# Patient Record
Sex: Female | Born: 1935 | Race: White | Hispanic: No | Marital: Married | Smoking: Former smoker
Health system: Southern US, Community
[De-identification: ages and names within clinical notes are randomized; demographics above are authoritative.]

## PROBLEM LIST (undated history)

## (undated) DIAGNOSIS — I499 Cardiac arrhythmia, unspecified: Secondary | ICD-10-CM

## (undated) DIAGNOSIS — M4800 Spinal stenosis, site unspecified: Secondary | ICD-10-CM

## (undated) DIAGNOSIS — T8859XA Other complications of anesthesia, initial encounter: Secondary | ICD-10-CM

## (undated) DIAGNOSIS — I10 Essential (primary) hypertension: Secondary | ICD-10-CM

## (undated) DIAGNOSIS — T4145XA Adverse effect of unspecified anesthetic, initial encounter: Secondary | ICD-10-CM

## (undated) DIAGNOSIS — R51 Headache: Secondary | ICD-10-CM

## (undated) DIAGNOSIS — Z950 Presence of cardiac pacemaker: Secondary | ICD-10-CM

## (undated) DIAGNOSIS — M1990 Unspecified osteoarthritis, unspecified site: Secondary | ICD-10-CM

## (undated) DIAGNOSIS — C801 Malignant (primary) neoplasm, unspecified: Secondary | ICD-10-CM

## (undated) DIAGNOSIS — R001 Bradycardia, unspecified: Secondary | ICD-10-CM

## (undated) HISTORY — DX: Spinal stenosis, site unspecified: M48.00

## (undated) HISTORY — PX: ABDOMINAL HYSTERECTOMY: SHX81

## (undated) HISTORY — PX: TONSILLECTOMY: SUR1361

## (undated) HISTORY — PX: BACK SURGERY: SHX140

## (undated) HISTORY — PX: HEMORRHOID SURGERY: SHX153

## (undated) HISTORY — PX: CATARACT EXTRACTION: SUR2

## (undated) HISTORY — PX: FOOT SURGERY: SHX648

## (undated) HISTORY — PX: INSERT / REPLACE / REMOVE PACEMAKER: SUR710

## (undated) HISTORY — PX: TUBAL LIGATION: SHX77

---

## 1998-06-09 ENCOUNTER — Other Ambulatory Visit: Admission: RE | Admit: 1998-06-09 | Discharge: 1998-06-09 | Payer: Self-pay | Admitting: Podiatry

## 1999-08-05 ENCOUNTER — Other Ambulatory Visit: Admission: RE | Admit: 1999-08-05 | Discharge: 1999-08-05 | Payer: Self-pay | Admitting: *Deleted

## 1999-11-19 ENCOUNTER — Encounter: Payer: Self-pay | Admitting: Obstetrics and Gynecology

## 1999-11-19 ENCOUNTER — Encounter: Admission: RE | Admit: 1999-11-19 | Discharge: 1999-11-19 | Payer: Self-pay | Admitting: Obstetrics and Gynecology

## 2000-06-14 ENCOUNTER — Encounter: Payer: Self-pay | Admitting: Internal Medicine

## 2000-11-01 ENCOUNTER — Other Ambulatory Visit: Admission: RE | Admit: 2000-11-01 | Discharge: 2000-11-01 | Payer: Self-pay | Admitting: Obstetrics and Gynecology

## 2000-11-22 ENCOUNTER — Encounter: Admission: RE | Admit: 2000-11-22 | Discharge: 2000-11-22 | Payer: Self-pay | Admitting: Obstetrics and Gynecology

## 2000-11-22 ENCOUNTER — Encounter: Payer: Self-pay | Admitting: Obstetrics and Gynecology

## 2001-11-05 ENCOUNTER — Other Ambulatory Visit: Admission: RE | Admit: 2001-11-05 | Discharge: 2001-11-05 | Payer: Self-pay | Admitting: Obstetrics and Gynecology

## 2001-12-04 ENCOUNTER — Encounter: Payer: Self-pay | Admitting: Obstetrics and Gynecology

## 2001-12-04 ENCOUNTER — Encounter: Admission: RE | Admit: 2001-12-04 | Discharge: 2001-12-04 | Payer: Self-pay | Admitting: Obstetrics and Gynecology

## 2002-11-08 ENCOUNTER — Other Ambulatory Visit: Admission: RE | Admit: 2002-11-08 | Discharge: 2002-11-08 | Payer: Self-pay | Admitting: Obstetrics and Gynecology

## 2002-12-11 ENCOUNTER — Encounter: Payer: Self-pay | Admitting: Obstetrics and Gynecology

## 2002-12-11 ENCOUNTER — Encounter: Admission: RE | Admit: 2002-12-11 | Discharge: 2002-12-11 | Payer: Self-pay | Admitting: Obstetrics and Gynecology

## 2003-07-29 ENCOUNTER — Encounter: Payer: Self-pay | Admitting: Internal Medicine

## 2003-11-11 ENCOUNTER — Other Ambulatory Visit: Admission: RE | Admit: 2003-11-11 | Discharge: 2003-11-11 | Payer: Self-pay | Admitting: Obstetrics and Gynecology

## 2003-12-26 ENCOUNTER — Encounter: Admission: RE | Admit: 2003-12-26 | Discharge: 2003-12-26 | Payer: Self-pay | Admitting: Obstetrics and Gynecology

## 2004-07-15 ENCOUNTER — Ambulatory Visit: Payer: Self-pay | Admitting: Internal Medicine

## 2004-09-08 ENCOUNTER — Ambulatory Visit: Payer: Self-pay | Admitting: Internal Medicine

## 2004-12-28 ENCOUNTER — Encounter: Admission: RE | Admit: 2004-12-28 | Discharge: 2004-12-28 | Payer: Self-pay | Admitting: Internal Medicine

## 2005-01-06 ENCOUNTER — Ambulatory Visit: Payer: Self-pay | Admitting: Internal Medicine

## 2005-06-09 ENCOUNTER — Ambulatory Visit: Payer: Self-pay | Admitting: Internal Medicine

## 2005-09-12 ENCOUNTER — Ambulatory Visit: Payer: Self-pay | Admitting: Internal Medicine

## 2005-09-16 ENCOUNTER — Ambulatory Visit: Payer: Self-pay

## 2005-09-16 ENCOUNTER — Encounter: Payer: Self-pay | Admitting: Cardiology

## 2005-10-04 ENCOUNTER — Encounter: Admission: RE | Admit: 2005-10-04 | Discharge: 2005-10-04 | Payer: Self-pay | Admitting: Internal Medicine

## 2005-11-16 ENCOUNTER — Other Ambulatory Visit: Admission: RE | Admit: 2005-11-16 | Discharge: 2005-11-16 | Payer: Self-pay | Admitting: Obstetrics & Gynecology

## 2005-11-30 ENCOUNTER — Ambulatory Visit: Payer: Self-pay | Admitting: Internal Medicine

## 2005-12-05 ENCOUNTER — Ambulatory Visit: Payer: Self-pay | Admitting: Gastroenterology

## 2005-12-15 ENCOUNTER — Encounter: Admission: RE | Admit: 2005-12-15 | Discharge: 2005-12-15 | Payer: Self-pay | Admitting: Internal Medicine

## 2006-01-03 ENCOUNTER — Encounter: Admission: RE | Admit: 2006-01-03 | Discharge: 2006-01-03 | Payer: Self-pay | Admitting: Obstetrics & Gynecology

## 2006-08-29 HISTORY — PX: POLYPECTOMY: SHX149

## 2006-09-29 ENCOUNTER — Ambulatory Visit: Payer: Self-pay | Admitting: Internal Medicine

## 2006-09-29 LAB — CONVERTED CEMR LAB
ALT: 12 units/L (ref 0–40)
AST: 22 units/L (ref 0–37)
Albumin: 3.9 g/dL (ref 3.5–5.2)
Alkaline Phosphatase: 51 units/L (ref 39–117)
BUN: 12 mg/dL (ref 6–23)
Basophils Absolute: 0.1 10*3/uL (ref 0.0–0.1)
Basophils Relative: 0.8 % (ref 0.0–1.0)
Bilirubin, Direct: 0.1 mg/dL (ref 0.0–0.3)
CO2: 31 meq/L (ref 19–32)
Calcium: 9.5 mg/dL (ref 8.4–10.5)
Chloride: 104 meq/L (ref 96–112)
Cholesterol: 228 mg/dL (ref 0–200)
Creatinine, Ser: 0.7 mg/dL (ref 0.4–1.2)
Direct LDL: 117.5 mg/dL
Eosinophils Absolute: 0.1 10*3/uL (ref 0.0–0.6)
Eosinophils Relative: 1 % (ref 0.0–5.0)
GFR calc Af Amer: 107 mL/min
GFR calc non Af Amer: 88 mL/min
Glucose, Bld: 81 mg/dL (ref 70–99)
HCT: 39.5 % (ref 36.0–46.0)
HDL: 81.8 mg/dL (ref >39.0)
Hemoglobin: 13.8 g/dL (ref 12.0–15.0)
Lymphocytes Relative: 30.6 % (ref 12.0–46.0)
MCHC: 34.9 g/dL (ref 30.0–36.0)
MCV: 86.9 fL (ref 78.0–100.0)
Monocytes Absolute: 0.8 10*3/uL — ABNORMAL HIGH (ref 0.2–0.7)
Monocytes Relative: 6.7 % (ref 3.0–11.0)
Neutro Abs: 6.8 10*3/uL (ref 1.4–7.7)
Neutrophils Relative %: 60.9 % (ref 43.0–77.0)
Platelets: 213 10*3/uL (ref 150–400)
Potassium: 4 meq/L (ref 3.5–5.1)
RBC: 4.55 M/uL (ref 3.87–5.11)
RDW: 13 % (ref 11.5–14.6)
Sodium: 142 meq/L (ref 135–145)
TSH: 4.35 microintl units/mL (ref 0.35–5.50)
Total Bilirubin: 0.8 mg/dL (ref 0.3–1.2)
Total CHOL/HDL Ratio: 2.8
Total Protein: 6.5 g/dL (ref 6.0–8.3)
Triglycerides: 114 mg/dL (ref 0–149)
VLDL: 23 mg/dL (ref 0–40)
WBC: 11.3 10*3/uL — ABNORMAL HIGH (ref 4.5–10.5)

## 2006-10-18 ENCOUNTER — Ambulatory Visit: Payer: Self-pay | Admitting: Gastroenterology

## 2006-11-13 ENCOUNTER — Encounter: Payer: Self-pay | Admitting: Internal Medicine

## 2006-11-13 ENCOUNTER — Ambulatory Visit: Payer: Self-pay | Admitting: Gastroenterology

## 2006-11-13 ENCOUNTER — Encounter (INDEPENDENT_AMBULATORY_CARE_PROVIDER_SITE_OTHER): Payer: Self-pay | Admitting: Specialist

## 2006-11-29 ENCOUNTER — Other Ambulatory Visit: Admission: RE | Admit: 2006-11-29 | Discharge: 2006-11-29 | Payer: Self-pay | Admitting: Obstetrics & Gynecology

## 2007-01-04 DIAGNOSIS — M129 Arthropathy, unspecified: Secondary | ICD-10-CM | POA: Insufficient documentation

## 2007-01-04 DIAGNOSIS — G43009 Migraine without aura, not intractable, without status migrainosus: Secondary | ICD-10-CM | POA: Insufficient documentation

## 2007-01-04 DIAGNOSIS — N840 Polyp of corpus uteri: Secondary | ICD-10-CM | POA: Insufficient documentation

## 2007-01-04 DIAGNOSIS — Z9089 Acquired absence of other organs: Secondary | ICD-10-CM | POA: Insufficient documentation

## 2007-01-09 ENCOUNTER — Encounter: Admission: RE | Admit: 2007-01-09 | Discharge: 2007-01-09 | Payer: Self-pay | Admitting: Obstetrics & Gynecology

## 2007-01-10 ENCOUNTER — Encounter: Payer: Self-pay | Admitting: Internal Medicine

## 2007-01-10 ENCOUNTER — Ambulatory Visit: Payer: Self-pay | Admitting: Internal Medicine

## 2007-03-08 ENCOUNTER — Telehealth (INDEPENDENT_AMBULATORY_CARE_PROVIDER_SITE_OTHER): Payer: Self-pay | Admitting: *Deleted

## 2007-04-18 ENCOUNTER — Encounter: Payer: Self-pay | Admitting: Internal Medicine

## 2007-05-19 ENCOUNTER — Encounter: Payer: Self-pay | Admitting: Internal Medicine

## 2007-06-05 ENCOUNTER — Ambulatory Visit: Payer: Self-pay | Admitting: Internal Medicine

## 2007-06-05 DIAGNOSIS — R635 Abnormal weight gain: Secondary | ICD-10-CM | POA: Insufficient documentation

## 2007-06-05 DIAGNOSIS — M2550 Pain in unspecified joint: Secondary | ICD-10-CM | POA: Insufficient documentation

## 2007-06-05 LAB — CONVERTED CEMR LAB: Anti Nuclear Antibody(ANA): NEGATIVE

## 2007-06-08 ENCOUNTER — Encounter (INDEPENDENT_AMBULATORY_CARE_PROVIDER_SITE_OTHER): Payer: Self-pay | Admitting: *Deleted

## 2007-06-08 LAB — CONVERTED CEMR LAB
ALT: 18 units/L (ref 0–35)
Albumin: 4.1 g/dL (ref 3.5–5.2)
Alkaline Phosphatase: 61 units/L (ref 39–117)
Bilirubin, Direct: 0.1 mg/dL (ref 0.0–0.3)
Eosinophils Absolute: 0.2 10*3/uL (ref 0.0–0.6)
Lymphocytes Relative: 23 % (ref 12.0–46.0)
MCHC: 34.2 g/dL (ref 30.0–36.0)
MCV: 86.8 fL (ref 78.0–100.0)
Monocytes Absolute: 0.2 10*3/uL (ref 0.2–0.7)
Neutro Abs: 6.2 10*3/uL (ref 1.4–7.7)
Neutrophils Relative %: 72.2 % (ref 43.0–77.0)
Platelets: 203 10*3/uL (ref 150–400)
RBC: 4.45 M/uL (ref 3.87–5.11)
Total Bilirubin: 0.7 mg/dL (ref 0.3–1.2)
Total Protein: 7 g/dL (ref 6.0–8.3)

## 2007-07-11 ENCOUNTER — Encounter: Payer: Self-pay | Admitting: Internal Medicine

## 2007-12-07 ENCOUNTER — Other Ambulatory Visit: Admission: RE | Admit: 2007-12-07 | Discharge: 2007-12-07 | Payer: Self-pay | Admitting: Obstetrics and Gynecology

## 2007-12-10 ENCOUNTER — Ambulatory Visit: Payer: Self-pay | Admitting: Internal Medicine

## 2007-12-10 ENCOUNTER — Encounter (INDEPENDENT_AMBULATORY_CARE_PROVIDER_SITE_OTHER): Payer: Self-pay | Admitting: *Deleted

## 2007-12-10 DIAGNOSIS — R0989 Other specified symptoms and signs involving the circulatory and respiratory systems: Secondary | ICD-10-CM | POA: Insufficient documentation

## 2007-12-10 DIAGNOSIS — R0609 Other forms of dyspnea: Secondary | ICD-10-CM | POA: Insufficient documentation

## 2007-12-10 DIAGNOSIS — R609 Edema, unspecified: Secondary | ICD-10-CM | POA: Insufficient documentation

## 2007-12-11 ENCOUNTER — Ambulatory Visit: Payer: Self-pay | Admitting: Internal Medicine

## 2007-12-13 ENCOUNTER — Encounter (INDEPENDENT_AMBULATORY_CARE_PROVIDER_SITE_OTHER): Payer: Self-pay | Admitting: *Deleted

## 2007-12-17 ENCOUNTER — Encounter (INDEPENDENT_AMBULATORY_CARE_PROVIDER_SITE_OTHER): Payer: Self-pay | Admitting: *Deleted

## 2007-12-27 ENCOUNTER — Encounter: Payer: Self-pay | Admitting: Internal Medicine

## 2007-12-27 ENCOUNTER — Ambulatory Visit: Payer: Self-pay | Admitting: Cardiology

## 2007-12-27 ENCOUNTER — Ambulatory Visit: Payer: Self-pay

## 2007-12-28 ENCOUNTER — Telehealth (INDEPENDENT_AMBULATORY_CARE_PROVIDER_SITE_OTHER): Payer: Self-pay | Admitting: *Deleted

## 2007-12-28 ENCOUNTER — Encounter (INDEPENDENT_AMBULATORY_CARE_PROVIDER_SITE_OTHER): Payer: Self-pay | Admitting: *Deleted

## 2008-01-03 ENCOUNTER — Ambulatory Visit: Payer: Self-pay

## 2008-01-03 ENCOUNTER — Ambulatory Visit: Payer: Self-pay | Admitting: Cardiology

## 2008-01-03 LAB — CONVERTED CEMR LAB
Cholesterol: 227 mg/dL (ref 0–200)
Direct LDL: 128.3 mg/dL
HDL: 70.8 mg/dL (ref >39.0)
Triglycerides: 110 mg/dL (ref 0–149)

## 2008-01-10 ENCOUNTER — Encounter: Admission: RE | Admit: 2008-01-10 | Discharge: 2008-01-10 | Payer: Self-pay | Admitting: Obstetrics & Gynecology

## 2008-06-09 ENCOUNTER — Telehealth (INDEPENDENT_AMBULATORY_CARE_PROVIDER_SITE_OTHER): Payer: Self-pay | Admitting: *Deleted

## 2008-06-16 ENCOUNTER — Telehealth (INDEPENDENT_AMBULATORY_CARE_PROVIDER_SITE_OTHER): Payer: Self-pay | Admitting: *Deleted

## 2008-07-28 ENCOUNTER — Ambulatory Visit: Payer: Self-pay | Admitting: Internal Medicine

## 2008-07-28 DIAGNOSIS — I359 Nonrheumatic aortic valve disorder, unspecified: Secondary | ICD-10-CM | POA: Insufficient documentation

## 2008-07-28 DIAGNOSIS — Z860100 Personal history of colon polyps, unspecified: Secondary | ICD-10-CM | POA: Insufficient documentation

## 2008-07-28 DIAGNOSIS — I10 Essential (primary) hypertension: Secondary | ICD-10-CM | POA: Insufficient documentation

## 2008-07-28 DIAGNOSIS — I080 Rheumatic disorders of both mitral and aortic valves: Secondary | ICD-10-CM | POA: Insufficient documentation

## 2008-07-28 LAB — CONVERTED CEMR LAB: HDL goal, serum: 40 mg/dL

## 2008-07-31 ENCOUNTER — Ambulatory Visit: Payer: Self-pay | Admitting: Internal Medicine

## 2008-07-31 LAB — CONVERTED CEMR LAB
Blood in Urine, dipstick: NEGATIVE
Protein, U semiquant: NEGATIVE
Specific Gravity, Urine: 1.02
Urobilinogen, UA: 0.2
WBC Urine, dipstick: NEGATIVE
pH: 5

## 2008-08-01 ENCOUNTER — Encounter (INDEPENDENT_AMBULATORY_CARE_PROVIDER_SITE_OTHER): Payer: Self-pay | Admitting: *Deleted

## 2008-08-09 LAB — CONVERTED CEMR LAB
ALT: 13 units/L (ref 0–35)
AST: 24 units/L (ref 0–37)
Albumin: 3.9 g/dL (ref 3.5–5.2)
BUN: 23 mg/dL (ref 6–23)
CO2: 30 meq/L (ref 19–32)
Chloride: 110 meq/L (ref 96–112)
Cholesterol: 203 mg/dL (ref 0–200)
Creatinine, Ser: 0.9 mg/dL (ref 0.4–1.2)
Direct LDL: 106.5 mg/dL
GFR calc Af Amer: 80 mL/min
Glucose, Bld: 110 mg/dL — ABNORMAL HIGH (ref 70–99)
HCT: 36.7 % (ref 36.0–46.0)
Hemoglobin: 12.7 g/dL (ref 12.0–15.0)
Hgb A1c MFr Bld: 6 % (ref 4.6–6.0)
Lymphocytes Relative: 36.5 % (ref 12.0–46.0)
MCHC: 34.6 g/dL (ref 30.0–36.0)
MCV: 85.8 fL (ref 78.0–100.0)
Monocytes Relative: 8.8 % (ref 3.0–12.0)
Neutrophils Relative %: 52.8 % (ref 43.0–77.0)
RDW: 12.9 % (ref 11.5–14.6)
Sodium: 144 meq/L (ref 135–145)
TSH: 2.23 microintl units/mL (ref 0.35–5.50)
Total CHOL/HDL Ratio: 3.1
Triglycerides: 89 mg/dL (ref 0–149)

## 2008-08-11 ENCOUNTER — Encounter (INDEPENDENT_AMBULATORY_CARE_PROVIDER_SITE_OTHER): Payer: Self-pay | Admitting: *Deleted

## 2008-10-17 ENCOUNTER — Ambulatory Visit: Payer: Self-pay | Admitting: Cardiology

## 2008-10-31 ENCOUNTER — Ambulatory Visit: Payer: Self-pay | Admitting: Emergency Medicine

## 2008-12-08 ENCOUNTER — Ambulatory Visit: Payer: Self-pay | Admitting: Emergency Medicine

## 2008-12-08 DIAGNOSIS — J4489 Other specified chronic obstructive pulmonary disease: Secondary | ICD-10-CM | POA: Insufficient documentation

## 2008-12-08 DIAGNOSIS — J449 Chronic obstructive pulmonary disease, unspecified: Secondary | ICD-10-CM | POA: Insufficient documentation

## 2008-12-11 ENCOUNTER — Other Ambulatory Visit: Admission: RE | Admit: 2008-12-11 | Discharge: 2008-12-11 | Payer: Self-pay | Admitting: Obstetrics and Gynecology

## 2009-01-20 ENCOUNTER — Encounter: Admission: RE | Admit: 2009-01-20 | Discharge: 2009-01-20 | Payer: Self-pay | Admitting: Obstetrics & Gynecology

## 2009-01-21 ENCOUNTER — Ambulatory Visit: Payer: Self-pay | Admitting: Internal Medicine

## 2009-08-29 DIAGNOSIS — R001 Bradycardia, unspecified: Secondary | ICD-10-CM

## 2009-08-29 HISTORY — DX: Bradycardia, unspecified: R00.1

## 2009-09-16 ENCOUNTER — Ambulatory Visit: Payer: Self-pay | Admitting: Internal Medicine

## 2009-09-16 DIAGNOSIS — E785 Hyperlipidemia, unspecified: Secondary | ICD-10-CM | POA: Insufficient documentation

## 2009-09-16 DIAGNOSIS — R5383 Other fatigue: Secondary | ICD-10-CM

## 2009-09-16 DIAGNOSIS — R5381 Other malaise: Secondary | ICD-10-CM | POA: Insufficient documentation

## 2009-09-16 DIAGNOSIS — Z85828 Personal history of other malignant neoplasm of skin: Secondary | ICD-10-CM | POA: Insufficient documentation

## 2009-09-16 DIAGNOSIS — E039 Hypothyroidism, unspecified: Secondary | ICD-10-CM | POA: Insufficient documentation

## 2009-09-22 ENCOUNTER — Ambulatory Visit: Payer: Self-pay | Admitting: Internal Medicine

## 2009-10-05 ENCOUNTER — Encounter: Payer: Self-pay | Admitting: Internal Medicine

## 2009-10-05 ENCOUNTER — Telehealth (INDEPENDENT_AMBULATORY_CARE_PROVIDER_SITE_OTHER): Payer: Self-pay | Admitting: *Deleted

## 2009-10-06 ENCOUNTER — Encounter (INDEPENDENT_AMBULATORY_CARE_PROVIDER_SITE_OTHER): Payer: Self-pay | Admitting: *Deleted

## 2009-10-07 ENCOUNTER — Telehealth (INDEPENDENT_AMBULATORY_CARE_PROVIDER_SITE_OTHER): Payer: Self-pay | Admitting: *Deleted

## 2009-10-16 ENCOUNTER — Ambulatory Visit: Payer: Self-pay | Admitting: Internal Medicine

## 2009-10-16 LAB — CONVERTED CEMR LAB
BUN: 14 mg/dL (ref 6–23)
Basophils Relative: 0.9 % (ref 0.0–3.0)
Creatinine, Ser: 0.9 mg/dL (ref 0.4–1.2)
GFR calc non Af Amer: 65.48 mL/min (ref >60)
Glucose, Bld: 116 mg/dL — ABNORMAL HIGH (ref 70–99)
HCT: 39.8 % (ref 36.0–46.0)
Lymphocytes Relative: 32.6 % (ref 12.0–46.0)
Lymphs Abs: 2.2 10*3/uL (ref 0.7–4.0)
Monocytes Absolute: 0.6 10*3/uL (ref 0.1–1.0)
Monocytes Relative: 8.1 % (ref 3.0–12.0)
Neutro Abs: 3.9 10*3/uL (ref 1.4–7.7)
RBC: 4.43 M/uL (ref 3.87–5.11)
WBC: 6.9 10*3/uL (ref 4.5–10.5)
aPTT: 27.6 s (ref 21.7–28.8)

## 2009-10-23 ENCOUNTER — Ambulatory Visit: Payer: Self-pay | Admitting: Internal Medicine

## 2009-10-23 ENCOUNTER — Inpatient Hospital Stay (HOSPITAL_COMMUNITY): Admission: RE | Admit: 2009-10-23 | Discharge: 2009-10-24 | Payer: Self-pay | Admitting: Internal Medicine

## 2009-10-24 ENCOUNTER — Encounter: Payer: Self-pay | Admitting: Internal Medicine

## 2009-11-09 ENCOUNTER — Ambulatory Visit: Payer: Self-pay

## 2009-11-09 ENCOUNTER — Encounter: Payer: Self-pay | Admitting: Internal Medicine

## 2009-11-27 ENCOUNTER — Telehealth: Payer: Self-pay | Admitting: Internal Medicine

## 2009-12-04 ENCOUNTER — Encounter (INDEPENDENT_AMBULATORY_CARE_PROVIDER_SITE_OTHER): Payer: Self-pay | Admitting: *Deleted

## 2009-12-08 ENCOUNTER — Encounter: Payer: Self-pay | Admitting: Internal Medicine

## 2009-12-08 ENCOUNTER — Ambulatory Visit: Payer: Self-pay

## 2009-12-09 ENCOUNTER — Ambulatory Visit: Payer: Self-pay | Admitting: Gastroenterology

## 2009-12-21 ENCOUNTER — Ambulatory Visit: Payer: Self-pay | Admitting: Sports Medicine

## 2009-12-21 DIAGNOSIS — M25559 Pain in unspecified hip: Secondary | ICD-10-CM | POA: Insufficient documentation

## 2009-12-21 DIAGNOSIS — R269 Unspecified abnormalities of gait and mobility: Secondary | ICD-10-CM | POA: Insufficient documentation

## 2009-12-23 ENCOUNTER — Ambulatory Visit: Payer: Self-pay | Admitting: Gastroenterology

## 2009-12-25 ENCOUNTER — Encounter: Payer: Self-pay | Admitting: Gastroenterology

## 2010-01-01 DIAGNOSIS — Z950 Presence of cardiac pacemaker: Secondary | ICD-10-CM | POA: Insufficient documentation

## 2010-01-05 ENCOUNTER — Ambulatory Visit: Payer: Self-pay | Admitting: Internal Medicine

## 2010-01-05 DIAGNOSIS — I4892 Unspecified atrial flutter: Secondary | ICD-10-CM | POA: Insufficient documentation

## 2010-01-27 ENCOUNTER — Telehealth: Payer: Self-pay | Admitting: Cardiology

## 2010-01-28 ENCOUNTER — Telehealth: Payer: Self-pay | Admitting: Cardiology

## 2010-02-10 ENCOUNTER — Encounter: Payer: Self-pay | Admitting: Cardiology

## 2010-02-23 ENCOUNTER — Telehealth: Payer: Self-pay | Admitting: Cardiology

## 2010-02-23 ENCOUNTER — Encounter: Payer: Self-pay | Admitting: Cardiology

## 2010-03-19 ENCOUNTER — Telehealth: Payer: Self-pay | Admitting: Cardiology

## 2010-03-19 ENCOUNTER — Encounter: Payer: Self-pay | Admitting: Internal Medicine

## 2010-03-22 ENCOUNTER — Encounter: Payer: Self-pay | Admitting: Cardiology

## 2010-04-08 ENCOUNTER — Ambulatory Visit: Payer: Self-pay | Admitting: Internal Medicine

## 2010-04-08 ENCOUNTER — Telehealth: Payer: Self-pay | Admitting: Cardiology

## 2010-04-13 ENCOUNTER — Encounter: Payer: Self-pay | Admitting: Cardiology

## 2010-04-16 ENCOUNTER — Telehealth: Payer: Self-pay | Admitting: Cardiology

## 2010-04-19 ENCOUNTER — Encounter: Payer: Self-pay | Admitting: Internal Medicine

## 2010-05-24 ENCOUNTER — Ambulatory Visit: Payer: Self-pay | Admitting: Internal Medicine

## 2010-05-24 DIAGNOSIS — M1710 Unilateral primary osteoarthritis, unspecified knee: Secondary | ICD-10-CM

## 2010-05-24 DIAGNOSIS — IMO0002 Reserved for concepts with insufficient information to code with codable children: Secondary | ICD-10-CM | POA: Insufficient documentation

## 2010-05-24 DIAGNOSIS — M545 Low back pain, unspecified: Secondary | ICD-10-CM | POA: Insufficient documentation

## 2010-05-24 DIAGNOSIS — N259 Disorder resulting from impaired renal tubular function, unspecified: Secondary | ICD-10-CM | POA: Insufficient documentation

## 2010-05-26 ENCOUNTER — Encounter: Payer: Self-pay | Admitting: Internal Medicine

## 2010-05-26 ENCOUNTER — Telehealth (INDEPENDENT_AMBULATORY_CARE_PROVIDER_SITE_OTHER): Payer: Self-pay | Admitting: *Deleted

## 2010-05-26 LAB — CONVERTED CEMR LAB
AST: 26 units/L (ref 0–37)
BUN: 44 mg/dL — ABNORMAL HIGH (ref 6–23)
Basophils Absolute: 0.1 10*3/uL (ref 0.0–0.1)
Bilirubin, Direct: 0.1 mg/dL (ref 0.0–0.3)
Calcium: 10.2 mg/dL (ref 8.4–10.5)
Creatinine, Ser: 1.6 mg/dL — ABNORMAL HIGH (ref 0.4–1.2)
Eosinophils Relative: 1.8 % (ref 0.0–5.0)
Glucose, Bld: 99 mg/dL (ref 70–99)
HCT: 34.1 % — ABNORMAL LOW (ref 36.0–46.0)
Lymphocytes Relative: 30.7 % (ref 12.0–46.0)
Lymphs Abs: 3.8 10*3/uL (ref 0.7–4.0)
MCHC: 33.8 g/dL (ref 30.0–36.0)
Neutrophils Relative %: 59.9 % (ref 43.0–77.0)
Platelets: 251 10*3/uL (ref 150.0–400.0)
Potassium: 4.8 meq/L (ref 3.5–5.1)
RBC: 3.84 M/uL — ABNORMAL LOW (ref 3.87–5.11)
RDW: 13.4 % (ref 11.5–14.6)
Total Bilirubin: 0.3 mg/dL (ref 0.3–1.2)
Total Protein: 7.3 g/dL (ref 6.0–8.3)

## 2010-05-27 ENCOUNTER — Encounter: Admission: RE | Admit: 2010-05-27 | Discharge: 2010-05-27 | Payer: Self-pay | Admitting: Internal Medicine

## 2010-05-29 ENCOUNTER — Encounter: Payer: Self-pay | Admitting: Internal Medicine

## 2010-06-03 ENCOUNTER — Encounter: Payer: Self-pay | Admitting: Internal Medicine

## 2010-06-07 ENCOUNTER — Ambulatory Visit: Payer: Self-pay | Admitting: Internal Medicine

## 2010-06-08 ENCOUNTER — Encounter: Admission: RE | Admit: 2010-06-08 | Discharge: 2010-06-08 | Payer: Self-pay | Admitting: Obstetrics & Gynecology

## 2010-06-22 ENCOUNTER — Ambulatory Visit: Payer: Self-pay | Admitting: Internal Medicine

## 2010-06-22 ENCOUNTER — Encounter: Payer: Self-pay | Admitting: Cardiovascular Disease

## 2010-06-23 ENCOUNTER — Ambulatory Visit: Payer: Self-pay

## 2010-06-23 ENCOUNTER — Encounter: Payer: Self-pay | Admitting: Cardiovascular Disease

## 2010-06-23 ENCOUNTER — Encounter: Payer: Self-pay | Admitting: Internal Medicine

## 2010-07-01 ENCOUNTER — Encounter: Payer: Self-pay | Admitting: Internal Medicine

## 2010-07-08 ENCOUNTER — Encounter
Admission: RE | Admit: 2010-07-08 | Discharge: 2010-07-08 | Payer: Self-pay | Admitting: Physical Medicine and Rehabilitation

## 2010-08-10 ENCOUNTER — Encounter: Payer: Self-pay | Admitting: Internal Medicine

## 2010-08-10 ENCOUNTER — Telehealth: Payer: Self-pay | Admitting: Internal Medicine

## 2010-08-24 ENCOUNTER — Encounter: Payer: Self-pay | Admitting: Cardiovascular Disease

## 2010-09-16 ENCOUNTER — Encounter: Payer: Self-pay | Admitting: Internal Medicine

## 2010-09-20 ENCOUNTER — Ambulatory Visit
Admission: RE | Admit: 2010-09-20 | Discharge: 2010-09-20 | Payer: Self-pay | Source: Home / Self Care | Attending: Internal Medicine | Admitting: Internal Medicine

## 2010-09-22 ENCOUNTER — Other Ambulatory Visit: Payer: Self-pay | Admitting: Internal Medicine

## 2010-09-22 ENCOUNTER — Ambulatory Visit
Admission: RE | Admit: 2010-09-22 | Discharge: 2010-09-22 | Payer: Self-pay | Source: Home / Self Care | Attending: Internal Medicine | Admitting: Internal Medicine

## 2010-09-22 LAB — BASIC METABOLIC PANEL
BUN: 14 mg/dL (ref 6–23)
CO2: 30 mEq/L (ref 19–32)
Calcium: 9.9 mg/dL (ref 8.4–10.5)
Creatinine, Ser: 1 mg/dL (ref 0.4–1.2)
GFR: 60.63 mL/min (ref >60.00)
Potassium: 4.4 mEq/L (ref 3.5–5.1)
Sodium: 140 mEq/L (ref 135–145)

## 2010-09-22 LAB — HEPATIC FUNCTION PANEL
ALT: 10 U/L (ref 0–35)
AST: 21 U/L (ref 0–37)
Total Protein: 6.8 g/dL (ref 6.0–8.3)

## 2010-09-22 LAB — LIPID PANEL
HDL: 83.9 mg/dL (ref >39.00)
Triglycerides: 114 mg/dL (ref 0.0–149.0)

## 2010-09-22 LAB — CBC WITH DIFFERENTIAL/PLATELET
Eosinophils Relative: 2.1 % (ref 0.0–5.0)
HCT: 36.9 % (ref 36.0–46.0)
Lymphs Abs: 2.1 10*3/uL (ref 0.7–4.0)
Monocytes Relative: 10.3 % (ref 3.0–12.0)
Platelets: 219 10*3/uL (ref 150.0–400.0)
WBC: 7.6 10*3/uL (ref 4.5–10.5)

## 2010-09-26 LAB — CONVERTED CEMR LAB
ALT: 13 units/L (ref 0–35)
AST: 23 units/L (ref 0–37)
AST: 28 units/L (ref 0–37)
Albumin: 4.6 g/dL (ref 3.5–5.2)
Alkaline Phosphatase: 57 units/L (ref 39–117)
BUN: 23 mg/dL (ref 6–23)
Basophils Relative: 0.9 % (ref 0.0–3.0)
Bilirubin, Direct: 0.1 mg/dL (ref 0.0–0.3)
Calcium: 10.3 mg/dL (ref 8.4–10.5)
Cholesterol: 270 mg/dL — ABNORMAL HIGH (ref 0–200)
Creatinine, Ser: 0.9 mg/dL (ref 0.4–1.2)
Creatinine, Ser: 1 mg/dL (ref 0.4–1.2)
Eosinophils Relative: 1.1 % (ref 0.0–5.0)
Glucose, Bld: 110 mg/dL — ABNORMAL HIGH (ref 70–99)
HDL: 96 mg/dL (ref >39.00)
Hemoglobin: 13.7 g/dL (ref 12.0–15.0)
Hgb A1c MFr Bld: 5.8 % (ref 4.6–6.5)
Lymphs Abs: 3.1 10*3/uL (ref 0.7–4.0)
Monocytes Absolute: 0.8 10*3/uL (ref 0.1–1.0)
Monocytes Relative: 9 % (ref 3.0–12.0)
Neutrophils Relative %: 54.4 % (ref 43.0–77.0)
Platelets: 189 10*3/uL (ref 150.0–400.0)
RBC: 4.63 M/uL (ref 3.87–5.11)
Sodium: 145 meq/L (ref 135–145)
TSH: 2.32 microintl units/mL (ref 0.35–5.50)
Total Bilirubin: 1.4 mg/dL — ABNORMAL HIGH (ref 0.3–1.2)
Total Protein: 6.7 g/dL (ref 6.0–8.3)
Total Protein: 7.4 g/dL (ref 6.0–8.3)
Triglycerides: 122 mg/dL (ref 0.0–149.0)
WBC: 8.9 10*3/uL (ref 4.5–10.5)

## 2010-09-28 NOTE — Progress Notes (Signed)
Summary: can she use caffiene to help with fatigue   Phone Note Call from Patient Call back at Home Phone [phone] Call back at [phone]   Caller: Patient Reason for Call: Talk to Nurse, Lab or Test Results Initial call taken by: Lorne Skeens,  April 16, 2010 1:01 PM  Follow-up for Phone Call        pt states she has had a crappy summer related to fatigue,  thinks it is the Pradaxa, wanted to see if caffiene who help her with her fatigue so she took Excedrin and it has helped.  Wants to know if there is anything OTC she can take to help with her fatigue.  Discussed with pt that it is not a good idea to use caffiene with abnormal heart rhythm.  She also states to did a phone check for her pacemaker and hasn't heard anything back about it.  Will check with Paula/Kristen. Follow-up by: Charolotte Capuchin, RN,  April 16, 2010 1:42 PM  Additional Follow-up for Phone Call Additional follow up Details #1::        Discussed with the patient. Additional Follow-up by: Rollene Rotunda, MD, [facility],  April 20, 2010 6:15 PM

## 2010-09-28 NOTE — Medication Information (Signed)
Summary: Labs  Labs   Imported By: Debby Freiberg 02/25/2010 16:11:10  _____________________________________________________________________  External Attachment:    Type:   Image     Comment:   External Document

## 2010-09-28 NOTE — Progress Notes (Signed)
Summary: Rx for Torsemide / KCL  Medications Added TORSEMIDE 20 MG TABS (TORSEMIDE) one bid KLOR-CON M20 20 MEQ CR-TABS (POTASSIUM CHLORIDE CRYS CR) one daily      [facility] per orders, written order for BMET mail to address below.  Sander Nephew, RN ---- Converted from flag ---- ---- 01/27/2010 9:53 PM, Rollene Rotunda, MD, [facility] wrote: Will you please call in Torsemide 20 mg by mouth by mouth two times a day to [phone].  Also KCl 20 meq by mouth daily.  She needs an order for a BMET mailed to her  to be done within ten days.  Address [address] [place], [place],  [address]  Thanks.  Convert this to a saved note please.  Thanks ------------------------------       New/Updated Medications: TORSEMIDE 20 MG TABS (TORSEMIDE) one bid KLOR-CON M20 20 MEQ CR-TABS (POTASSIUM CHLORIDE CRYS CR) one daily

## 2010-09-28 NOTE — Progress Notes (Signed)
Summary: Order for TSH/Vit D   Phone Note Call from Patient Call back at [phone] / [phone]   Caller: Patient Reason for Call: Talk to Nurse, Talk to Doctor, Lab or Test Results Summary of Call: pt calling to get lab results  Initial call taken by: Omer Jack,  April 08, 2010 12:34 PM  Follow-up for Phone Call        Discussed with the patient Follow-up by: Rollene Rotunda, MD, [facility],  April 12, 2010 10:24 AM     Appended Document: pt calling to get lab results Verbal orders from Dr Antoine Poche - pt needs a TSH and VIT D level.  She will have this drawn in [place] - verbal order given to [facility].

## 2010-09-28 NOTE — Miscellaneous (Signed)
Summary: Lec previsit  Clinical Lists Changes  Medications: Added new medication of MOVIPREP 100 GM  SOLR (PEG-KCL-NACL-NASULF-NA ASC-C) As per prep instructions. - Signed Rx of MOVIPREP 100 GM  SOLR (PEG-KCL-NACL-NASULF-NA ASC-C) As per prep instructions.;  #1 x 0;  Signed;  Entered by: Karl Bales RN;  Authorized by: Mardella Layman MD [facility];  Method used: [facility] to [facility]  [phone]*, [address], [place], [place]  [address], Ph: [phone] or [phone], Fax: [phone]    Prescriptions: MOVIPREP 100 GM  SOLR (PEG-KCL-NACL-NASULF-NA ASC-C) As per prep instructions.  #1 x 0   Entered by:   Karl Bales RN   Authorized by:   Mardella Layman MD [facility]   Signed by:   Karl Bales RN on 12/09/2009   Method used:   [facility]ronically to        [facility]  [phone]* (retail)       [address] [place], [place]  [address]       Ph: [phone] or [phone]       Fax: [phone]   RxID:   [id number]

## 2010-09-28 NOTE — Progress Notes (Signed)
Summary: Lab Results  Phone Note Outgoing Call Call back at Home Phone [phone]   Call placed by: Shonna Chock CMA,  May 26, 2010 1:25 PM Call placed to: Patient Summary of Call: Spoke with patient, scheduled appointment to recheck labs 06/07/10, copy of labs mailed  Mild anemia present; this may be related to the kidney impairment which is slightly worse (creatinine was 1.35 in 02/2010).Recheck CBC & dif with iron panel, B12 & folate level in 10-14 days .White count is elevated but types of cells are normal.Please report any signs of infection. Ultrasound of kidneys & referral to Nephrologist recommended.Please stop any supplements until the Nephrology consultation is completed. Hopp (Codes for labs : 285.9, 780.79)  Shonna Chock CMA  May 26, 2010 1:33 PM n

## 2010-09-28 NOTE — Cardiovascular Report (Signed)
Summary: Office Visit   Office Visit   Imported By: Roderic Ovens 11/16/2009 15:16:52  _____________________________________________________________________  External Attachment:    Type:   Image     Comment:   External Document

## 2010-09-28 NOTE — Assessment & Plan Note (Signed)
Summary: NP,WALKING ISSUES; ORTHOTIC ADJUSTMENTS   Vital Signs:  Patient profile:   75 year old female Height:      63 inches Weight:      190 pounds BP sitting:   118 / 57  Vitals Entered By: Lillia Pauls CMA (December 21, 2009 11:22 AM)  Referring Provider:  Dr. Antoine Poche Primary Provider:  Dr. Alwyn Ren   History of Present Illness: With walking she gets pain on lat aspect of both thighs not past knee Dr Melvyn Neth, [place] has treated He has done manipulation and [place] which helps for awhile  orthotics have helped over the years  She comes for my impression of whether change in orthotic can help sxs also for other thoughts regarding hip pain  note she has had a stressful past 6 mos and is out of her normal exrecise pattern walking was better when she was able to consistently exercise  Has pacemaker for CHB Vacualr evaluations have been OK No hx of lumbar disk herniation  Allergies: 1)  ! Tylenol  Physical Exam  General:  Well-developed,well-nourished,in no acute distress; alert,appropriate and cooperative throughout examination Msk:  SLR norm bilat FABER unremarkable bilat Hip ROM is normal left and RT Hip strength is weak only on abduction and focal tenderness over glut medius MM area bilat  Feet are pronated bilat Gait shows that even in orthotics she still gets calcaneal valgus on left - this is her worst side orthtoics are well made and support the arch well however they do not have a heel counter on base   Impression & Recommendations:  Problem # 1:  HIP PAIN, BILATERAL (ICD-719.45)  Her updated medication list for this problem includes:    Bayer Aspirin 325 Mg Tabs (Aspirin) .Marland Kitchen... 1 once daily  I think this is affected by gait she does need to add some strength work for her hip abduction and hip rotation give HEP for this  Problem # 2:  ABNORMALITY OF GAIT (ICD-781.2) This is mostly corrected in current orthtoics However, we added a medial heel wedge to both this  improved her gait and lessened pronation and rear foot valgus  I also gave her some thinner felt insoles to use in dress shoes that won't tolerate custom orthotic  she can see me as needed  Can continue with Dr Melvyn Neth ofr treatment as needed  Complete Medication List: 1)  Furosemide 40 Mg Tabs (Furosemide) .Marland Kitchen.. 1 by mouth once daily and 1/2 once daily as needed 2)  Amlodipine Besylate 10 Mg Tabs (Amlodipine besylate) .... Take one tablet once daily 3)  Lisinopril 20 Mg Tabs (Lisinopril) .... Two times a day 4)  Klor-con M10 10 Meq Cr-tabs (Potassium chloride crys cr) .Marland Kitchen.. 1 once daily as needed, if extra half of lasix taken 5)  Bayer Aspirin 325 Mg Tabs (Aspirin) .Marland Kitchen.. 1 once daily 6)  Zyrtec Allergy 10 Mg Tabs (Cetirizine hcl) .... Once daily 7)  Estro Van  .... Qd 8)  Anti-oxidant Tabs (Multiple vitamin) .... Once daily 9)  Caltrate 600+d 600-400 Mg-unit Tabs (Calcium carbonate-vitamin d) .... 2 by mouth am, 1 by mouth pm 10)  Sam-e 400 Mg Tabs (S-adenosylmethionine) .Marland Kitchen.. 1 by mouth once daily 11)  Moviprep 100 Gm Solr (Peg-kcl-nacl-nasulf-na asc-c) .... As per prep instructions.

## 2010-09-28 NOTE — Letter (Signed)
Summary: Patient Notice- Polyp Results  [facility]  [address] [place], [place] [address]   Phone: [phone]  Fax: [phone]        December 25, 2009 MRN: [id number]    Amy Dominguez [address] [place], [place]  [address]    Dear [name],  I am pleased to inform you that the colon polyp(s) removed during your recent colonoscopy was (were) found to be benign (no cancer detected) upon pathologic examination.  I recommend you have a repeat colonoscopy examination in 10_ years to look for recurrent polyps, as having colon polyps increases your risk for having recurrent polyps or even colon cancer in the future.  Should you develop new or worsening symptoms of abdominal pain, bowel habit changes or bleeding from the rectum or bowels, please schedule an evaluation with either your primary care physician or with me.  Additional information/recommendations:  xx__ No further action with gastroenterology is needed at this time. Please      follow-up with your primary care physician for your other healthcare      needs.  __ Please call [phone] to schedule a return visit to review your      situation.  __ Please keep your follow-up visit as already scheduled.  __ Continue treatment plan as outlined the day of your exam.  Please call us if you are having persistent problems or have questions about your condition that have not been fully answered at this time.  Sincerely,  Mardella Layman MD [facility]  This letter has been electronically signed by your physician.  Appended Document: Patient Notice- Polyp Results letter mailed 5.2.11

## 2010-09-28 NOTE — Progress Notes (Signed)
Summary: LAB ORDER   Phone Note Call from Patient Call back at [phone]   Reason for Call: Talk to Nurse Summary of Call: NEEDS LAB ORDER SENT TO [phone], PT REQUEST CALL AFTER ORDER SENT Initial call taken by: Migdalia Dk,  February 23, 2010 2:09 PM  Follow-up for Phone Call        ORDER FAXED TO NUMBER LISTED ON MESSAGE. PT AWARE. Follow-up by: Scherrie Bateman, LPN,  February 23, 2010 2:27 PM

## 2010-09-28 NOTE — Letter (Signed)
Summary: [facility] Instructions  [facility]  [address] [place], [place] [address]   Phone: [phone]  Fax: [phone]       SAMEKA BAGENT    04-13-1936    MRN: [id number]        Procedure Day Dorna Bloom:  [facility]  12/23/09     Arrival Time:  8:30AM     Procedure Time:  9:30AM     Location of Procedure:                    _X _  [facility] (4th Floor)                        PREPARATION FOR COLONOSCOPY WITH MOVIPREP   Starting 5 days prior to your procedure 12/18/09 do not eat nuts, seeds, popcorn, corn, beans, peas,  salads, or any raw vegetables.  Do not take any fiber supplements (e.g. Metamucil, Citrucel, and Benefiber).  THE DAY BEFORE YOUR PROCEDURE         DATE: 12/22/09  DAY: TUESDAY  1.  Drink clear liquids the entire day-NO SOLID FOOD  2.  Do not drink anything colored red or purple.  Avoid juices with pulp.  No orange juice.  3.  Drink at least 64 oz. (8 glasses) of fluid/clear liquids during the day to prevent dehydration and help the prep work efficiently.  CLEAR LIQUIDS INCLUDE: Water Jello Ice Popsicles Tea (sugar ok, no milk/cream) Powdered fruit flavored drinks Coffee (sugar ok, no milk/cream) Gatorade Juice: apple, white grape, white cranberry  Lemonade Clear bullion, consomm, broth Carbonated beverages (any kind) Strained chicken noodle soup Hard Candy                             4.  In the morning, mix first dose of MoviPrep solution:    Empty 1 Pouch A and 1 Pouch B into the disposable container    Add lukewarm drinking water to the top line of the container. Mix to dissolve    Refrigerate (mixed solution should be used within 24 hrs)  5.  Begin drinking the prep at 5:00 p.m. The MoviPrep container is divided by 4 marks.   Every 15 minutes drink the solution down to the next mark (approximately 8 oz) until the full liter is complete.   6.  Follow completed prep with 16 oz of clear liquid of your choice  (Nothing red or purple).  Continue to drink clear liquids until bedtime.  7.  Before going to bed, mix second dose of MoviPrep solution:    Empty 1 Pouch A and 1 Pouch B into the disposable container    Add lukewarm drinking water to the top line of the container. Mix to dissolve    Refrigerate  THE DAY OF YOUR PROCEDURE      DATE: 12/23/09 DAY: WEDNESDAY  Beginning at 4:30AM (5 hours before procedure):         1. Every 15 minutes, drink the solution down to the next mark (approx 8 oz) until the full liter is complete.  2. Follow completed prep with 16 oz. of clear liquid of your choice.    3. You may drink clear liquids until 7:30AM (2 HOURS BEFORE PROCEDURE).   MEDICATION INSTRUCTIONS  Unless otherwise instructed, you should take regular prescription medications with a small sip of water   as early as possible the morning of  your procedure.  Hold Furosemide before coming in the day of your procedure (only hold it on 12-23-09)       OTHER INSTRUCTIONS  You will need a responsible adult at least 73 years of age to accompany you and drive you home.   This person must remain in the waiting room during your procedure.  Wear loose fitting clothing that is easily removed.  Leave jewelry and other valuables at home.  However, you may wish to bring a book to read or  an iPod/MP3 player to listen to music as you wait for your procedure to start.  Remove all body piercing jewelry and leave at home.  Total time from sign-in until discharge is approximately 2-3 hours.  You should go home directly after your procedure and rest.  You can resume normal activities the  day after your procedure.  The day of your procedure you should not:   Drive   Make legal decisions   Operate machinery   Drink alcohol   Return to work  You will receive specific instructions about eating, activities and medications before you leave.    The above instructions have been reviewed and  explained to me by   Karl Bales RN  December 09, 2009 10:26 AM     I fully understand and can verbalize these instructions _____________________________ Date _________

## 2010-09-28 NOTE — Miscellaneous (Signed)
Summary: Orders Update  Clinical Lists Changes  Medications: Rx of LISINOPRIL 20 MG TABS (LISINOPRIL) two times a day;  #180 x 3;  Signed;  Entered by: Charolotte Capuchin, RN;  Authorized by: Rollene Rotunda, MD, [facility];  Method used: Electronically to [facility]  [phone]*, [address] [place], [place]  [address], Ph: [phone] or [phone], Fax: [phone] Rx of AMLODIPINE BESYLATE 10 MG TABS (AMLODIPINE BESYLATE) take one tablet once daily;  #90 x 3;  Signed;  Entered by: Charolotte Capuchin, RN;  Authorized by: Rollene Rotunda, MD, [facility];  Method used: Electronically to [facility]  [phone]*, [address] [place], [place]  [address], Ph: [phone] or [phone], Fax: [phone] Rx of FUROSEMIDE 40 MG  TABS (FUROSEMIDE) 1 by mouth once daily and 1/2 once daily as needed;  #120 x 3;  Signed;  Entered by: Charolotte Capuchin, RN;  Authorized by: Rollene Rotunda, MD, [facility];  Method used: Electronically to [facility]  [phone]*, [address] [place], [place]  [address], Ph: [phone] or [phone], Fax: [phone]    Prescriptions: FUROSEMIDE 40 MG  TABS (FUROSEMIDE) 1 by mouth once daily and 1/2 once daily as needed  #120 x 3   Entered by:   Charolotte Capuchin, RN   Authorized by:   Rollene Rotunda, MD, [facility]   Signed by:   Charolotte Capuchin, RN on 11/06/2009   Method used:   Electronically to        [facility]  [phone]* (retail)       [address] [place], [place]  [address]       Ph: [phone] or [phone]       Fax: [phone]   RxID:   [id number] AMLODIPINE BESYLATE 10 MG TABS (AMLODIPINE BESYLATE) take one tablet once daily  #90 x 3   Entered by:   Charolotte Capuchin, RN   Authorized by:   Rollene Rotunda, MD, [facility]   Signed by:   Charolotte Capuchin, RN on 11/06/2009   Method used:   Electronically to        [facility]  [phone]* (retail)       [address] [place], [place]  [address]       Ph:  [phone] or [phone]       Fax: [phone]   RxID:   [id number] LISINOPRIL 20 MG TABS (LISINOPRIL) two times a day  #180 x 3   Entered by:   Charolotte Capuchin, RN   Authorized by:   Rollene Rotunda, MD, [facility]   Signed by:   Charolotte Capuchin, RN on 11/06/2009   Method used:   Electronically to        [facility]  [phone]* (retail)       [address] [place], [place]  [address]       Ph: [phone] or [phone]       Fax: [phone]   RxID:   [id number]

## 2010-09-28 NOTE — Assessment & Plan Note (Signed)
Summary: pc2/pacer check  Medications Added CALTRATE 600+D 600-400 MG-UNIT TABS (CALCIUM CARBONATE-VITAMIN D) 2 by mouth AM...2 tab qpm FISH OIL 1000 MG CAPS (OMEGA-3 FATTY ACIDS) 1 cap once daily PRADAXA 150 MG CAPS (DABIGATRAN ETEXILATE MESYLATE) Take 1 capsule by mouth two times a day        Visit Type:  Pacemaker check Referring Provider:  Dr. Antoine Poche Primary Provider:  Dr. Alwyn Dominguez  CC:  no cardiac complaints today.  History of Present Illness: Amy Dominguez is seen in followup for pacemaker implantation for symptomatic sinus node dysfunction. She has a history of mild aortic stenosis.  Her dyspnea is much improved. She is able to climb a flight of stairs.  her thromboembolic risk factors are noted for hypertension age and gender for CHADS VASC score of 3    Current Medications (verified): 1)  Furosemide 40 Mg  Tabs (Furosemide) .Marland Kitchen.. 1 By Mouth Once Daily and 1/2 Once Daily As Needed 2)  Amlodipine Besylate 10 Mg Tabs (Amlodipine Besylate) .... Take One Tablet Once Daily 3)  Lisinopril 20 Mg Tabs (Lisinopril) .... Two Times A Day 4)  Klor-Con M10 10 Meq Cr-Tabs (Potassium Chloride Crys Cr) .Marland Kitchen.. 1 Once Daily As Needed, If Extra Half of Lasix Taken 5)  Bayer Aspirin 325 Mg Tabs (Aspirin) .Marland Kitchen.. 1 Once Daily 6)  Zyrtec Allergy 10 Mg Tabs (Cetirizine Hcl) .... Once Daily 7)  Estro Van .... Qd 8)  Anti-Oxidant  Tabs (Multiple Vitamin) .... Once Daily 9)  Caltrate 600+d 600-400 Mg-Unit Tabs (Calcium Carbonate-Vitamin D) .... 2 By Mouth Am...2 Tab Qpm 10)  Sam-E 400 Mg Tabs (S-Adenosylmethionine) .Marland Kitchen.. 1 By Mouth Once Daily 11)  Fish Oil 1000 Mg Caps (Omega-3 Fatty Acids) .Marland Kitchen.. 1 Cap Once Daily  Allergies: 1)  ! Tylenol  Past History:  Past Medical History: Last updated: 01/01/2010 ARTHRALGIA (ICD-719.40) EDEMA- LOCALIZED (ICD-782.3) UTERINE POLYP (ICD-621.0), PMH of  COMMON MIGRAINE (ICD-346.10), PMH of Colonic polyps, hx of Hypertension Mild AS Hyperlipidemia: LDL goal =  < 160 nased on NMR Hypothyroidism Skin cancer, hx of  RUE 06/2009, Dr Retta Diones  Medtr[employer] Adapta  Vital Signs:  Patient profile:   75 year old female Height:      63 inches Weight:      193 pounds BMI:     34.31 Pulse rate:   78 / minute Pulse rhythm:   regular BP sitting:   117 / 71  (left arm) Cuff size:   large  Vitals Entered By: Amy Dominguez, CMA (Jan 05, 2010 10:06 AM)  Physical Exam  General:  The patient was alert and oriented in no acute distress. HEENT Normal.  Neck veins were flat, carotids were brisk.  Lungs were clear.  Heart sounds were regular without murmurs or gallops.  Abdomen was soft with active bowel sounds. There is no clubbing cyanosis or edema. Skin Warm and dry    PPM Specifications Following MD:  Amy Manges, MD     Referring MD:  HOPPER PPM Vendor:  Medtronic     PPM Model Number:  [id number]     PPM Serial Number:  [id number] [facility] PPM DOI:  10/23/2009     PPM Implanting MD:  Amy Manges, MD  Lead 1    Location: RA     DOI: 10/23/2009     Model #: [id number]     Serial #: [id number]     Status: active Lead 2    Location: RV     DOI: 10/23/2009     Model #:  [id number]     Serial #: [id number]     Status: active  Magnet Response Rate:  BOL 85 ERI 65  Indications:  SSS, BRADYCARDIA   PPM Follow Up Battery Voltage:  2.79 V     Battery Est. Longevity:  10.5 YRS     Pacer Dependent:  No       PPM Device Measurements Atrium  Amplitude: 2.80 mV, Impedance: 511 ohms, Threshold: 0.50 V at 0.40 msec Right Ventricle  Amplitude: 31.36 mV, Impedance: 687 ohms, Threshold: 0.50 V at 0.40 msec  Episodes MS Episodes:  4     Percent Mode Switch:  6.7%     Coumadin:  No Ventricular High Rate:  2     Atrial Pacing:  88.9%     Ventricular Pacing:  0.7%  Parameters Mode:  MVP(R)     Lower Rate Limit:  60     Upper Rate Limit:  130 Paced AV Delay:  150     Sensed AV Delay:  120 Next Remote Date:  04/08/2010     Tech Comments:  NORMAL DEVICE FUNCTION.  4 MODE  SWITCHES-LONGEST WAS 44 HRS 29 MINUTES.  CHANGED A AMPLITUDE TO 2.00 AND RV AMPLITUDE TO 2.5 V.  CARELINK CHECK 04-08-10.  Amy Dominguez  Jan 05, 2010 10:51 AM  Impression & Recommendations:  Problem # 1:  ATRIAL FLUTTER (ICD-427.32) atrial flutter was identified by her device. He has a cycle length of her possibly 200 ms. She was there for a proximally 44 hours. Neuroma blood risk factors give rise to CHADS VASC score of 3. We have discussed potential benefits of Coumadin and/or Pradaxa. She is about ready to leave on a four-month excursion. She would like to take Pradaxa as an alternative to catheter ablation which we'll pursue when she returns. We have discussed the risks of the drug particularly G disstress and GI bleeding.  Will remove her aspirin Her updated medication list for this problem includes:    Bayer Aspirin 325 Mg Tabs (Aspirin) .Marland Kitchen... 1 once daily  Problem # 2:  CARDIAC PACEMAKER IN SITU-MEDTRONIC ADAPTA (ICD-V45.01) Device parameters and data were reviewed and no changes were made  Problem # 3:  SINUS NODE DYSFUNCTION   HEART RATES<40 (ICD-427.89) stable  Her updated medication list for this problem includes:    Amlodipine Besylate 10 Mg Tabs (Amlodipine besylate) .Marland Kitchen... Take one tablet once daily    Lisinopril 20 Mg Tabs (Lisinopril) .Marland Kitchen..Marland Kitchen Two times a day    Bayer Aspirin 325 Mg Tabs (Aspirin) .Marland Kitchen... 1 once daily  Patient Instructions: 1)  Your physician has recommended you make the following change in your medication: Start Pradaxa 150mg  1 tablet twice daily. 2)  Your physician wants you to follow-up in: October with Dr Graciela Husbands.  You will receive a reminder letter in the mail two months in advance. If you don't receive a letter, please call our office to schedule the follow-up appointment. Prescriptions: PRADAXA 150 MG CAPS (DABIGATRAN ETEXILATE MESYLATE) Take 1 capsule by mouth two times a day  #60 x 11   Entered by:   Optometrist BSN   Authorized by:   Nathen May, MD, [facility]   Signed by:   Gypsy Balsam RN BSN on 01/05/2010   Method used:   Electronically to        [facility]  [phone]* (retail)       [address] [place], [place]  [address]  Ph: [phone] or [phone]       Fax: [phone]   RxID:   [id number]

## 2010-09-28 NOTE — Letter (Signed)
Summary: *Consult Note  [facility]  [address]   [place], [place] [address]   Phone: [phone]  Fax: [phone]    Re:    Amy Dominguez DOB:    08-Apr-1936 Dr. Jacqualyn Posey, D.C. [address] [place], [place] 12/21/09    Dear Dr. Melvyn Neth:    Thank you for requesting that we see the above patient for consultation.  A copy of the detailed office note will be sent under separate cover, for your review.  Evaluation today is consistent with:  1)  ABNORMALITY OF GAIT (ICD-781.2) 2)  HIP PAIN, BILATERAL (ICD-719.45)   Our recommendation is for: changes to lessen pronation in her current orthotics which are in excellent condition. Some genralized strength work for hip abduction and rotation at home. Continue to see you as needed for manipulation and therapy.   New Orders include:  1)  New Patient Level II [99202]   After today's visit, the patients current medications include: 1)  FUROSEMIDE 40 MG  TABS (FUROSEMIDE) 1 by mouth once daily and 1/2 once daily as needed 2)  AMLODIPINE BESYLATE 10 MG TABS (AMLODIPINE BESYLATE) take one tablet once daily 3)  LISINOPRIL 20 MG TABS (LISINOPRIL) two times a day 4)  KLOR-CON M10 10 MEQ CR-TABS (POTASSIUM CHLORIDE CRYS CR) 1 once daily as needed, IF EXTRA HALF OF LASIX TAKEN 5)  BAYER ASPIRIN 325 MG TABS (ASPIRIN) 1 once daily 6)  ZYRTEC ALLERGY 10 MG TABS (CETIRIZINE HCL) once daily 7)  * ESTRO VAN qd 8)  ANTI-OXIDANT  TABS (MULTIPLE VITAMIN) once daily 9)  CALTRATE 600+D 600-400 MG-UNIT TABS (CALCIUM CARBONATE-VITAMIN D) 2 by mouth AM, 1 by mouth PM 10)  SAM-E 400 MG TABS (S-ADENOSYLMETHIONINE) 1 by mouth once daily 11)  MOVIPREP 100 GM  SOLR (PEG-KCL-NACL-NASULF-NA ASC-C) As per prep instructions.   Thank you for this consultation.  If you have any further questions regarding the care of this patient, please do not hesitate to contact me @ [phone]  Thank you for this opportunity to look after your  patient.  Sincerely,  Vincent Gros MD

## 2010-09-28 NOTE — Letter (Signed)
Summary: Implantable Device Instructions  Architectural technologist, Main Office  [address]   [place], [place] [address]   Phone: [phone]  Fax: [phone]      Implantable Device Instructions  You are scheduled for:  ___x__ Permanent Transvenous Pacemaker _____ Implantable Cardioverter Defibrillator _____ Implantable Loop Recorder _____ Generator Change  on FEB. 25, 2011 with Dr. Graciela Husbands.  1.  Please arrive at the Short Stay Center at [facility] at 0600 on the day of your procedure.  2.  Do not eat or drink the night before your procedure.  3.  Complete lab work on _____.  The lab at [facility] is open from 8:30 AM to 1:30 PM and from 2:30 PM to 5:00 PM.  The lab at [facility] is open from 7:30 AM to 5:30 PM.  You do not have to be fasting.  4.  Do NOT take these medications: FUROSEMIDE prior to your procedure:  Take your remaining medications with a sip of water that morning.  5.  Plan for an overnight stay.  Bring your insurance cards and a list of your medications.  6.  Wash your chest and neck with antibacterial soap (any brand) the evening before and the morning of your procedure.  Rinse well.  7.  Education material received:     Pacemaker _x____           ICD _____           Arrhythmia _____  *If you have ANY questions after you get home, please call the office [phone].  *Every attempt is made to prevent procedures from being rescheduled.  Due to the nauture of Electrophysiology, rescheduling can happen.  The physician is always aware and directs the staff when this occurs.

## 2010-09-28 NOTE — Progress Notes (Signed)
Summary: calling regarding labs   Phone Note Call from Patient Call back at [phone]   Caller: Patient Summary of Call: Pt calling regarding labs want be at above number for another two hours so call after that per pt request. Initial call taken by: Judie Grieve,  March 19, 2010 9:05 AM  Follow-up for Phone Call        Attemted to call x2 phone sounds bussy. Ollen Gross, RN, BSN  March 19, 2010 11:29 AM  The pt needs another oder for her labs sent to a different fax #. The # is [phone]. I will fax an order for a BMP to be drawn. Follow-up by: Sherri Rad, RN, BSN,  March 19, 2010 12:41 PM  Additional Follow-up for Phone Call Additional follow up Details #1::        I attempted to fax an order to the above # the pt. had left. That # is acutally the phone # for the service center. I called them, and the fax # is [phone]. I have faxed the order there. Additional Follow-up by: Sherri Rad, RN, BSN,  March 19, 2010 12:57 PM

## 2010-09-28 NOTE — Cardiovascular Report (Signed)
Summary: Office Visit   Office Visit   Imported By: Roderic Ovens 12/16/2009 16:27:02  _____________________________________________________________________  External Attachment:    Type:   Image     Comment:   External Document

## 2010-09-28 NOTE — Procedures (Signed)
Summary: wound check/jml    Allergies: 1)  ! Tylenol  PPM Specifications Following MD:  Sherryl Manges, MD     Referring MD:  HOPPER PPM Vendor:  Medtronic     PPM Model Number:  ADDRL1     PPM Serial Number:  [id number] PPM DOI:  10/23/2009     PPM Implanting MD:  Sherryl Manges, MD  Lead 1    Location: RA     DOI: 10/23/2009     Model #: [id number]     Serial #: [id number]     Status: active Lead 2    Location: RV     DOI: 10/23/2009     Model #: [id number]     Serial #: [id number]     Status: active  Magnet Response Rate:  BOL 85 ERI 65  Indications:  SSS, BRADYCARDIA   PPM Follow Up Remote Check?  No Battery Voltage:  2.79 V     Battery Est. Longevity:  8 YEARS     Pacer Dependent:  No       PPM Device Measurements Atrium  Amplitude: PACED AT 30 mV, Impedance: 489 ohms, Threshold: 0.5 V at 0.4 msec Right Ventricle  Amplitude: 22.4 mV, Impedance: 649 ohms, Threshold: 0.625 V at 0.4 msec  Episodes MS Episodes:  6     Percent Mode Switch:  <0.1%     Coumadin:  No Ventricular High Rate:  0     Atrial Pacing:  89.3%     Ventricular Pacing:  1.6%  Parameters Mode:  MVP(R)     Lower Rate Limit:  60     Upper Rate Limit:  130 Paced AV Delay:  150     Sensed AV Delay:  120 Next Cardiology Appt Due:  01/27/2010 Tech Comments:  Wound check appt. Steri-strips removed.  Wound without redness or edema and is well healed.  Normal device function.  Pt states exercise tolerance is better s/p PPM, but still not what she wants it to be.  Explained some of this is likely related to deconditioning.  Pt requested base rate be increased.  Left base rate at 60, ADL response changed from 3-4 to see if this would help exercise tolerance.  If patient still does not think she is where she needs to be energy level wise, we can try putting her on a treadmill to adjust rate response later.  [place]  Will order [employer] box for her to take with her.  She has an appt 5-10 with SK that she will keep.  Gypsy Balsam RN BSN  November 09, 2009 2:07 PM

## 2010-09-28 NOTE — Procedures (Signed)
Summary: Cardiology Device Clinic    Allergies: 1)  ! Tylenol  PPM Specifications Following MD:  Sherryl Manges, MD     Referring MD:  HOPPER PPM Vendor:  Medtronic     PPM Model Number:  ADDRL1     PPM Serial Number:  [id number] H PPM DOI:  10/23/2009     PPM Implanting MD:  Sherryl Manges, MD  Lead 1    Location: RA     DOI: 10/23/2009     Model #: 4098     Serial #: [id number]     Status: active Lead 2    Location: RV     DOI: 10/23/2009     Model #: 6213     Serial #: [id number]     Status: active  Magnet Response Rate:  BOL 85 ERI 65  Indications:  SSS, BRADYCARDIA   PPM Follow Up Pacer Dependent:  No      Episodes Coumadin:  No  Parameters Mode:  MVP(R)     Lower Rate Limit:  60     Upper Rate Limit:  130 Paced AV Delay:  150     Sensed AV Delay:  120 Tech Comments:  GXT done with patient to try to optimize rate response.  Pt c/o labored breathing and bilateral thigh pain after about 2 blocks of walking with device implant.  Pt walked with ADL and Exertion setpoints at 4/3, 3/4, and 2/2.  Best response was at 2/2.  Pt left programmed here.  She will call back with problems before her appt in May with Dr Graciela Husbands. Gypsy Balsam RN BSN  December 09, 2009 2:13 PM

## 2010-09-28 NOTE — Medication Information (Signed)
Summary: Physician Orders   Physician Orders   Imported By: Roderic Ovens 03/25/2010 16:02:59  _____________________________________________________________________  External Attachment:    Type:   Image     Comment:   External Document

## 2010-09-28 NOTE — Letter (Signed)
Summary: [facility]  [facility]   Imported By: Lanelle Bal 07/26/2010 14:13:30  _____________________________________________________________________  External Attachment:    Type:   Image     Comment:   External Document

## 2010-09-28 NOTE — Assessment & Plan Note (Signed)
Summary: 6 month return.amber  Medications Added LISINOPRIL 20 MG TABS (LISINOPRIL) once daily TORSEMIDE 20 MG TABS (TORSEMIDE) once daily HYDROGESIC 5-500 MG CAPS (HYDROCODONE-ACETAMINOPHEN) 1 tab, two times a day      Allergies Added:   Visit Type:  PPM-Medtronic Referring Provider:  Dr. Antoine Poche Primary Provider:  Dr. Alwyn Ren  CC:  no complaints.  History of Present Illness: Amy Dominguez is seen in followup for pacemaker implantation for symptomatic sinus node dysfunction. She has a history of mild aortic stenosis.  Her dyspnea was  much improved. She is able to climb a flight of stairs still but there is more shortness of breath and exercise intolerance.  She also had ptosis of her with feeling very crummy. She is modifying her medications and there have been further medications and she is feeling a good deal better but still with some exercise intolerance  her thromboembolic risk factors are noted for hypertension age and gender for CHADS VASC score of 3    Problems Prior to Update: 1)  Low Back Pain, Chronic  (ICD-724.2) 2)  Fatigue  (ICD-780.79) 3)  Degenerative Joint Disease, Knees, Bilateral  (ICD-715.96) 4)  Renal Insufficiency  (ICD-588.9) 5)  Atrial Flutter  (ICD-427.32) 6)  Cardiac Pacemaker in Situ-medtronic Adapta  (ICD-V45.01) 7)  Abnormality of Gait  (ICD-781.2) 8)  Hip Pain, Bilateral  (ICD-719.45) 9)  Mitral Regurgitation, Mild  (ICD-396.3) 10)  Sinus Node Dysfunction Heart Rates<40  (ICD-427.89) 11)  Hyperlipidemia  (ICD-272.4) 12)  Tiredness  (ICD-780.79) 13)  Skin Cancer, Hx of  (ICD-V10.83) 14)  Hypothyroidism  (ICD-244.9) 15)  Copd, Mild  (ICD-496) 16)  Aortic Stenosis  (ICD-424.1) 17)  Hypertension  (ICD-401.9) 18)  Colonic Polyps, Hx of  (ICD-V12.72) 19)  Dyspnea On Exertion  (ICD-786.09) 20)  Edema- Localized  (ICD-782.3) 21)  Symptom, Abnormal Weight Gain  (ICD-783.1) 22)  Arthralgia  (ICD-719.40) 23)  Uterine Polyp  (ICD-621.0) 24)   Tonsillectomy and Adenoidectomy, Hx of  (ICD-V45.79) 25)  Hypokalemia  (ICD-276.8) 26)  Arthritis  (ICD-716.90) 27)  Common Migraine  (ICD-346.10)  Current Medications (verified): 1)  Lisinopril 20 Mg Tabs (Lisinopril) .... Once Daily 2)  Zyrtec Allergy 10 Mg Tabs (Cetirizine Hcl) .... Once Daily 3)  Pradaxa 150 Mg Caps (Dabigatran Etexilate Mesylate) .... Take 1 Capsule By Mouth Two Times A Day 4)  Torsemide 20 Mg Tabs (Torsemide) .... Once Daily 5)  Klor-Con M20 20 Meq Cr-Tabs (Potassium Chloride Crys Cr) .... One Daily 6)  Hydrogesic 5-500 Mg Caps (Hydrocodone-Acetaminophen) .Marland Kitchen.. 1 Tab, Two Times A Day  Allergies (verified): 1)  ! Tylenol  Past History:  Past Medical History: Last updated: 01/01/2010 ARTHRALGIA (ICD-719.40) EDEMA- LOCALIZED (ICD-782.3) UTERINE POLYP (ICD-621.0), PMH of  COMMON MIGRAINE (ICD-346.10), PMH of Colonic polyps, hx of Hypertension Mild AS Hyperlipidemia: LDL goal = < 160 nased on NMR Hypothyroidism Skin cancer, hx of  RUE 06/2009, Dr Retta Diones  Medtronic Adapta  Past Surgical History: Last updated: 01/01/2010 Tubal ligation; uterine polypectomy 1998; toe & foot surgeries; G5 P4; Colon polypectomy 2005 & 2008, Dr Jarold Motto, due 2011 Hemorrhoidectomy Tonsillectomy Medtronic Adapta  Family History: Last updated: 09/16/2009 Father: AV  disease , MI in 44s Mother: CVA, AS, MV surgery, RHD Siblings: sister breast CA 2 daughters protein S deficiency (pt has normal Protein S) DVT- 1  Daughter  Social History: Last updated: 09/16/2009 sodium restriction , no added salt  Married Alcohol use-yes: socially Retired Charity fundraiser Former smoker:quit in 2008.  "Smoked most of adult life". Pt states  that she was only a social smoker,up to 3/4-1 ppd for 30 years .   Risk Factors: Exercise: yes (12/10/2007)  Risk Factors: Smoking Status: quit (12/10/2007)  Vital Signs:  Patient profile:   75 year old female Height:      63 inches Weight:      189.75  pounds BMI:     33.73 Pulse rate:   71 / minute BP sitting:   148 / 85  (right arm) Cuff size:   regular  Vitals Entered By: Amy Dominguez CMA (June 22, 2010 12:31 PM)  Physical Exam  General:  The patient was alert and oriented in no acute distress. HEENT Normal.  Neck veins were flat, carotids were brisk.  Lungs were clear.  Heart sounds were regular without murmurs or gallops.  Abdomen was soft with active bowel sounds. There is no clubbing cyanosis or edema. Skin Warm and dry    PPM Specifications Following MD:  Sherryl Manges, MD     Referring MD:  HOPPER PPM Vendor:  Medtronic     PPM Model Number:  ADDRL1     PPM Serial Number:  [id number] PPM DOI:  10/23/2009     PPM Implanting MD:  Sherryl Manges, MD  Lead 1    Location: RA     DOI: 10/23/2009     Model #: 4098     Serial #: [id number]     Status: active Lead 2    Location: RV     DOI: 10/23/2009     Model #: 6213     Serial #: [id number]     Status: active  Magnet Response Rate:  BOL 85 ERI 65  Indications:  SSS, BRADYCARDIA   PPM Follow Up Battery Voltage:  2.80 V     Battery Est. Longevity:  13.5 yrs     Pacer Dependent:  No       PPM Device Measurements Atrium  Amplitude: PACED mV, Impedance: 519 ohms, Threshold: 0.50 V at 0.40 msec Right Ventricle  Amplitude: 31.36 mV, Impedance: 631 ohms, Threshold: 0.50 V at 0.40 msec  Episodes MS Episodes:  22     Percent Mode Switch:  <0.1%     Coumadin:  No Ventricular High Rate:  13     Atrial Pacing:  91.4%     Ventricular Pacing:  0.6%  Parameters Mode:  MVP(R)     Lower Rate Limit:  60     Upper Rate Limit:  130 Paced AV Delay:  150     Sensed AV Delay:  120 Next Remote Date:  09/23/2010     Next Cardiology Appt Due:  05/30/2011 Tech Comments:  22 MODE SWITCHES. + PRADAXA.  13 VHR EPISODES--LONGEST WAS 12 SECONDS.  NORMAL DEVICE FUNCTION.  CHANGED RA OUTPUT FROM 1.5 TO 2.0 AND RV OUTPUT FROM 2.0 TO 2.5 V.  CARELINK 09-23-10 AND ROV IN 12 MTHS W/SK. Amy Dominguez   June 22, 2010 12:40 PM  Impression & Recommendations:  Problem # 1:  ATRIAL FLUTTER (ICD-427.32) no significant intercurrent atrial arrhythmias; duration of all episodes less than a minute. We will continue Pradaxa  Problem # 2:  CARDIAC PACEMAKER IN SITU-MEDTRONIC ADAPTA (ICD-V45.01) Device parameters and data were reviewed; we reprogrammed the rate response increase in her lower rate from 60-70, increased her ADL rate from 95-100, and increasing her ADL level from 2-3. She'll let us know  Problem # 3:  SINUS NODE DYSFUNCTION   HEART RATES<40 (ICD-427.89) as above The following medications were  removed from the medication list:    Amlodipine Besylate 10 Mg Tabs (Amlodipine besylate) .Marland Kitchen... Take one tablet once daily Her updated medication list for this problem includes:    Lisinopril 20 Mg Tabs (Lisinopril) ..... Once daily  Patient Instructions: 1)  Your physician recommends that you schedule a follow-up appointment in: Feb 2012 2)  Your physician recommends that you continue on your current medications as directed. Please refer to the Current Medication list given to you today.

## 2010-09-28 NOTE — Cardiovascular Report (Signed)
Summary: Office Visit Remote   Office Visit Remote   Imported By: Roderic Ovens 04/20/2010 15:38:24  _____________________________________________________________________  External Attachment:    Type:   Image     Comment:   External Document

## 2010-09-28 NOTE — Miscellaneous (Signed)
Summary: Device preload  Clinical Lists Changes  Observations: Added new observation of MAGNET RTE: BOL 85 ERI 65 (10/23/2009 16:26) Added new observation of PPMLEADSTAT2: active (10/23/2009 16:26) Added new observation of PPMLEADSER2: [id number] (10/23/2009 16:26) Added new observation of PPMLEADMOD2: 5076  (10/23/2009 16:26) Added new observation of PPMLEADLOC2: RV  (10/23/2009 16:26) Added new observation of PPMLEADSTAT1: active  (10/23/2009 16:26) Added new observation of PPMLEADSER1: [id number]  (10/23/2009 16:26) Added new observation of PPMLEADMOD1: 5076  (10/23/2009 16:26) Added new observation of PPMLEADLOC1: RA  (10/23/2009 16:26) Added new observation of PPM IMP MD: Sherryl Manges, MD  (10/23/2009 16:26) Added new observation of PPMLEADDOI2: 10/23/2009  (10/23/2009 16:26) Added new observation of PPMLEADDOI1: 10/23/2009  (10/23/2009 16:26) Added new observation of PPM DOI: 10/23/2009  (10/23/2009 16:26) Added new observation of PPM SERL#: [id number]  (10/23/2009 16:26) Added new observation of PPM MODL#: [id number]  (10/23/2009 42:70) Added new observation of PACEMAKERMFG: Medtronic  (10/23/2009 16:26) Added new observation of PACEMAKER MD: Sherryl Manges, MD  (10/23/2009 16:26)      PPM Specifications Following MD:  Sherryl Manges, MD     PPM Vendor:  Medtronic     PPM Model Number:  ADDRL1     PPM Serial Number:  [id number] PPM DOI:  10/23/2009     PPM Implanting MD:  Sherryl Manges, MD  Lead 1    Location: RA     DOI: 10/23/2009     Model #: [id number]     Serial #: [id number]     Status: active Lead 2    Location: RV     DOI: 10/23/2009     Model #: 6948     Serial #: [id number]     Status: active  Magnet Response Rate:  BOL 85 ERI 65

## 2010-09-28 NOTE — Assessment & Plan Note (Signed)
Summary: LEG PAIN---MOSTLY IN RT LEG///SPH   Vital Signs:  Patient profile:   75 year old female Weight:      192.6 pounds Temp:     98.0 degrees F oral Pulse rate:   72 / minute Resp:     15 per minute BP sitting:   118 / 70  (left arm) Cuff size:   large  Vitals Entered By: Shonna Chock CMA (May 24, 2010 2:52 PM) CC: 1.) Leg pain since Aug-no known injury   2.) Low energy, Lower Extremity Joint pain, Fatigue   Primary Care Provider:  Dr. Alwyn Ren  CC:  1.) Leg pain since Aug-no known injury   2.) Low energy, Lower Extremity Joint pain, and Fatigue.  History of Present Illness: Lower Extremity  Pain      This is a 75 year old woman who presents with Lower Extremity  pain since 03/2010 w/o trigger.  The patient denies swelling, redness, giving away, locking, popping, stiffness for >1 hr, decreased ROM, and weakness.  The pain is located in the right knee area ( below & above).  The pain began suddenly and with no injury.  The pain is described as aching, constant, and occuring at rest, worse with ambulation. Rx: Aleve as needed, up to 4/day. The patient denies the following symptoms: fever, rash, photosensitivity, eye symptoms, diarrhea, and dysuria.  She had Chiropractry & Acupuncture  for back pain in August in [place].She wants to see Dr Ethelene Hal. Fatigue      The patient also presents with Fatigue for several months.  The patient reports persistent fatigue, fatigue even without physical exertion,  primarily physical fatigue.  The patient also reports dyspnea which is chronic & unchanged.  The patient denies night sweats, weight loss, exertional chest pain, cough, and hemoptysis.  Leg swelling has responded to Torosemide 20 mg 2X/day.  The patient denies the following symptoms: orthopnea, PND, melena, adenopathy, severe snoring, and skin changes.  The patient denies feeling depressed and sleep apnea.  TSH & vitamin D done 04/13/2010. GFR 39 & creatinine 1.35 on 07/25 in [place].  Current  Medications (verified): 1)  Amlodipine Besylate 10 Mg Tabs (Amlodipine Besylate) .... Take One Tablet Once Daily 2)  Lisinopril 20 Mg Tabs (Lisinopril) .... Two Times A Day 3)  Zyrtec Allergy 10 Mg Tabs (Cetirizine Hcl) .... Once Daily 4)  Estro Van .... Qd 5)  Anti-Oxidant  Tabs (Multiple Vitamin) .... Once Daily 6)  Caltrate 600+d 600-400 Mg-Unit Tabs (Calcium Carbonate-Vitamin D) .... 2 By Mouth Am...2 Tab Qpm 7)  Sam-E 400 Mg Tabs (S-Adenosylmethionine) .Marland Kitchen.. 1 By Mouth Once Daily 8)  Fish Oil 1000 Mg Caps (Omega-3 Fatty Acids) .Marland Kitchen.. 1 Cap Once Daily 9)  Pradaxa 150 Mg Caps (Dabigatran Etexilate Mesylate) .... Take 1 Capsule By Mouth Two Times A Day 10)  Torsemide 20 Mg Tabs (Torsemide) .... One Bid 11)  Klor-Con M20 20 Meq Cr-Tabs (Potassium Chloride Crys Cr) .... One Daily  Allergies: 1)  ! Tylenol  Physical Exam  General:  in no acute distress; alert,appropriate and cooperative throughout examination Neck:  No deformities, masses, or tenderness noted. Lungs:  Normal respiratory effort, chest expands symmetrically. Lungs are clear to auscultation, no crackles or wheezes. Heart:  bradycardia and irregular rhythm.   Abdomen:  Bowel sounds positive,abdomen soft and non-tender without masses, organomegaly or hernias noted. Msk:  Neg SLR  Extremities:  Crepitus R > L ; ? effusion Neurologic:  strength normal in all extremities and DTRs symmetrical  and normal.   Skin:  Intact without suspicious lesions or rashes Cervical Nodes:  No lymphadenopathy noted Axillary Nodes:  No palpable lymphadenopathy Psych:  memory intact for recent and remote.  Frustrated by status    Impression & Recommendations:  Problem # 1:  DEGENERATIVE JOINT DISEASE, KNEES, BILATERAL (ICD-715.96)  R > L , ? effusion on R The following medications were removed from the medication list:    Bayer Aspirin 325 Mg Tabs (Aspirin) .Marland Kitchen... 1 once daily Her updated medication list for this problem includes:    Tramadol  Hcl 50 Mg Tabs (Tramadol hcl) .Marland Kitchen... 1 every 6 hrs as needed for knee pain  Orders: Prescription Created Electronically [phone])  Problem # 2:  LOW BACK PAIN, CHRONIC (ICD-724.2)  The following medications were removed from the medication list:    Bayer Aspirin 325 Mg Tabs (Aspirin) .Marland Kitchen... 1 once daily Her updated medication list for this problem includes:    Tramadol Hcl 50 Mg Tabs (Tramadol hcl) .Marland Kitchen... 1 every 6 hrs as needed for knee pain  Orders: Misc. Referral (Misc. Ref) Prescription Created Electronically [phone])  Problem # 3:  FATIGUE (ICD-780.79)  Orders: Venipuncture (03474) TLB-CBC Platelet - w/Differential (85025-CBCD) TLB-Hepatic/Liver Function Pnl (80076-HEPATIC)  Problem # 4:  RENAL INSUFFICIENCY (ICD-588.9)  Orders: Venipuncture (25956) TLB-BMP (Basic Metabolic Panel-BMET) (80048-METABOL)  Complete Medication List: 1)  Amlodipine Besylate 10 Mg Tabs (Amlodipine besylate) .... Take one tablet once daily 2)  Lisinopril 20 Mg Tabs (Lisinopril) .... Two times a day 3)  Zyrtec Allergy 10 Mg Tabs (Cetirizine hcl) .... Once daily 4)  Estro Van  .... Qd 5)  Anti-oxidant Tabs (Multiple vitamin) .... Once daily 6)  Caltrate 600+d 600-400 Mg-unit Tabs (Calcium carbonate-vitamin d) .... 2 by mouth am...2 tab qpm 7)  Sam-e 400 Mg Tabs (S-adenosylmethionine) .Marland Kitchen.. 1 by mouth once daily 8)  Fish Oil 1000 Mg Caps (Omega-3 fatty acids) .Marland Kitchen.. 1 cap once daily 9)  Pradaxa 150 Mg Caps (Dabigatran etexilate mesylate) .... Take 1 capsule by mouth two times a day 10)  Torsemide 20 Mg Tabs (Torsemide) .... One bid 11)  Klor-con M20 20 Meq Cr-tabs (Potassium chloride crys cr) .... One daily 12)  Tramadol Hcl 50 Mg Tabs (Tramadol hcl) .Marland Kitchen.. 1 every 6 hrs as needed for knee pain  Patient Instructions: 1)  Avoid Aleve. Drink to thirst , up to 40 oz of water /day. Prescriptions: TRAMADOL HCL 50 MG TABS (TRAMADOL HCL) 1 every 6 hrs as needed for knee pain  #30 x 0   Entered and Authorized  by:   [name] MD   Signed by:   [name] MD on 05/24/2010   Method used:   Faxed to ...       [facility]  [phone]* (retail)       [address] [place], [place]  [address]       Ph: [phone] or [phone]       Fax: [phone]   RxID:   [id number]   Appended Document: LEG PAIN---MOSTLY IN RT LEG///SPH

## 2010-09-28 NOTE — Progress Notes (Signed)
Summary: pt wants to set up treadmill test   Phone Note Call from Patient Call back at Home Phone [phone]   Caller: Patient Reason for Call: Talk to Nurse, Talk to Doctor Summary of Call: pt saw you a week and half ago and you talked about setting up a treadmill test and she would like to do that  Initial call taken by: Omer Jack,  November 27, 2009 11:35 AM  Follow-up for Phone Call        GXT scheduled for Tuesday, April 12th at 3:30 to try to optimize rate response.  Patient and industry aware. Gypsy Balsam RN BSN  November 27, 2009 5:06 PM

## 2010-09-28 NOTE — Miscellaneous (Signed)
Summary: Orders Update  Clinical Lists Changes  Orders: Added new Test order of Renal Artery Duplex (Renal Artery Duplex) - Signed 

## 2010-09-28 NOTE — Procedures (Signed)
Summary: Colonoscopy  Patient: Amy Dominguez Note: All result statuses are Final unless otherwise noted.  Tests: (1) Colonoscopy (COL)   COL Colonoscopy           DONE     [facility]     [address]     [place], [place]  [address]           COLONOSCOPY PROCEDURE REPORT           PATIENT:  [name], [name]  MR#:  [id number]     BIRTHDATE:  02-Jan-1936, 73 yrs. old  GENDER:  female     ENDOSCOPIST:  Vania Rea. Jarold Motto, MD, [facility]     REF. BY:     PROCEDURE DATE:  12/23/2009     PROCEDURE:  Colonoscopy with snare polypectomy     ASA CLASS:  Class II     INDICATIONS:  Elevated Risk Screening     MEDICATIONS:   Fentanyl 50 mcg IV, Versed 5 mg IV           DESCRIPTION OF PROCEDURE:   After the risks benefits and     alternatives of the procedure were thoroughly explained, informed     consent was obtained.  Digital rectal exam was performed and     revealed perianal skin tags.   The LB [id number] endoscope     was introduced through the anus and advanced to the cecum, which     was identified by both the appendix and ileocecal valve, without     limitations.  The quality of the prep was excellent, using     MoviPrep.  The instrument was then slowly withdrawn as the colon     was fully examined.     <<PROCEDUREIMAGES>>           FINDINGS:  There were multiple polyps identified and removed. in     the sigmoid colon. SMALL1-3 MM NODULES HOT SNARE EXCISED.1     RETRIEVED FOR EXAM.  This was otherwise a normal examination of     the colon.  Melanosis coli was found.   Retroflexed views in the     rectum revealed no abnormalities.    The scope was then withdrawn     from the patient and the procedure completed.           COMPLICATIONS:  None     ENDOSCOPIC IMPRESSION:     1) Polyps, multiple in the sigmoid colon     2) Otherwise normal examination     3) Melanosis     RECOMMENDATIONS:     1) Repeat Colonoscopy in 3 years.     REPEAT EXAM:  No        ______________________________     Vania Rea. Jarold Motto, MD, [facility]           CC:  Pecola Lawless, MD           n.     Rosalie DoctorMarland Kitchen   Vania Rea. Normand Damron at 12/23/2009 10:01 AM           Herminio Heads, [id number]  Note: An exclamation mark (!) indicates a result that was not dispersed into the flowsheet. Document Creation Date: 12/23/2009 10:02 AM _______________________________________________________________________  (1) Order result status: Final Collection or observation date-time: 12/23/2009 09:54 Requested date-time:  Receipt date-time:  Reported date-time:  Referring Physician:   Ordering Physician: [name] [phone]) Specimen Source:  Source: Launa Grill Order Number: [id number] Lab site:   Appended Document:  Colonoscopy 10y f/u as needed per age  Appended Document: Colonoscopy     Procedures Next Due Date:    Colonoscopy: 11/2019

## 2010-09-28 NOTE — Cardiovascular Report (Signed)
Summary: Pre Op Orders  Pre Op Orders   Imported By: Roderic Ovens 10/27/2009 16:01:16  _____________________________________________________________________  External Attachment:    Type:   Image     Comment:   External Document

## 2010-09-28 NOTE — Assessment & Plan Note (Signed)
Summary: bradyarrythmias/per Dr hopper  Medications Added AMLODIPINE BESYLATE 10 MG TABS (AMLODIPINE BESYLATE) take one tablet once daily LISINOPRIL 20 MG TABS (LISINOPRIL) two times a day      Allergies Added:   Referring Provider:  Dr. Antoine Poche Primary Provider:  Dr. Alwyn Ren  CC:  pt seen for bradycardia.  History of Present Illness: Amy Dominguez is seen at the request of Dr. Antoine Poche for consideration of pacemaker implantation.  She is a 75 year old woman with a past medical history notable for hypertension which is mild as is her aortic stenosis, per her echo from December 2009.  She has a history of bradycardia identified at Holter monitoring previously with nocturnal rates in the 30s and has had heart rates in the 50s for the last couple of years. Over the last couple of months however she has noted progressive exercise limitations. She is short of breath climbing a flight of stairs. She is tired. She went to go see Dr. Alwyn Ren last week was found to have a heart rate of 39 which was junctional.   hemoglobin and TSH are both within normal limits.  She denies exertional chest discomfort.  she has some peripheral edema. This does not seem to correlate with her amlodipine. She does not have nocturnal dyspnea or orthopnea.  She has a history of remote syncope.  Problems Prior to Update: 1)  Mitral Regurgitation, Mild  (ICD-396.3) 2)  Bradyarrhythmia  (ICD-427.89) 3)  Hyperlipidemia  (ICD-272.4) 4)  Tiredness  (ICD-780.79) 5)  Routine General Medical Exam@health  Care Facl  (ICD-V70.0) 6)  Skin Cancer, Hx of  (ICD-V10.83) 7)  Hypothyroidism  (ICD-244.9) 8)  Copd, Mild  (ICD-496) 9)  Aortic Stenosis  (ICD-424.1) 10)  Hypertension  (ICD-401.9) 11)  Colonic Polyps, Hx of  (ICD-V12.72) 12)  Dyspnea On Exertion  (ICD-786.09) 13)  Edema- Localized  (ICD-782.3) 14)  Symptom, Abnormal Weight Gain  (ICD-783.1) 15)  Arthralgia  (ICD-719.40) 16)  Uterine Polyp  (ICD-621.0) 17)   Tonsillectomy and Adenoidectomy, Hx of  (ICD-V45.79) 18)  Hypokalemia  (ICD-276.8) 19)  Arthritis  (ICD-716.90) 20)  Common Migraine  (ICD-346.10)  Medications Prior to Update: 1)  Furosemide 40 Mg  Tabs (Furosemide) .Marland Kitchen.. 1 By Mouth Once Daily and 1/2 Once Daily As Needed 2)  Amlodipine Besylate 10 Mg Tabs (Amlodipine Besylate) .... Patient Takes 20mg  Daily 3)  Lisinopril 20 Mg Tabs (Lisinopril) .Marland Kitchen.. 1 By Mouth Once Daily 4)  Klor-Con M10 10 Meq Cr-Tabs (Potassium Chloride Crys Cr) .Marland Kitchen.. 1 Once Daily As Needed, If Extra Half of Lasix Taken 5)  Bayer Aspirin 325 Mg Tabs (Aspirin) .Marland Kitchen.. 1 Once Daily 6)  Zyrtec Allergy 10 Mg Tabs (Cetirizine Hcl) .... Once Daily 7)  Estro Van .... Qd 8)  Anti-Oxidant  Tabs (Multiple Vitamin) .... Once Daily 9)  Caltrate 600+d 600-400 Mg-Unit Tabs (Calcium Carbonate-Vitamin D) .... 2 By Mouth Am, 1 By Mouth Pm 10)  Sam-E 400 Mg Tabs (S-Adenosylmethionine) .Marland Kitchen.. 1 By Mouth Once Daily  Current Medications (verified): 1)  Furosemide 40 Mg  Tabs (Furosemide) .Marland Kitchen.. 1 By Mouth Once Daily and 1/2 Once Daily As Needed 2)  Amlodipine Besylate 10 Mg Tabs (Amlodipine Besylate) .... Take One Tablet Once Daily 3)  Lisinopril 20 Mg Tabs (Lisinopril) .... Two Times A Day 4)  Klor-Con M10 10 Meq Cr-Tabs (Potassium Chloride Crys Cr) .Marland Kitchen.. 1 Once Daily As Needed, If Extra Half of Lasix Taken 5)  Bayer Aspirin 325 Mg Tabs (Aspirin) .Marland Kitchen.. 1 Once Daily 6)  Zyrtec Allergy 10 Mg  Tabs (Cetirizine Hcl) .... Once Daily 7)  Estro Van .... Qd 8)  Anti-Oxidant  Tabs (Multiple Vitamin) .... Once Daily 9)  Caltrate 600+d 600-400 Mg-Unit Tabs (Calcium Carbonate-Vitamin D) .... 2 By Mouth Am, 1 By Mouth Pm 10)  Sam-E 400 Mg Tabs (S-Adenosylmethionine) .Marland Kitchen.. 1 By Mouth Once Daily  Allergies (verified): 1)  ! Tylenol  Past History:  Past Medical History: Last updated: 09/16/2009 ARTHRALGIA (ICD-719.40) EDEMA- LOCALIZED (ICD-782.3) UTERINE POLYP (ICD-621.0), PMH of  COMMON MIGRAINE  (ICD-346.10), PMH of Colonic polyps, hx of Hypertension Mild AS Hyperlipidemia: LDL goal = < 160 nased on NMR Hypothyroidism Skin cancer, hx of  RUE 06/2009, Dr Retta Diones   Past Surgical History: Last updated: 09/16/2009 Tubal ligation; uterine polypectomy 1998; toe & foot surgeries; G5 P4; Colon polypectomy 2005 & 2008, Dr Jarold Motto, due 2011 Hemorrhoidectomy Tonsillectomy  Family History: Last updated: 09/16/2009 Father: AV  disease , MI in 59s Mother: CVA, AS, MV surgery, RHD Siblings: sister breast CA 2 daughters protein S deficiency (pt has normal Protein S) DVT- 1  Daughter  Social History: Last updated: 09/16/2009 sodium restriction , no added salt  Married Alcohol use-yes: socially Retired Charity fundraiser Former smoker:quit in 2008.  "Smoked most of adult life". Pt states that she was only a social smoker,up to 3/4-1 ppd for 30 years .   Review of Systems       full review of systems was negative apart from a history of present illness and past medical history    Vital Signs:  Patient profile:   75 year old female Height:      62.25 inches Weight:      190 pounds BMI:     34.60 Pulse rate:   52 / minute Pulse rhythm:   regular BP sitting:   132 / 60  (left arm) Cuff size:   large  Vitals Entered By: Amy Dominguez CMA (September 22, 2009 8:46 AM)  Physical Exam  General:  Alert and oriented older Caucasian female appearing her stated age in no acute distress. HEENT  normal . Neck veins were flat; carotids brisk and full without bruits. No lymphadenopathy. Back without kyphosis. Lungs clear. Heart sounds regular but slow  PMI nondisplaced. Abdomen soft with active bowel sounds without midline pulsation or hepatomegaly. Femoral pulses and distal pulses intact. Extremities were without clubbing cyanosis or edemaSkin warm and dry. Neurological exam grossly normal affect is engaging   Impression & Recommendations:  Problem # 1:  SINUS NODE DYSFUNCTION   HEART RATES<40  (ICD-427.89) The patient has symptomatic sinus node dysfunction. She notes fatigue and impaired exercise performance. He is taking no drugs which are likely causing this. Metabolic parameters are also normal. We have discussed the potential benefits as well as risks of pacemaker implantation including but not limited to death perforation of heart or lung, lead dislodgment and infection. We also discussed interactions including MRI. They understand agree and are willing to proceed. Her updated medication list for this problem includes:    Amlodipine Besylate 10 Mg Tabs (Amlodipine besylate) .Marland Kitchen... Take one tablet once daily    Lisinopril 20 Mg Tabs (Lisinopril) .Marland Kitchen..Marland Kitchen Two times a day    Bayer Aspirin 325 Mg Tabs (Aspirin) .Marland Kitchen... 1 once daily  Problem # 2:  DYSPNEA ON EXERTION (ICD-786.09) I suspect that this is related to the above Her updated medication list for this problem includes:    Furosemide 40 Mg Tabs (Furosemide) .Marland Kitchen... 1 by mouth once daily and 1/2 once daily as needed  Amlodipine Besylate 10 Mg Tabs (Amlodipine besylate) .Marland Kitchen... Take one tablet once daily    Lisinopril 20 Mg Tabs (Lisinopril) .Marland Kitchen..Marland Kitchen Two times a day    Bayer Aspirin 325 Mg Tabs (Aspirin) .Marland Kitchen... 1 once daily  Patient Instructions: 1)  Your physician has recommended that you have a pacemaker inserted.  A pacemaker is a small device that is placed under the skin of your chest or abdomen to help control abnormal heart rhythms. This device uses electrical pulses to prompt the heart to beat at a normal rate. Pacemakers are used to treat heart rhythms that are too slow. Wires (leads) are attached to the pacemaker that goes into the chambers of your heart. This is done in the hospital and usually requires an overnight stay. Please see the instruction sheet given to you today for more information.

## 2010-09-28 NOTE — Consult Note (Signed)
Summary: [facility]  [facility]   Imported By: Lanelle Bal 06/09/2010 13:11:36  _____________________________________________________________________  External Attachment:    Type:   Image     Comment:   External Document

## 2010-09-28 NOTE — Progress Notes (Signed)
Summary:  Discuss Instructions   Phone Note Outgoing Call   Call placed by: Duncan Dull, RN, BSN,  October 07, 2009 11:25 AM Call placed to: Patient Summary of Call: Called patient and left message on machine  To discuss implant instructions, date and time, etc Duncan Dull, RN, BSN  October 07, 2009 11:25 AM   Follow-up for Phone Call        Pt aware and will come for labs tomorrow  Follow-up by: Duncan Dull, RN, BSN,  October 15, 2009 11:44 AM

## 2010-09-28 NOTE — Progress Notes (Signed)
Summary: pt wants to adjust meds   Phone Note Call from Patient Call back at [phone]   Caller: Patient Reason for Call: Talk to Nurse, Talk to Doctor Summary of Call: pt wants to talk about adjusting meds. she dosen't feel her lasix is working for her  Initial call taken by: Omer Jack,  January 27, 2010 12:55 PM  Follow-up for Phone Call        Spoke with pt. Pt. states is taken Lasix 40 mg  daily and Dr. Antoine Poche  said she could take an extra 40 mg the later part of the day. Pt. states she has done that several times and it is  not working Pt. states her ankles swell faster than before when she stands up. Pt. would like for Dr. Antoine Poche to call her asap. Ollen Gross, RN, BSN  January 27, 2010 1:11 PM   Additional Follow-up for Phone Call Additional follow up Details #1::        Discussed with patient.  Order given. Additional Follow-up by: Rollene Rotunda, MD, [facility],  January 27, 2010 9:54 PM

## 2010-09-28 NOTE — Cardiovascular Report (Signed)
Summary: Office Visit   Office Visit   Imported By: Roderic Ovens 01/11/2010 16:33:40  _____________________________________________________________________  External Attachment:    Type:   Image     Comment:   External Document

## 2010-09-28 NOTE — Letter (Signed)
Summary: Colonoscopy Letter  [facility]  [address] [place], [place] [address]   Phone: [phone]  Fax: [phone]      October 06, 2009 MRN: [id number]   Amy Dominguez [address] [place], [place]  [address]   Dear Amy Dominguez,   According to your medical record, it is time for you to schedule a Colonoscopy. The American Cancer Society recommends this procedure as a method to detect early colon cancer. Patients with a family history of colon cancer, or a personal history of colon polyps or inflammatory bowel disease are at increased risk.  This letter has beeen generated based on the recommendations made at the time of your procedure. If you feel that in your particular situation this may no longer apply, please contact our office.  Please call our office at [phone] to schedule this appointment or to update your records at your earliest convenience.  Thank you for cooperating with [place] to provide you with the very best care possible.   Sincerely,  Vania Rea. Jarold Motto, M.D.  [facility] [phone]

## 2010-09-28 NOTE — Assessment & Plan Note (Signed)
Summary: pt fasting/nta   Vital Signs:  Patient profile:   75 year old female Height:      62.25 inches Weight:      191 pounds BMI:     34.78 Temp:     98.0 degrees F oral Pulse rate:   39 / minute Resp:     14 per minute BP sitting:   126 / 80  (left arm) Cuff size:   large  Vitals Entered By: Shonna Chock (September 16, 2009 8:35 AM) CC: CPX with fasting labs  Comments REVIEWED MED LIST, PATIENT AGREED DOSE AND INSTRUCTION CORRECT    Primary Care Provider:  Dr. Alwyn Ren  CC:  CPX with fasting labs .  History of Present Illness: Amy Dominguez is here for a physical; she describes decreased stamina gradually over past  3-4 months . Heart rate chronically slow; formerly in 50s ,then occasionally in 40s . See EKG ; profound nodal bradycardia with rate of 39.She is not on Beta blocker; on  Amlodipine ,CCB. Slow rate "present before Amlodipine". PMH of pulse in 30s on Holter 2007?  Allergies: 1)  ! Tylenol  Past History:  Past Medical History: ARTHRALGIA (ICD-719.40) EDEMA- LOCALIZED (ICD-782.3) UTERINE POLYP (ICD-621.0), PMH of  COMMON MIGRAINE (ICD-346.10), PMH of Colonic polyps, hx of Hypertension Mild AS Hyperlipidemia: LDL goal = < 160 nased on NMR Hypothyroidism Skin cancer, hx of  RUE 06/2009, Dr Retta Diones   Past Surgical History: Tubal ligation; uterine polypectomy 1998; toe & foot surgeries; G5 P4; Colon polypectomy 2005 & 2008, Dr Jarold Motto, due 2011 Hemorrhoidectomy Tonsillectomy  Family History: Father: AV  disease , MI in 77s Mother: CVA, AS, MV surgery, RHD Siblings: sister breast CA 2 daughters protein S deficiency (pt has normal Protein S) DVT- 1  Daughter  Social History: sodium restriction , no added salt  Married Alcohol use-yes: socially Retired Charity fundraiser Former smoker:quit in 2008.  "Smoked most of adult life". Pt states that she was only a social smoker,up to 3/4-1 ppd for 30 years .   Review of Systems General:  Denies chills, fever, sweats, and  weight loss. Eyes:  Denies blurring, double vision, and vision loss-both eyes. ENT:  Complains of postnasal drainage; denies decreased hearing, difficulty swallowing, and hoarseness. CV:  Complains of shortness of breath with exertion and swelling of feet; denies chest pain or discomfort, difficulty breathing at night, difficulty breathing while lying down, leg cramps with exertion, lightheadness, near fainting, and palpitations. Resp:  Denies cough, coughing up blood, shortness of breath, sputum productive, and wheezing. GI:  Denies abdominal pain, bloody stools, dark tarry stools, and indigestion. GU:  Denies discharge, dysuria, and hematuria. MS:  Complains of joint pain and low back pain; denies joint redness, joint swelling, mid back pain, and thoracic pain; Aleve & exercises help. Derm:  Denies changes in nail beds, dryness, lesion(s), and rash. Neuro:  Denies brief paralysis, disturbances in coordination, numbness, poor balance, tingling, tremors, and weakness. Psych:  Denies anxiety and depression. Endo:  Denies cold intolerance, excessive hunger, excessive thirst, excessive urination, and heat intolerance. Heme:  Denies abnormal bruising and bleeding. Allergy:  Denies itching eyes and sneezing.  Physical Exam  General:  well-nourished,in no acute distress; alert,appropriate and cooperative throughout examination Head:  Normocephalic and atraumatic without obvious abnormalities. No apparent alopecia Eyes:  No corneal or conjunctival inflammation noted. Perrla. Funduscopic exam benign, without hemorrhages, exudates or papilledema. Ears:  External ear exam shows no significant lesions or deformities.  Otoscopic examination reveals clear canals,  tympanic membranes are intact bilaterally without bulging, retraction, inflammation or discharge. Hearing is grossly normal bilaterally. Nose:  External nasal examination shows no deformity or inflammation. Nasal mucosa are pink and moist without  lesions or exudates. Mouth:  Oral mucosa and oropharynx without lesions or exudates.  Teeth in good repair. Neck:  No deformities, masses, or tenderness noted. Lungs:  Normal respiratory effort, chest expands symmetrically. Lungs are clear to auscultation, no crackles or wheezes. Heart:  regular rhythm, bradycardia, and grade 1 basilar   /6 systolic murmur.  Carotid radiation vs bilateral bruits Abdomen:  Bowel sounds positive,abdomen soft and non-tender without masses, organomegaly or hernias noted. Genitalia:  Dr Hyacinth Meeker Msk:  No deformity or scoliosis noted of thoracic or lumbar spine.   Pulses:  R and L carotid,radial,femoral,dorsalis pedis and posterior tibial pulses are full and equal bilaterally Extremities:  No clubbing, cyanosis, edema. Minor OA finger deformities  noted with normal full range of motion of all joints.  Mild crepitus of knees Neurologic:  alert & oriented X3 and DTRs symmetrical and normal.   Skin:  Intact without suspicious lesions or rashes. R deltoid op scar Cervical Nodes:  No lymphadenopathy noted Axillary Nodes:  No palpable lymphadenopathy Psych:  memory intact for recent and remote, normally interactive, and good eye contact.     Impression & Recommendations:  Problem # 1:  ROUTINE GENERAL MEDICAL EXAM@HEALTH  CARE FACL (ICD-V70.0)  Orders: EKG w/ Interpretation (93000) Venipuncture (30865) TLB-Lipid Panel (80061-LIPID) TLB-BMP (Basic Metabolic Panel-BMET) (80048-METABOL) TLB-CBC Platelet - w/Differential (85025-CBCD) TLB-Hepatic/Liver Function Pnl (80076-HEPATIC) TLB-TSH (Thyroid Stimulating Hormone) (84443-TSH) TLB-T4 (Thyrox), Free [phone])  Problem # 2:  TIREDNESS (ICD-780.79)  probably due to #3  Orders: Venipuncture (28413) TLB-CBC Platelet - w/Differential (85025-CBCD) TLB-TSH (Thyroid Stimulating Hormone) (84443-TSH) TLB-T4 (Thyrox), Free [phone]) Cardiology Referral (Cardiology)  Problem # 3:  BRADYARRHYTHMIA  (ICD-427.89)  Nodal bradycardia Her updated medication list for this problem includes:    Bayer Aspirin 325 Mg Tabs (Aspirin) .Marland Kitchen... 1 once daily  Orders: EKG w/ Interpretation (93000) Cardiology Referral (Cardiology)  Problem # 4:  HYPOTHYROIDISM (ICD-244.9)  PMH of   Orders: Venipuncture (53664)  Problem # 5:  HYPERLIPIDEMIA (ICD-272.4)  Orders: Venipuncture (40347)  Problem # 6:  HYPERTENSION (ICD-401.9) Controlled Her updated medication list for this problem includes:    Furosemide 40 Mg Tabs (Furosemide) .Marland Kitchen... 1 by mouth once daily and 1/2 once daily as needed    Amlodipine Besylate 10 Mg Tabs (Amlodipine besylate) .Marland Kitchen... Patient takes 20mg  daily    Lisinopril 20 Mg Tabs (Lisinopril) .Marland Kitchen... 1 by mouth once daily  Orders: EKG w/ Interpretation (93000) Venipuncture (42595)  Complete Medication List: 1)  Furosemide 40 Mg Tabs (Furosemide) .Marland Kitchen.. 1 by mouth once daily and 1/2 once daily as needed 2)  Amlodipine Besylate 10 Mg Tabs (Amlodipine besylate) .... Patient takes 20mg  daily 3)  Lisinopril 20 Mg Tabs (Lisinopril) .Marland Kitchen.. 1 by mouth once daily 4)  Klor-con M10 10 Meq Cr-tabs (Potassium chloride crys cr) .Marland Kitchen.. 1 once daily as needed, if extra half of lasix taken 5)  Bayer Aspirin 325 Mg Tabs (Aspirin) .Marland Kitchen.. 1 once daily 6)  Zyrtec Allergy 10 Mg Tabs (Cetirizine hcl) .... Once daily 7)  Estro Van  .... Qd 8)  Anti-oxidant Tabs (Multiple vitamin) .... Once daily 9)  Caltrate 600+d 600-400 Mg-unit Tabs (Calcium carbonate-vitamin d) .... 2 by mouth am, 1 by mouth pm 10)  Sam-e 400 Mg Tabs (S-adenosylmethionine) .Marland Kitchen.. 1 by mouth once daily  Patient Instructions: 1)  Consume LESS  THAN 25 grams of sugar/ day from LABELED foods & drinks. Your LDL goal = < 160 Prescriptions: FUROSEMIDE 40 MG  TABS (FUROSEMIDE) 1 by mouth once daily and 1/2 once daily as needed  #90 x 3   Entered and Authorized by:   [name] MD   Signed by:   [name] MD on 09/16/2009   Method used:    Print then Give to Patient   RxID:   [id number] AMLODIPINE BESYLATE 10 MG TABS (AMLODIPINE BESYLATE) PATIENT TAKES 20MG  DAILY  #90 x 3   Entered and Authorized by:   [name] MD   Signed by:   [name] MD on 09/16/2009   Method used:   Print then Give to Patient   RxID:   [id number] LISINOPRIL 20 MG TABS (LISINOPRIL) 1 by mouth once daily  #180 x 3   Entered and Authorized by:   [name] MD   Signed by:   [name] MD on 09/16/2009   Method used:   Print then Give to Patient   RxID:   [id number]

## 2010-09-28 NOTE — Cardiovascular Report (Signed)
Summary: Office Visit   Office Visit   Imported By: Roderic Ovens 07/01/2010 16:12:31  _____________________________________________________________________  External Attachment:    Type:   Image     Comment:   External Document

## 2010-09-28 NOTE — Consult Note (Signed)
Summary: [facility]  [facility]   Imported By: Lanelle Bal 06/22/2010 10:57:23  _____________________________________________________________________  External Attachment:    Type:   Image     Comment:   External Document

## 2010-09-28 NOTE — Progress Notes (Signed)
Summary:  PPM Implant instructions   Phone Note Outgoing Call   Call placed by: Duncan Dull, RN, BSN,  October 05, 2009 11:40 AM Call placed to: Patient Summary of Call: Called patient and left message on machine for pt to call me back To discuss procedure date, time and instructions Duncan Dull, RN, BSN  October 05, 2009 11:40 AM

## 2010-09-28 NOTE — Letter (Signed)
Summary: Remote Device Check  [facility], Main Office  [address]   [place], [place] [address]   Phone: [phone]  Fax: [phone]     April 19, 2010 MRN: [id number]   Amy Dominguez [address] [place], [place]  [address]   Dear Ms. Shehan,   Your remote transmission was recieved and reviewed by your physician.  All diagnostics were within normal limits for you.   __X____Your next office visit is scheduled for:   October with Dr Graciela Husbands. Please call our office to schedule an appointment.    Sincerely,  Vella Kohler

## 2010-09-28 NOTE — Miscellaneous (Signed)
Summary: Orders Update   Clinical Lists Changes  Orders: Added new Referral order of Radiology Referral (Radiology) - Signed Added new Referral order of Nephrology Referral (Nephro) - Signed

## 2010-09-30 ENCOUNTER — Encounter: Payer: Self-pay | Admitting: Internal Medicine

## 2010-09-30 ENCOUNTER — Ambulatory Visit: Admit: 2010-09-30 | Payer: Self-pay | Admitting: Internal Medicine

## 2010-09-30 NOTE — Progress Notes (Signed)
Summary: calling about medication   Phone Note Call from Patient Call back at Home Phone [phone] Call back at [phone]   Caller: Patient Summary of Call: Pt calling regarding medication,informed pt that nusre will call her later on today Initial call taken by: Judie Grieve,  August 10, 2010 1:38 PM  Follow-up for Phone Call        pt wants to have injections into spinal column due to spinal stenosis by Dr. Ethelene Hal at [facility] orth[facility]ics. Dr. Ethelene Hal wants to know if she can be off pradaxa for five days before hand. the procedure is scheduled for 08/17/10.  Follow-up by: Claris Gladden RN,  August 10, 2010 6:33 PM     Appended Document: calling about medication left msg to adv pt ok to be off pradaxa for 5 days. Claris Gladden, RN, BSN

## 2010-09-30 NOTE — Letter (Signed)
Summary: [facility]  [facility]   Imported By: Lanelle Bal 08/25/2010 07:48:07  _____________________________________________________________________  External Attachment:    Type:   Image     Comment:   External Document

## 2010-09-30 NOTE — Assessment & Plan Note (Signed)
Summary: EST //YEARLY//PH   Vital Signs:  Patient profile:   75 year old female Height:      52 inches Weight:      188.6 pounds BMI:     49.22 Temp:     98.2 degrees F oral Pulse rate:   76 / minute Resp:     14 per minute BP sitting:   118 / 78  (left arm) Cuff size:   large  Vitals Entered By: Shonna Chock CMA (September 20, 2010 8:25 AM) CC: CPX-? labs (patient had 1/2 of a sandwich). Heart followed by Cardiologist (patient with pacemaker)  Vision Screening:      Vision Comments: Patient states she gets regular eye exams by Dr.Miller 1-2 x yearly   Primary Care Provider:  Dr. Alwyn Ren  CC:  CPX-? labs (patient had 1/2 of a sandwich). Heart followed by Cardiologist (patient with pacemaker).  History of Present Illness: Here for Medicare AWV: 1.Risk factors based on Past M, S, F history:see Diagnoses; chart updated 2.Physical Activities: no program yet due to knees(DJD) 3.Depression/mood: no issues 4.Hearing:whisper heard @ 6 ft 5.ADL's:no limitations  6.Fall Risk: no issues 7.Home Safety:home safety proofed  8.Height, weight, &visual acuity:see VS, Dr Blima Ledger seen every 6-12 months 9.Counseling: POA & Living Will in place 10.Labs ordered based on risk factors: see Orders 11.Referral Coordination: BMD ? due; Colonoscopy & mammograms.  12.Care Plan: see Instructions 13.Cognitive Assessment: Oriented X 3 ;  memory & recall  intact   ; "WORLD " spelled   backwards; mood & affect normal.   Preventive Screening-Counseling & Management  Alcohol-Tobacco     Alcohol drinks/day: <1     Smoking Status: current     Packs/Day: 1/10      Pack years: 30 pack years  Caffeine-Diet-Exercise     Caffeine use/day: 2-3 cups of 1/2 & 1/2  Hep-HIV-STD-Contraception     Dental Visit-last 6 months yes     Sun Exposure-Excessive: no  Safety-Violence-Falls     Seat Belt Use: yes     Smoke Detectors: yes      Blood Transfusions:  no.        Travel History:  04/2010 [place].     Current Medications (verified): 1)  Lisinopril 20 Mg Tabs (Lisinopril) .... Once Daily 2)  Zyrtec Allergy 10 Mg Tabs (Cetirizine Hcl) .... Once Daily 3)  Pradaxa 150 Mg Caps (Dabigatran Etexilate Mesylate) .... Take 1 Capsule By Mouth Two Times A Day 4)  Torsemide 20 Mg Tabs (Torsemide) .... Once Daily 5)  Klor-Con M20 20 Meq Cr-Tabs (Potassium Chloride Crys Cr) .... One Daily 6)  Hydrogesic 5-500 Mg Caps (Hydrocodone-Acetaminophen) .Marland Kitchen.. 1 Tab, Two Times A Day As Needed 7)  Sam-E 400 Mg Tabs (S-Adenosylmethionine) .Marland Kitchen.. 1 By Mouth Once Daily 8)  Caltrate 600+d 600-400 Mg-Unit Tabs (Calcium Carbonate-Vitamin D) .Marland Kitchen.. 1 By Mouth Once Daily 9)  Estrovan .Marland Kitchen.. 1 By Mouth Once Daily  Allergies: 1)  ! Tylenol  Past History:  Past Medical History: ARTHRALGIA (ICD-719.40) UTERINE POLYP (ICD-621.0), PMH of  COMMON MIGRAINE (ICD-346.10), PMH of Colonic polyps,PMH  of, Hyperplastic & Adenomatous Hypertension Mild Aortic Stenosis, mild Mitral Regurgitation on ECHO Hyperlipidemia: LDL goal = < 160 nased on NMR Lipoprofile  Hypothyroidism, PMH of Skin cancer, PMH  of  RUE, ? squamous cel , 06/2009, Dr [name]  Medtronic Adapta; Spinal Stenosis LS spine   Past Surgical History: Tubal ligation; uterine polypectomy 1998; toe & foot surgeries; G5 P 4; Colon polypectomy 2005 &  2008, Dr Jarold Motto,   negative  2011, due 2021 Hemorrhoidectomy Tonsillectomy Medtronic Adapta  Family History: Father: AV  disease , MI in 27s Mother: CVA, AS, MV surgery, RHD Siblings: sister: breast cancer 2 daughters: protein S deficiency ([name]  has normal Protein S) ;  Daughter: DVT  Social History: no added salt  Married Alcohol use-yes: socially Retired Charity fundraiser Smoking Status:  current Packs/Day:  1/10  Caffeine use/day:  2-3 cups of 1/2 & 1/2 Dental Care w/in 6 mos.:  yes Sun Exposure-Excessive:  no Seat Belt Use:  yes Blood Transfusions:  no  Review of Systems       The patient complains of  dyspnea on exertion.  The patient denies anorexia, fever, weight loss, weight gain, vision loss, decreased hearing, hoarseness, chest pain, syncope, prolonged cough, hemoptysis, abdominal pain, melena, hematochezia, severe indigestion/heartburn, hematuria, suspicious skin lesions, unusual weight change, abnormal bleeding, enlarged lymph nodes, and angioedema.         Dr Arlyn Leak has released her fom  ongoing monitor by Nephrology; monitor will continue through this office.  Physical Exam  General:  well-nourished;alert,appropriate and cooperative throughout examination Head:  Normocephalic and atraumatic without obvious abnormalities. No apparent alopecia or balding. Eyes:  No corneal or conjunctival inflammation noted.  Perrla. Funduscopic exam benign, without hemorrhages, exudates or papilledema.  Ears:  External ear exam shows no significant lesions or deformities.  Otoscopic examination reveals clear canals, tympanic membranes are intact bilaterally without bulging, retraction, inflammation or discharge. Hearing is grossly normal bilaterally. Nose:  External nasal examination shows no deformity or inflammation. Nasal mucosa are pink and moist without lesions or exudates. Mouth:  Oral mucosa and oropharynx without lesions or exudates.  Teeth in good repair. Neck:  No deformities, masses, or tenderness noted. Physiologic asymmetry of thyroid Breasts:  Dr Hyacinth Meeker Lungs:  Normal respiratory effort, chest expands symmetrically. Lungs are clear to auscultation, no crackles or wheezes. Heart:  normal rate, regular rhythm, no gallop, no rub, no JVD, no HJR, and grade 1 /6 systolic murmur @ R base.   Abdomen:  Bowel sounds positive,abdomen soft and non-tender without masses, organomegaly or hernias noted. Rectal:  S/P colonoscopy  Genitalia:  [name], Gyn Pulses:  R and L carotid,radial,dorsalis pedis and posterior tibial pulses are full and equal bilaterally Extremities:  No clubbing, cyanosis,  edema, or deformity noted with normal full range of motion of all joints.  Crepitus of knees; OA of DIP joints  Neurologic:  alert & oriented X3, strength normal in all extremities, and DTRs symmetrical and normal.   Skin:  Intact without suspicious lesions or rashes Cervical Nodes:  No lymphadenopathy noted Axillary Nodes:  No palpable lymphadenopathy Psych:  memory intact for recent and remote, normally interactive, good eye contact, and not anxious appearing.     Impression & Recommendations:  Problem # 1:  PREVENTIVE HEALTH CARE (ICD-V70.0)  Orders: Medicare -1st Annual Wellness Visit [phone])  Problem # 2:  HYPOTHYROIDISM (ICD-244.9) PMH of  Problem # 3:  RENAL INSUFFICIENCY (ICD-588.9) PMH of  Problem # 4:  HYPERLIPIDEMIA (ICD-272.4)  Problem # 5:  HYPERTENSION (ICD-401.9)  Her updated medication list for this problem includes:    Lisinopril 20 Mg Tabs (Lisinopril) ..... Once daily    Torsemide 20 Mg Tabs (Torsemide) ..... Once daily  Complete Medication List: 1)  Lisinopril 20 Mg Tabs (Lisinopril) .... Once daily 2)  Zyrtec Allergy 10 Mg Tabs (Cetirizine hcl) .... Once daily 3)  Pradaxa 150 Mg Caps (Dabigatran etexilate mesylate) .Marland KitchenMarland KitchenMarland Kitchen  Take 1 capsule by mouth two times a day 4)  Torsemide 20 Mg Tabs (Torsemide) .... Once daily 5)  Klor-con M20 20 Meq Cr-tabs (Potassium chloride crys cr) .... One daily 6)  Hydrogesic 5-500 Mg Caps (Hydrocodone-acetaminophen) .Marland Kitchen.. 1 tab, two times a day as needed 7)  Sam-e 400 Mg Tabs (S-adenosylmethionine) .Marland Kitchen.. 1 by mouth once daily 8)  Caltrate 600+d 600-400 Mg-unit Tabs (Calcium carbonate-vitamin d) .Marland Kitchen.. 1 by mouth once daily 9)  Estrovan  .Marland Kitchen.. 1 by mouth once daily  Other Orders: Tdap => 59yrs IM (04540) Admin 1st Vaccine (98119)  Patient Instructions: 1)  Consider annual PAP. BMD every 25 months as discussed. Please schedule fasting labs; see Diagnoses for Codes:  2)  BMP; 3)  Hepatic Panel; 4)  Lipid Panel ; 5)  TSH ; 6)  CBC  w/ Diff . 7)  Check your Blood Pressure regularly. If it is above: 135/85 ON AVERAGE  you should make an appointment.   Orders Added: 1)  Medicare -1st Annual Wellness Visit [G0438] 2)  Est. Patient Level III [14782] 3)  Tdap => 10yrs IM [90715] 4)  Admin 1st Vaccine [90471]   Immunization History:  Pneumovax Immunization History:    Pneumovax:  historical (09/12/2005)  Immunizations Administered:  Tetanus Vaccine:    Vaccine Type: Tdap    Site: right deltoid    Mfr: GlaxoSmithKline    Dose: 0.5 ml    Route: IM    Given by: Shonna Chock CMA    Exp. Date: 06/17/2012    Lot #: [id number]    VIS given: 07/16/08 version given September 20, 2010.   Immunization History:  Pneumovax Immunization History:    Pneumovax:  Historical (09/12/2005)  Immunizations Administered:  Tetanus Vaccine:    Vaccine Type: Tdap    Site: right deltoid    Mfr: GlaxoSmithKline    Dose: 0.5 ml    Route: IM    Given by: Shonna Chock CMA    Exp. Date: 06/17/2012    Lot #: [id number]    VIS given: 07/16/08 version given September 20, 2010.

## 2010-10-02 ENCOUNTER — Encounter: Payer: Self-pay | Admitting: Internal Medicine

## 2010-10-06 NOTE — Letter (Signed)
Summary: [facility]   Imported By: Lanelle Bal 09/30/2010 08:32:24  _____________________________________________________________________  External Attachment:    Type:   Image     Comment:   External Document

## 2010-10-06 NOTE — Miscellaneous (Signed)
Summary: corrected device information  Clinical Lists Changes  Observations: Added new observation of PPMLEADSER1: [id number] (10/02/2010 9:52)      PPM Specifications Following MD:  Sherryl Manges, MD     Referring MD:  HOPPER PPM Vendor:  Medtronic     PPM Model Number:  [id number]     PPM Serial Number:  [id number] PPM DOI:  10/23/2009     PPM Implanting MD:  Sherryl Manges, MD  Lead 1    Location: RA     DOI: 10/23/2009     Model #: [id number]     Serial #: [id number]     Status: active Lead 2    Location: RV     DOI: 10/23/2009     Model #: [id number]     Serial #: [id number]     Status: active  Magnet Response Rate:  BOL 85 ERI 65  Indications:  SSS, BRADYCARDIA   PPM Follow Up Pacer Dependent:  No      Episodes Coumadin:  No  Parameters Mode:  MVP(R)     Lower Rate Limit:  60     Upper Rate Limit:  130 Paced AV Delay:  150     Sensed AV Delay:  120

## 2010-10-26 ENCOUNTER — Encounter (INDEPENDENT_AMBULATORY_CARE_PROVIDER_SITE_OTHER): Payer: Self-pay | Admitting: *Deleted

## 2010-10-26 ENCOUNTER — Encounter (INDEPENDENT_AMBULATORY_CARE_PROVIDER_SITE_OTHER): Payer: Medicare Other | Admitting: Internal Medicine

## 2010-10-26 ENCOUNTER — Encounter: Payer: Self-pay | Admitting: Internal Medicine

## 2010-10-26 DIAGNOSIS — R0602 Shortness of breath: Secondary | ICD-10-CM

## 2010-10-26 DIAGNOSIS — I495 Sick sinus syndrome: Secondary | ICD-10-CM

## 2010-10-26 DIAGNOSIS — I4891 Unspecified atrial fibrillation: Secondary | ICD-10-CM

## 2010-10-26 DIAGNOSIS — Z95810 Presence of automatic (implantable) cardiac defibrillator: Secondary | ICD-10-CM

## 2010-11-01 ENCOUNTER — Telehealth (INDEPENDENT_AMBULATORY_CARE_PROVIDER_SITE_OTHER): Payer: Self-pay | Admitting: *Deleted

## 2010-11-04 NOTE — Assessment & Plan Note (Signed)
Summary: [facility]      Allergies Added:   Referring Provider:  Dr. Antoine Poche Primary Provider:  Dr. Alwyn Ren  CC:  pacer check.  History of Present Illness: Amy Dominguez is seen in followup for pacemaker implantation for symptomatic sinus node dysfunction. She has a history of mild aortic stenosis.  Her dyspnea was  much improved. She is able to climb a flight of stairs still but there is more shortness of breath and exercise intolerance.    her thromboembolic risk factors are noted for hypertension age and gender for CHADS VASC score of 3    Current Medications (verified): 1)  Lisinopril 20 Mg Tabs (Lisinopril) .... Once Daily 2)  Zyrtec Allergy 10 Mg Tabs (Cetirizine Hcl) .... Once Daily 3)  Pradaxa 150 Mg Caps (Dabigatran Etexilate Mesylate) .... Take 1 Capsule By Mouth Two Times A Day 4)  Torsemide 20 Mg Tabs (Torsemide) .... Once Daily 5)  Klor-Con M20 20 Meq Cr-Tabs (Potassium Chloride Crys Cr) .... One Daily 6)  Sam-E 400 Mg Tabs (S-Adenosylmethionine) .Marland Kitchen.. 1 By Mouth Once Daily 7)  Caltrate 600+d 600-400 Mg-Unit Tabs (Calcium Carbonate-Vitamin D) .Marland Kitchen.. 1 By Mouth Once Daily 8)  Estrovan .Marland Kitchen.. 1 By Mouth Once Daily  Allergies (verified): 1)  ! Tylenol  Past History:  Past Medical History: Last updated: 09/20/2010 ARTHRALGIA (ICD-719.40) UTERINE POLYP (ICD-621.0), PMH of  COMMON MIGRAINE (ICD-346.10), PMH of Colonic polyps,PMH  of, Hyperplastic & Adenomatous Hypertension Mild Aortic Stenosis, mild Mitral Regurgitation on ECHO Hyperlipidemia: LDL goal = < 160 nased on NMR Lipoprofile  Hypothyroidism, PMH of Skin cancer, PMH  of  RUE, ? squamous cel , 06/2009, Dr [name]  Medtronic Adapta; Spinal Stenosis LS spine   Past Surgical History: Last updated: 09/20/2010 Tubal ligation; uterine polypectomy 1998; toe & foot surgeries; G5 P 4; Colon polypectomy 2005 & 2008, Dr Jarold Motto,   negative  2011, due 2021 Hemorrhoidectomy Tonsillectomy Medtronic  Adapta  Family History: Last updated: 09/20/2010 Father: AV  disease , MI in 25s Mother: CVA, AS, MV surgery, RHD Siblings: sister: breast cancer 2 daughters: protein S deficiency ([name]  has normal Protein S) ;  Daughter: DVT  Social History: Last updated: 09/20/2010 no added salt  Married Alcohol use-yes: socially Retired Charity fundraiser  Vital Signs:  Patient profile:   75 year old female Height:      52 inches Weight:      191 pounds BMI:     49.84 Pulse rate:   88 / minute Pulse rhythm:   regular BP sitting:   127 / 81  (left arm) Cuff size:   regular  Vitals Entered By: Judithe Modest CMA (October 26, 2010 11:23 AM)  Physical Exam  General:  The patient was alert and oriented in no acute distress. HEENT Normal.  Neck veins were flat, carotids were brisk.  Lungs were clear.  Heart sounds were regular without murmurs or gallops.  Abdomen was soft with active bowel sounds. There is no clubbing cyanosis or edema. Skin Warm and dry    PPM Specifications Following MD:  Sherryl Manges, MD     Referring MD:  HOPPER PPM Vendor:  Medtronic     PPM Model Number:  [id number]     PPM Serial Number:  [id number] [facility] PPM DOI:  10/23/2009     PPM Implanting MD:  Sherryl Manges, MD  Lead 1    Location: RA     DOI: 10/23/2009     Model #: [id number]     Serial #:  [id number]     Status: active Lead 2    Location: RV     DOI: 10/23/2009     Model #: 6440     Serial #: [id number]     Status: active  Magnet Response Rate:  BOL 85 ERI 65  Indications:  SSS, BRADYCARDIA   PPM Follow Up Pacer Dependent:  No      Episodes Coumadin:  No  Parameters Mode:  MVP(R)     Lower Rate Limit:  60     Upper Rate Limit:  130 Paced AV Delay:  150     Sensed AV Delay:  120  Impression & Recommendations:  Problem # 1:  SINUS NODE DYSFUNCTION   HEART RATES<40 (ICD-427.89) Assessment Comment Only his status post pacemaker implantation with reasonable heart rate excursion. There is however the ADL bump in her  device was reprogrammed. We increased her basal rate from 70-75 an activated sleep mode. We also changed her rate response. She is  to let us know next week as she feels Her updated medication list for this problem includes:    Lisinopril 20 Mg Tabs (Lisinopril) ..... Once daily  Problem # 2:  ATRIAL FLUTTER (ICD-427.32) the patient has had a trivial amount of atrial fibrillation  Problem # 3:  HYPERTENSION (ICD-401.9) well controlled on her current regime we will refill her medications today. Her updated medication list for this problem includes:    Lisinopril 20 Mg Tabs (Lisinopril) ..... Once daily    Torsemide 20 Mg Tabs (Torsemide) ..... Once daily  Problem # 4:  CARDIAC PACEMAKER IN SITU-MEDTRONIC ADAPTA (ICD-V45.01) Device parameters and data were reviewed and   changes were made as noted above  Problem # 5:  DYSPNEA ON EXERTION (ICD-786.09) as above; hopefully we can mitigate some of this with reprogramming Her updated medication list for this problem includes:    Lisinopril 20 Mg Tabs (Lisinopril) ..... Once daily    Torsemide 20 Mg Tabs (Torsemide) ..... Once daily

## 2010-11-04 NOTE — Miscellaneous (Signed)
Summary: Orders Update  Clinical Lists Changes  Observations: Added new observation of PI CARDIO: Your physician recommends that you continue on your current medications as directed. Please refer to the Current Medication list given to you today. Your physician wants you to follow-up in:  YEAR WITH DR Logan Bores will receive a reminder letter in the mail two months in advance. If you don't receive a letter, please call our office to schedule the follow-up appointment. (10/26/2010 12:34)      Patient Instructions: 1)  Your physician recommends that you continue on your current medications as directed. Please refer to the Current Medication list given to you today. 2)  Your physician wants you to follow-up in:  YEAR WITH DR Logan Bores will receive a reminder letter in the mail two months in advance. If you don't receive a letter, please call our office to schedule the follow-up appointment.

## 2010-11-04 NOTE — Cardiovascular Report (Signed)
Summary: Office Visit   Office Visit   Imported By: Roderic Ovens 10/29/2010 12:17:51  _____________________________________________________________________  External Attachment:    Type:   Image     Comment:   External Document

## 2010-11-09 ENCOUNTER — Encounter (INDEPENDENT_AMBULATORY_CARE_PROVIDER_SITE_OTHER): Payer: Self-pay | Admitting: *Deleted

## 2010-11-09 NOTE — Progress Notes (Signed)
Summary: pt calling to give results   Phone Note Call from Patient Call back at Home Phone [phone]   Caller: Patient Reason for Call: Talk to Nurse, Talk to Doctor Summary of Call: pt was calling with results Initial call taken by: Omer Jack,  November 01, 2010 12:35 PM  Follow-up for Phone Call        called pt and left message on machine. Vella Kohler  November 02, 2010 8:46 AM  Additional Follow-up for Phone Call Additional follow up Details #1::        pt feels much better with pacer changes . Vella Kohler  November 02, 2010 11:20 AM

## 2010-11-09 NOTE — Progress Notes (Signed)
Summary: [facility]  [facility]   Imported By: Earl Many 10/29/2010 10:25:16  _____________________________________________________________________  External Attachment:    Type:   Image     Comment:   External Document

## 2010-11-09 NOTE — Progress Notes (Signed)
Summary: [facility]  [facility]   Imported By: Earl Many 10/29/2010 10:30:18  _____________________________________________________________________  External Attachment:    Type:   Image     Comment:   External Document

## 2010-11-16 NOTE — Letter (Signed)
Summary: Appointment - Missed  [facility], Main Office  [address]   [place], [place] [address]   Phone: [phone]  Fax: [phone]     November 09, 2010 MRN: [id number]   Amy Dominguez [address] [place], [place]  [address]   Dear [name],  Our records indicate you missed your appointment on 09-30-2010  with Dr. Graciela Husbands .                                    It is very important that we reach you to reschedule this appointment. We look forward to participating in your health care needs. Please contact us at the number listed above at your earliest convenience to reschedule this appointment.     Sincerely,     Lorne Skeens  [facility] Scheduling Team

## 2010-11-25 ENCOUNTER — Encounter: Payer: Self-pay | Admitting: Internal Medicine

## 2010-11-26 ENCOUNTER — Encounter: Payer: Self-pay | Admitting: Internal Medicine

## 2010-11-26 ENCOUNTER — Ambulatory Visit (INDEPENDENT_AMBULATORY_CARE_PROVIDER_SITE_OTHER): Payer: Medicare Other | Admitting: Internal Medicine

## 2010-11-26 DIAGNOSIS — J019 Acute sinusitis, unspecified: Secondary | ICD-10-CM

## 2010-11-26 MED ORDER — AMOXICILLIN 500 MG PO CAPS: 500.0000 mg | ORAL_CAPSULE | Freq: Three times a day (TID) | ORAL | Status: AC

## 2010-11-26 MED ORDER — FLUTICASONE PROPIONATE 50 MCG/ACT NA SUSP
1.0000 | Freq: Every day | NASAL | Status: DC
Start: 2010-11-26 — End: 2012-05-24

## 2010-11-26 NOTE — Patient Instructions (Signed)
Drink as much nondairy fluids as you  can; milk or dairy will thicken secretions. Continue to use the Neti pot  up to twice a day as needed. This can be followed by the fluticasone nasal spray using the crossover technique we discussed.

## 2010-11-26 NOTE — Progress Notes (Signed)
  Subjective:    Patient ID: Amy Dominguez, female    DOB: 06-30-1936, 75 y.o.   MRN: [id number]  HPI  Symptoms began 2 weeks ago as extrinsic allergic symptoms with  nasal congestion, itchy eyes, sneezing and rhinitis. She  took Zyrtec  for the symptoms; this is almost a maintenance medication for her.  Of note is an allergy to branded Tylenol; she believes it's the binding agents used in the product as she can take acetaminophen.   Review of Systems The major and minor symptoms of rhinosinusitis were reviewed. These include nasal congestion/obstruction; nasal purulence; facial pain; anosmia; fatigue; fever; headache; halitosis; earache and dental pain. Duration of symptoms  as well as treatment to date were verified.  She has a marked pressure without pain in the frontal sinus and maxillary sinus areas. She is having purulent nasal discharge with scant. On sputum at times. The major volume of secretions  is from the head not the chest.  .      Objective:   Physical ExamOn exam she is in no distress. There is no sign of conjunctivitis. Nares are minimally erythematous without exudate.  The oral pharynx reveals no exudate or signiicant erythema. No hygiene is good.  The otic canals are clear the tympanic membranes are nonerythematous.  Chest is clear to auscultation.  She has no lymphadenopathy about the neck or axilla. Her sleep        Assessment & Plan:  #1 rhinosinusitis; the minimal sputum production is probably from postnasal drainage rather than bronchitis.  Plan: A Neti pot  would be recommended one to 2 times a day for cleansing. This can be followed by a topical nasal steroid to decrease inflammation. Antibiotics will be initiated.

## 2011-01-11 NOTE — Assessment & Plan Note (Signed)
[facility]                            CARDIOLOGY OFFICE NOTE   NAME:Amy Dominguez, Amy Dominguez                    MRN:          [id number]  DATE:10/17/2008                            DOB:          Feb 07, 1936    PRIMARY CARE PHYSICIAN:  Titus Dubin. Alwyn Ren, MD,FACP,FCCP   REASON FOR PRESENTATION:  Evaluate the patient with hypertension and  dyspnea.   HISTORY OF PRESENT ILLNESS:  The patient returns for followup of the  above.  Since I last saw her, her blood pressures have been pretty well  controlled.  However, she stopped taking the p.m. lisinopril because she  noticed in the morning she was having back pain.  When she forgot to  take the medicine, her back pain would go away and so, she found this to  be reproducible.  She stopped the evening lisinopril, but still takes  the morning.  She takes the amlodipine as listed.  She does have  intermittent edema, swelling of her hands and feet for which she takes  Lasix.  She has also been getting some progressive dyspnea.  She notices  this now after one flight of stairs.  It used to be two.  She is still  doing her exercises and curves, however, she notices the dyspnea.  She  is not having any PND or orthopnea.  She has had no palpitations,  presyncope, or syncope.   Of note, we did an echocardiogram last year, which demonstrated well-  preserved ejection fraction, but some evidence of mild diastolic  dysfunction.  She also had some mild aortic stenosis.   PAST MEDICAL HISTORY:  1. Mild aortic stenosis.  2. Mild hypertension.  3. Distant past history of hypothyroidism.  4. Toe joint replacement.  5. Tubal ligation.  6. Uterine polyps.   ALLERGIES:  Brand name TYLENOL.   MEDICATIONS:  1. Multivitamin.  2. Remafin.  3. Vitamin E.  4. Aspirin 325 mg daily.  5. Amlodipine 5 mg daily.  6. Lasix 40 mg daily.  7. Caltrate.  8. Lisinopril 20 mg daily.   REVIEW OF SYSTEMS:  As stated in the HPI and  otherwise negative for all  other systems.   PHYSICAL EXAMINATION:  GENERAL:  The patient is in no distress.  VITAL SIGNS:  Blood pressure 150/60, heart rate 51 and regular, and  weight 196 pounds.  HEENT:  Eyes are unremarkable.  Pupils equal, round, and reactive to  light.  Fundi, not visualized.  Oral mucosa, unremarkable.  NECK:  No jugular venous distention to 45 degrees.  Carotid upstroke  brisk and symmetric.  No bruits.  Transmitted systolic murmur.  No  thyromegaly.  LYMPHATICS:  No cervical, axillary, or inguinal adenopathy.  LUNGS:  Clear to auscultation bilaterally.  BACK:  No costovertebral angle tenderness.  HEART:  PMI not displaced or sustained.  S1 and S2 within normal limits.  No S3.  No S4.  No clicks.  No rubs.  No murmurs.  ABDOMEN:  Mildly obese.  Positive bowel sounds.  Normal in frequency and  pitch.  No bruits.  No rebound.  No guarding.  No midline pulsatile  mass.  No hepatomegaly.  No splenomegaly.  SKIN:  No rashes.  No nodules.  EXTREMITIES:  A 2+ pulses throughout.  No edema.  No cyanosis.  No  clubbing.  NEUROLOGIC:  Oriented to person, place, and time.  Cranial nerves II  through XII grossly intact.  Motor grossly intact.   ASSESSMENT AND PLAN:  1. Hypertension.  Her blood pressure is slightly elevated today.  She      is going to check at home.  We are going to split her meds, so that      the amlodipine dosing is in the afternoon and she takes the      lisinopril in the morning.  She has been very sensitive to the      medications so, I am not going to up titrate it at this point.  She      will continue to watch weight and salt intake.  2. Edema.  The patient has some mild increased swelling diffusely in      her upper and lower extremities.  I am going to have her take the      Lasix 40 mg in the morning and she can take 20 mg as needed.  She      will take 10 mEq of potassium if she takes the extra 20 mg of      Lasix.  3. Dyspnea.  This is a  progressive problem for the patient.  I do not      think her aortic stenosis is any worse.  This could be related to      diastolic dysfunction.  I am going to check a BNP.  If it is well      within normal limits, then I am going to suggest that she see a      pulmonologist.  Cardiopulmonary stress testing or pulmonary      function testing might be helpful.     Rollene Rotunda, MD, [facility]  Electronically Signed    JH/MedQ  DD: 10/17/2008  DT: 10/18/2008  Job #: [id number]   cc:   Titus Dubin. Alwyn Ren, MD,FACP,FCCP

## 2011-01-11 NOTE — Assessment & Plan Note (Signed)
[facility]                            CARDIOLOGY OFFICE NOTE   NAME:Amy Dominguez, Amy Dominguez                    MRN:          [id number]  DATE:12/27/2007                            DOB:          1936-03-21    PRIMARY:  Dr. Lona Kettle.   REASON FOR PRESENTATION:  Evaluate the patient with dyspnea.   HISTORY OF PRESENT ILLNESS:  The patient presents for evaluation of  dyspnea.  She reports that last summer when she was at her home up [place]  she had an episode over about a week of sudden fluid gain.  She had  significant swelling in her legs in particular.  She was more dyspneic.  She saw somebody and started on diuretic.  She said this has improved  the edema and dyspnea but never back to baseline.  She often has some  ankle swelling.  She was having her blood pressure controlled with ACE  inhibitors but developed an ACE cough.  She has been switched to an ARB  and this has slowly resolved.  She has also noticed increased dyspnea.  This used to be after a couple flights of stairs.  She is not short of  breath after one flight of stairs.  She does go to [employer] 3 times a  week.  With this, she denies any chest discomfort, neck discomfort, arm  discomfort, activity induced nausea, vomiting, or excessive diaphoresis.  She is not having any palpitations, presyncope, or syncope.  She has no  PND or orthopnea.   The patient has had some mild aortic stenosis noted in the past.  She  has had a Holter monitor that has demonstrated some brady arrhythmias  but no sustained significant brady arrhythmias.   PAST MEDICAL HISTORY:  Mild aortic stenosis, mild hypertension, distant  past history of hypothyroidism.   PAST SURGICAL HISTORY:  Toe joint replacement, tubal ligation, uterine  polyps.   ALLERGIES:  BRAND NAME TYLENOL.   MEDICATIONS:  1. Furosemide 40 mg daily.  2. Valsartan 160 mg daily.   SOCIAL HISTORY:  The the patient is retired.  She was a Engineer, civil (consulting).   She is  married.  She has four children.  She was smoking between a pack and a  half-pack a day for 50 years but quit in 2008.   FAMILY HISTORY:  Is contributory for her mother having rheumatic heart  disease and multiple valve problems, and the dying at age 76.  She  apparently died with endocarditis.  Her father had heart problems and  died at age 69.  Brothers and sisters are healthy.   REVIEW OF SYSTEMS:  Positive for contacts, mild arthritis.  Negative for  other systems.   PHYSICAL EXAMINATION:  The patient is in no distress.  The blood pressure 159/67, heart rate 56 and regular, weight 197 pounds,  body mass index 36.  HEENT:  Eyelids unremarkable.  Pupils are equal, round, and reactive to  light and accommodation.  Fundi within normal limits.  Oral mucosa  unremarkable.  NECK:  No jugular venous distension at 45 degrees, carotid upstroke  brisk and symmetric,  thyromegaly.  Left carotid bruit.  LYMPHATICS:  No cervical, axillary, or inguinal adenopathy.  LUNGS:  Clear to auscultation bilaterally.  BACK:  No costovertebral angle tenderness.  CHEST:  Unremarkable.  HEART:  PMI not displaced or sustained, S1 and S2 within normal limits,  no S3, no S4, 2/6 apical systolic murmur, early peaking, radiating  slightly out the aortic outflow tract.  No diastolic murmurs.  ABDOMEN:  Obese, positive bowel sounds, normal in frequency and pitch,  no bruits, rebound, guarding.  No midline pulsatile mass, hepatomegaly,  splenomegaly.  SKIN:  No rashes, no nodules.  EXTREMITIES:  With 2+ pulses throughout, no edema, cyanosis, clubbing.  NEURO:  Oriented to person, place, and time, cranial nerves 2-12 grossly  intact, motor grossly intact.   The patient did have an echo today.  Preliminarily, this demonstrates  only mild aortic stenosis with a minimal gradient.  She has well-  preserved left ventricular function.  No other significant valvular  abnormalities.  There is no pulmonary  hypertension noted.  EKG sinus  bradycardia, rate 50, axis within normal limits, intervals within normal  limits, junctional escape rhythm noted with sinus bradycardia.   ASSESSMENT/PLAN:  1. Dyspnea.  The patient's dyspnea is mild to moderate but slowly      progressive.  I think the possibilities are diastolic dysfunction      plus a primary lung problem plus her weight and potential      deconditioning.  I have discussed with her the most helpful test      would probably be cardiopulmonary stress testing.  She does not      want to have this at this time as she is not having much in the way      of symptoms by her report.  The preliminary echo does not suggest      diastolic dysfunction though this could be a possibility.  She has      only mild aortic stenosis so this is unlikely to be the etiology.      We discussed salt and fluid restriction, continued use of a      diuretic.  2. Hypertension.  Blood pressure is slightly elevated today, but she      reports it is well-controlled otherwise.  She will keep a blood      pressure diary and let me know the results of this.  3. Tobacco.  I congratulated her on stopping smoking and suggest      continued abstinence.  4. Bradycardia.  The patient has mild bradycardia, but she does not      have any symptoms related to this.  This can be followed      clinically.  5. Bruit.  The patient does have a left carotid bruit versus bursas      transmitted murmur.  She is going to get carotid Doppler.  She is      going to come back and have this done when she comes back for      fasting lipid profile.   Followup.  The patient can come back and see me as needed.  Certainly,  if she has any increasing shortness of breath she will let me know.     Rollene Rotunda, MD, [facility]  Electronically Signed    JH/MedQ  DD: 12/27/2007  DT: 12/27/2007  Job #: [id number]   cc:   Titus Dubin. Alwyn Ren, MD,FACP,FCCP

## 2011-01-20 ENCOUNTER — Telehealth: Payer: Self-pay | Admitting: Internal Medicine

## 2011-01-20 NOTE — Telephone Encounter (Signed)
Pt needs pradaxa 150 mg to be [facility] [facility][phone]

## 2011-01-21 ENCOUNTER — Other Ambulatory Visit: Payer: Self-pay | Admitting: *Deleted

## 2011-01-21 MED ORDER — DABIGATRAN ETEXILATE MESYLATE 150 MG PO CAPS: 150.0000 mg | ORAL_CAPSULE | Freq: Two times a day (BID) | ORAL | Status: DC

## 2011-01-21 NOTE — Telephone Encounter (Signed)
Pt needs her meds to be call [facility] [place] today

## 2011-01-24 ENCOUNTER — Other Ambulatory Visit: Payer: Self-pay | Admitting: *Deleted

## 2011-01-24 MED ORDER — DABIGATRAN ETEXILATE MESYLATE 150 MG PO CAPS: 150.0000 mg | ORAL_CAPSULE | Freq: Two times a day (BID) | ORAL | Status: DC

## 2011-01-27 ENCOUNTER — Ambulatory Visit (INDEPENDENT_AMBULATORY_CARE_PROVIDER_SITE_OTHER): Payer: Medicare Other | Admitting: *Deleted

## 2011-01-27 DIAGNOSIS — I495 Sick sinus syndrome: Secondary | ICD-10-CM

## 2011-01-28 ENCOUNTER — Encounter: Payer: Self-pay | Admitting: Internal Medicine

## 2011-01-28 NOTE — Telephone Encounter (Signed)
Error

## 2011-02-02 NOTE — Progress Notes (Signed)
Pacer remote 

## 2011-02-18 ENCOUNTER — Other Ambulatory Visit: Payer: Self-pay | Admitting: *Deleted

## 2011-02-18 MED ORDER — POTASSIUM CHLORIDE CRYS ER 20 MEQ PO TBCR: 20.0000 meq | EXTENDED_RELEASE_TABLET | Freq: Every day | ORAL | Status: DC

## 2011-02-27 ENCOUNTER — Encounter: Payer: Self-pay | Admitting: *Deleted

## 2011-04-20 ENCOUNTER — Telehealth: Payer: Self-pay | Admitting: Cardiology

## 2011-04-20 NOTE — Telephone Encounter (Signed)
Pt needs refill on lisinopril 20mg  called [facility] [place]

## 2011-04-22 ENCOUNTER — Telehealth: Payer: Self-pay | Admitting: Cardiology

## 2011-04-22 MED ORDER — LISINOPRIL 20 MG PO TABS: 20.0000 mg | ORAL_TABLET | Freq: Every day | ORAL | Status: DC

## 2011-04-22 NOTE — Telephone Encounter (Signed)
completed

## 2011-04-22 NOTE — Telephone Encounter (Signed)
Lisinopril 20mg  qd [facility] [place], [place] phone # [phone]. pt is at her summer home in [place] and needs this called in asap

## 2011-04-22 NOTE — Telephone Encounter (Signed)
PT IS ON VACATION AND NEEDS LISINIPRIL CALLED IN.  PLEASE CALL PHAR THERE.  NUMBER LISTED.

## 2011-04-28 ENCOUNTER — Ambulatory Visit (INDEPENDENT_AMBULATORY_CARE_PROVIDER_SITE_OTHER): Payer: Medicare Other | Admitting: *Deleted

## 2011-04-28 ENCOUNTER — Other Ambulatory Visit: Payer: Self-pay | Admitting: Internal Medicine

## 2011-04-28 DIAGNOSIS — R001 Bradycardia, unspecified: Secondary | ICD-10-CM

## 2011-04-28 DIAGNOSIS — I498 Other specified cardiac arrhythmias: Secondary | ICD-10-CM

## 2011-04-29 LAB — REMOTE PACEMAKER DEVICE
AL IMPEDENCE PM: 468 Ohm
AL THRESHOLD: 0.375 V
ATRIAL PACING PM: 92
BAMS-0001: 175 {beats}/min
RV LEAD AMPLITUDE: 22.4 mv
RV LEAD IMPEDENCE PM: 621 Ohm
RV LEAD THRESHOLD: 0.5 V

## 2011-05-13 NOTE — Progress Notes (Signed)
Pacer checked by remote 

## 2011-05-26 ENCOUNTER — Encounter: Payer: Self-pay | Admitting: *Deleted

## 2011-05-27 ENCOUNTER — Telehealth: Payer: Self-pay

## 2011-05-27 ENCOUNTER — Telehealth: Payer: Self-pay | Admitting: *Deleted

## 2011-05-27 MED ORDER — MOMETASONE FUROATE 0.1 % EX OINT: TOPICAL_OINTMENT | CUTANEOUS | Status: DC

## 2011-05-27 NOTE — Telephone Encounter (Signed)
Ok to give wife whatever script husband was given (this way they don't feel one has a better treatment than the other)

## 2011-05-27 NOTE — Telephone Encounter (Signed)
Patient with poison Amy Dominguez (concerned because it is near eye), patient would like a rx called in. FYI: (Dr.Hopper approved rx for husband, when I called and informed husband rx was sent in he then mentioned his wife's concern with poison ivy as well)  [facility] [address]

## 2011-05-27 NOTE — Telephone Encounter (Signed)
She said she sent in reading and didn't hear anything back it was 2 months ago

## 2011-05-27 NOTE — Telephone Encounter (Signed)
Patient's husband aware rx called in

## 2011-05-30 NOTE — Telephone Encounter (Signed)
Lm for pt to call for next appt. dates.

## 2011-05-31 NOTE — Telephone Encounter (Signed)
Pt waiting to be called for ov with SK.

## 2011-06-20 ENCOUNTER — Other Ambulatory Visit: Payer: Self-pay | Admitting: Obstetrics & Gynecology

## 2011-06-20 DIAGNOSIS — Z1231 Encounter for screening mammogram for malignant neoplasm of breast: Secondary | ICD-10-CM

## 2011-07-01 ENCOUNTER — Ambulatory Visit: Payer: Medicare Other | Admitting: Family Medicine

## 2011-07-01 ENCOUNTER — Ambulatory Visit (INDEPENDENT_AMBULATORY_CARE_PROVIDER_SITE_OTHER): Payer: Medicare Other | Admitting: Family Medicine

## 2011-07-01 ENCOUNTER — Encounter: Payer: Self-pay | Admitting: Family Medicine

## 2011-07-01 VITALS — BP 138/86 | HR 92 | Temp 98.7°F | Wt 191.4 lb

## 2011-07-01 DIAGNOSIS — R0989 Other specified symptoms and signs involving the circulatory and respiratory systems: Secondary | ICD-10-CM

## 2011-07-01 DIAGNOSIS — R0609 Other forms of dyspnea: Secondary | ICD-10-CM

## 2011-07-01 DIAGNOSIS — J40 Bronchitis, not specified as acute or chronic: Secondary | ICD-10-CM

## 2011-07-01 MED ORDER — CLARITHROMYCIN ER 500 MG PO TB24: 1000.0000 mg | ORAL_TABLET | Freq: Every day | ORAL | Status: AC

## 2011-07-01 MED ORDER — CLARITHROMYCIN ER 500 MG PO TB24: 1000.0000 mg | ORAL_TABLET | Freq: Every day | ORAL | Status: DC

## 2011-07-01 MED ORDER — FLUTICASONE-SALMETEROL 250-50 MCG/DOSE IN AEPB: 1.0000 | INHALATION_SPRAY | Freq: Two times a day (BID) | RESPIRATORY_TRACT | Status: DC

## 2011-07-01 MED ORDER — ALBUTEROL SULFATE (5 MG/ML) 0.5% IN NEBU
2.5000 mg | INHALATION_SOLUTION | Freq: Once | RESPIRATORY_TRACT | Status: AC
Start: 2011-07-01 — End: 2011-07-01
  Administered 2011-07-01: 2.5 mg via RESPIRATORY_TRACT

## 2011-07-01 MED ORDER — ALBUTEROL SULFATE HFA 108 (90 BASE) MCG/ACT IN AERS: 2.0000 | INHALATION_SPRAY | Freq: Four times a day (QID) | RESPIRATORY_TRACT | Status: DC | PRN

## 2011-07-01 NOTE — Patient Instructions (Signed)

## 2011-07-01 NOTE — Progress Notes (Signed)
  Subjective:     Amy Dominguez is a 75 y.o. female here for evaluation of a cough. Onset of symptoms was 2 weeks ago. Symptoms have been gradually worsening since that time. The cough is productive of green sputum and is aggravated by exercise and reclining position. Associated symptoms include: fever, shortness of breath, sputum production and wheezing. Patient does not have a history of asthma. Patient does have a history of environmental allergens. Patient has not traveled recently. Patient does have a history of smoking. Patient has not had a previous chest x-ray. Patient has not had a PPD done.  The following portions of the patient's history were reviewed and updated as appropriate: allergies, current medications, past family history, past medical history, past social history, past surgical history and problem list.  Review of Systems Pertinent items are noted in HPI.    Objective:    Oxygen saturation 95% on room air BP 138/86  Pulse 92  Temp(Src) 98.7 F (37.1 C) (Oral)  Wt 191 lb 6.4 oz (86.818 kg)  SpO2 95% General appearance: alert, cooperative, appears stated age and no distress Ears: normal TM's and external ear canals both ears Nose: green discharge, mild congestion, sinus tenderness bilateral Throat: lips, mucosa, and tongue normal; teeth and gums normal Neck: no adenopathy, no carotid bruit, no JVD, supple, symmetrical, trachea midline and thyroid not enlarged, symmetric, no tenderness/mass/nodules Lungs: wheezes bilaterally Heart: regular rate and rhythm, S1, S2 normal, no murmur, click, rub or gallop Extremities: extremities normal, atraumatic, no cyanosis or edema    Assessment:    Acute Bronchitis    Plan:    Antibiotics per medication orders. Antitussives per medication orders. Avoid exposure to tobacco smoke and fumes. B-agonist inhaler. Call if shortness of breath worsens, blood in sputum, change in character of cough, development of fever or chills,  inability to maintain nutrition and hydration. Avoid exposure to tobacco smoke and fumes. Steroid inhaler as ordered.

## 2011-07-12 ENCOUNTER — Ambulatory Visit
Admission: RE | Admit: 2011-07-12 | Discharge: 2011-07-12 | Disposition: A | Discharge status: Home / Self Care | Payer: Medicare Other | Source: Ambulatory Visit | Attending: Obstetrics & Gynecology | Admitting: Obstetrics & Gynecology

## 2011-07-12 DIAGNOSIS — Z1231 Encounter for screening mammogram for malignant neoplasm of breast: Secondary | ICD-10-CM

## 2011-07-19 ENCOUNTER — Other Ambulatory Visit: Payer: Self-pay | Admitting: Neurological Surgery

## 2011-07-19 DIAGNOSIS — M4800 Spinal stenosis, site unspecified: Secondary | ICD-10-CM

## 2011-07-20 ENCOUNTER — Other Ambulatory Visit: Payer: Self-pay | Admitting: Neurological Surgery

## 2011-07-20 DIAGNOSIS — M4800 Spinal stenosis, site unspecified: Secondary | ICD-10-CM

## 2011-07-27 ENCOUNTER — Telehealth: Payer: Self-pay | Admitting: Internal Medicine

## 2011-07-27 NOTE — Telephone Encounter (Signed)
New problem;  Faxed referral was sent on 11/21. pls advise.

## 2011-07-27 NOTE — Telephone Encounter (Signed)
Left a message to call back.

## 2011-07-28 ENCOUNTER — Encounter: Payer: Self-pay | Admitting: Physician Assistant

## 2011-07-28 ENCOUNTER — Ambulatory Visit (INDEPENDENT_AMBULATORY_CARE_PROVIDER_SITE_OTHER): Payer: Medicare Other | Admitting: Physician Assistant

## 2011-07-28 ENCOUNTER — Other Ambulatory Visit: Payer: Self-pay

## 2011-07-28 ENCOUNTER — Encounter: Payer: Medicare Other | Admitting: *Deleted

## 2011-07-28 ENCOUNTER — Ambulatory Visit (INDEPENDENT_AMBULATORY_CARE_PROVIDER_SITE_OTHER): Payer: Medicare Other | Admitting: *Deleted

## 2011-07-28 ENCOUNTER — Encounter: Payer: Self-pay | Admitting: Internal Medicine

## 2011-07-28 VITALS — BP 136/68 | HR 87 | Ht 63.0 in | Wt 191.8 lb

## 2011-07-28 DIAGNOSIS — I059 Rheumatic mitral valve disease, unspecified: Secondary | ICD-10-CM

## 2011-07-28 DIAGNOSIS — I442 Atrioventricular block, complete: Secondary | ICD-10-CM

## 2011-07-28 DIAGNOSIS — I340 Nonrheumatic mitral (valve) insufficiency: Secondary | ICD-10-CM

## 2011-07-28 LAB — PACEMAKER DEVICE OBSERVATION
AL THRESHOLD: 0.375 V
RV LEAD THRESHOLD: 0.5 V

## 2011-07-28 MED ORDER — DABIGATRAN ETEXILATE MESYLATE 150 MG PO CAPS: 150.0000 mg | ORAL_CAPSULE | Freq: Two times a day (BID) | ORAL | Status: DC

## 2011-07-28 MED ORDER — POTASSIUM CHLORIDE CRYS ER 20 MEQ PO TBCR: 20.0000 meq | EXTENDED_RELEASE_TABLET | Freq: Every day | ORAL | Status: DC

## 2011-07-28 MED ORDER — TORSEMIDE 20 MG PO TABS: 20.0000 mg | ORAL_TABLET | Freq: Every day | ORAL | Status: DC

## 2011-07-28 MED ORDER — LISINOPRIL 20 MG PO TABS: 20.0000 mg | ORAL_TABLET | Freq: Every day | ORAL | Status: DC

## 2011-07-28 NOTE — Assessment & Plan Note (Signed)
Patient has paroxysmal atrial flutter. She is stopping her productive for 5 days in order to have a myelogram. Patient is state that she hears her atrial flutter.

## 2011-07-28 NOTE — Telephone Encounter (Signed)
F/U   Need clearance regarding praxada. Fax # [phone].

## 2011-07-28 NOTE — Telephone Encounter (Signed)
LM for CB on machine.

## 2011-07-28 NOTE — Telephone Encounter (Signed)
Will fax office visit note to [facility].

## 2011-07-28 NOTE — Telephone Encounter (Signed)
pradaxa

## 2011-07-28 NOTE — Assessment & Plan Note (Signed)
Patient here for pacer check. She will go to the pacer clinic for that.

## 2011-07-28 NOTE — Telephone Encounter (Signed)
Left message at number listed which is for [facility].  Requested they call back with information related to what procedure the pt is having, when she is having it and how long they want for her to be off Pradaxa.

## 2011-07-28 NOTE — Assessment & Plan Note (Signed)
For myelogram Monday. Pradaxa on hold for 5 days. Patient may require surgery.

## 2011-07-28 NOTE — Patient Instructions (Addendum)
Your physician has requested that you have an echocardiogram. Echocardiography is a painless test that uses sound waves to create images of your heart. It provides your doctor with information about the size and shape of your heart and how well your heart's chambers and valves are working. This procedure takes approximately one hour. There are no restrictions for this procedure.   Your physician recommends that you continue on your current medications as directed. Please refer to the Current Medication list given to you today.  Your physician recommends that you schedule a follow-up appointment in:  1 month with Dr. Graciela Husbands.

## 2011-07-28 NOTE — Telephone Encounter (Signed)
Per office visit note by Herma Carson, PA OK for pt to hold Plavix for myelogram.

## 2011-07-28 NOTE — Assessment & Plan Note (Signed)
Patient has history of mild aortic stenosis on echo in 2009. She does have a harsh systolic murmur. We will order an echo to follow up her aortic stenosis. She may need surgery within the next month for her back.

## 2011-07-28 NOTE — Progress Notes (Signed)
HPI:  This is a 75 year old white female patient of Dr. Graciela Husbands who is here for routine pacer check. The patient has had no symptoms and has been doing well. She does state that she can hear her heartbeat at different times and can tell when she is in atrial fib her in sinus rhythm.  The patient is scheduled for myelogram next Monday because of spinal stenosis and chronic back problems. She has to be off her Pradaxa  for 5 days.   Allergies  Allergen Reactions  . Acetaminophen     REACTION: BRAND  NAME CAUSES MIGRAINE    Current Outpatient Prescriptions on File Prior to Visit  Medication Sig Dispense Refill  . Calcium-Vitamin D (CALTRATE 600 PLUS-VIT D PO) Take by mouth daily.        . cetirizine (ZYRTEC) 10 MG tablet Take 10 mg by mouth daily.        . dabigatran (PRADAXA) 150 MG CAPS Take 1 capsule (150 mg total) by mouth every 12 (twelve) hours.  60 capsule  6  . fluticasone (FLONASE) 50 MCG/ACT nasal spray 1 spray by Nasal route daily.  16 g  2  . lisinopril (PRINIVIL,ZESTRIL) 20 MG tablet Take 1 tablet (20 mg total) by mouth daily.  30 tablet  3  . mometasone (ELOCON) 0.1 % ointment Two times daily as needed  45 g  0  . Nutritional Supplements (ESTROVEN PO) Take by mouth. 2 by mouth daily       . potassium chloride SA (K-DUR,KLOR-CON) 20 MEQ tablet Take 1 tablet (20 mEq total) by mouth daily.  30 tablet  9  . S-Adenosylmethionine (SAM-E) 400 MG TABS Take by mouth daily.        Marland Kitchen torsemide (DEMADEX) 20 MG tablet Take 20 mg by mouth daily.          Past Medical History  Diagnosis Date  . Arthralgia   . Spinal stenosis     medtronic adapta; spinal stenosis LS spine    Past Surgical History  Procedure Date  . Polypectomy   . Hemorrhoid surgery   . Tonsillectomy   . Tubal ligation     Family History  Problem Relation Age of Onset  . Stroke Mother   . Heart attack Father 77  . Breast cancer Sister   . Deep vein thrombosis Daughter     History   Social History  .  Marital Status: Married    Spouse Name: N/A    Number of Children: N/A  . Years of Education: N/A   Occupational History  . Retired Charity fundraiser    Social History Main Topics  . Smoking status: Current Everyday Smoker    Types: Cigarettes  . Smokeless tobacco: Not on file  . Alcohol Use: Yes     socially  . Drug Use: No  . Sexually Active: Not on file   Other Topics Concern  . Not on file   Social History Narrative   No added salt    ROS: See HPI Eyes: Negative Ears:Negative for hearing loss, tinnitus Cardiovascular: Negative for chest pain, palpitations,irregular heartbeat, dyspnea, dyspnea on exertion, near-syncope, orthopnea, paroxysmal nocturnal dyspnia and syncope,edema, claudication, cyanosis,.  Respiratory:   Negative for cough, hemoptysis, shortness of breath, sleep disturbances due to breathing, sputum production and wheezing.   Endocrine: Negative for cold intolerance and heat intolerance.  Hematologic/Lymphatic: Negative for adenopathy and bleeding problem. Does not bruise/bleed easily.  Musculoskeletal: L4-L5 spinal stenosis and upper back pain patient scheduled for myelogram  Monday because an MRI can't be done because of pacemaker.   Gastrointestinal: Negative for nausea, vomiting, reflux, abdominal pain, diarrhea, constipation.   Neurological: Negative.  Allergic/Immunologic: Negative for environmental allergies.   PHYSICAL EXAM: Well-nournished, in no acute distress. Neck: bilateral bruits versus murmur portrayed,No JVD, HJR,or thyroid enlargement Lungs: No tachypnea, clear without wheezing, rales, or rhonchi Cardiovascular: RRR, PMI not displaced, 2/6 harsh systolic murmur at the right sternal border and left sternal border, no gallops, bruit, thrill, or heave. Abdomen: BS normal. Soft without organomegaly, masses, lesions or tenderness. Extremities: without cyanosis, clubbing or edema. Good distal pulses bilateral SKin: Warm, no lesions or rashes  Musculoskeletal:  No deformities Neuro: no focal signs  BP 136/68  Pulse 87  Ht 5\' 3"  (1.6 m)  Wt 191 lb 12.8 oz (87 kg)  BMI 33.98 kg/m2

## 2011-07-28 NOTE — Telephone Encounter (Signed)
Pt needs to have lumbar and thoracic myogram.  Will need to come off Plavix for 48 hours before and can restart same day.  Wants to have it Monday.  Need approval to hold Plavix.

## 2011-07-28 NOTE — Progress Notes (Signed)
PPM check 

## 2011-07-29 ENCOUNTER — Encounter: Payer: Medicare Other | Admitting: Internal Medicine

## 2011-08-01 ENCOUNTER — Ambulatory Visit
Admission: RE | Admit: 2011-08-01 | Discharge: 2011-08-01 | Disposition: A | Discharge status: Home / Self Care | Payer: Medicare Other | Source: Ambulatory Visit | Attending: Neurological Surgery | Admitting: Neurological Surgery

## 2011-08-01 ENCOUNTER — Other Ambulatory Visit: Payer: Self-pay | Admitting: Neurological Surgery

## 2011-08-01 DIAGNOSIS — M4800 Spinal stenosis, site unspecified: Secondary | ICD-10-CM

## 2011-08-01 MED ORDER — DIAZEPAM 5 MG PO TABS
5.0000 mg | ORAL_TABLET | Freq: Once | ORAL | Status: AC
  Administered 2011-08-01: 5 mg via ORAL

## 2011-08-01 MED ORDER — IOHEXOL 300 MG/ML  SOLN
10.0000 mL | Freq: Once | INTRAMUSCULAR | Status: AC | PRN
  Administered 2011-08-01: 10 mL via INTRATHECAL

## 2011-08-01 NOTE — Patient Instructions (Signed)
Myelogram  Discharge Instructions  1. Go home and rest quietly for the next 24 hours.  It is important to lie flat for the next 24 hours.  Get up only to go to the restroom.  You may lie in the bed or on a couch on your back, your stomach, your left side or your right side.  You may have one pillow under your head.  You may have pillows between your knees while you are on your side or under your knees while you are on your back.  2. DO NOT drive today.  Recline the seat as far back as it will go, while still wearing your seat belt, on the way home.  3. You may get up to go to the bathroom as needed.  You may sit up for 10 minutes to eat.  You may resume your normal diet and medications unless otherwise indicated.  4. The incidence of headache, nausea, or vomiting is about 5% (one in 20 patients).  If you develop a headache, lie flat and drink plenty of fluids until the headache goes away.  Caffeinated beverages may be helpful.  If you develop severe nausea and vomiting or a headache that does not go away with flat bed rest, call [phone].  5. You may resume normal activities after your 24 hours of bed rest is over; however, do not exert yourself strongly or do any heavy lifting tomorrow.  6. Call your physician for a follow-up appointment.  The results of your myelogram will be sent directly to your physician by the following day.  7. If you have any questions or if complications develop after you arrive home, please call [phone].  Discharge instructions have been explained to the patient.  The patient, or the person responsible for the patient, fully understands these instructions.   Resume pradaxa today. 

## 2011-08-05 ENCOUNTER — Encounter: Payer: Self-pay | Admitting: *Deleted

## 2011-08-08 ENCOUNTER — Ambulatory Visit (HOSPITAL_COMMUNITY): Payer: Medicare Other | Attending: Internal Medicine | Admitting: Radiology

## 2011-08-08 DIAGNOSIS — I4892 Unspecified atrial flutter: Secondary | ICD-10-CM

## 2011-08-08 DIAGNOSIS — I4891 Unspecified atrial fibrillation: Secondary | ICD-10-CM | POA: Insufficient documentation

## 2011-08-08 DIAGNOSIS — J4489 Other specified chronic obstructive pulmonary disease: Secondary | ICD-10-CM | POA: Insufficient documentation

## 2011-08-08 DIAGNOSIS — J449 Chronic obstructive pulmonary disease, unspecified: Secondary | ICD-10-CM | POA: Insufficient documentation

## 2011-08-08 DIAGNOSIS — I059 Rheumatic mitral valve disease, unspecified: Secondary | ICD-10-CM | POA: Insufficient documentation

## 2011-08-08 DIAGNOSIS — I340 Nonrheumatic mitral (valve) insufficiency: Secondary | ICD-10-CM

## 2011-08-08 DIAGNOSIS — I079 Rheumatic tricuspid valve disease, unspecified: Secondary | ICD-10-CM | POA: Insufficient documentation

## 2011-08-08 DIAGNOSIS — Z950 Presence of cardiac pacemaker: Secondary | ICD-10-CM | POA: Insufficient documentation

## 2011-08-08 DIAGNOSIS — E785 Hyperlipidemia, unspecified: Secondary | ICD-10-CM | POA: Insufficient documentation

## 2011-08-15 ENCOUNTER — Telehealth: Payer: Self-pay | Admitting: Internal Medicine

## 2011-08-15 DIAGNOSIS — I4892 Unspecified atrial flutter: Secondary | ICD-10-CM

## 2011-08-15 MED ORDER — DABIGATRAN ETEXILATE MESYLATE 150 MG PO CAPS: 150.0000 mg | ORAL_CAPSULE | Freq: Two times a day (BID) | ORAL | Status: DC

## 2011-08-15 NOTE — Telephone Encounter (Signed)
The patient is aware her RX is at the front desk for pick up.

## 2011-08-15 NOTE — Telephone Encounter (Signed)
New msg Pt said she wants written rx for pradaxa. Please call her back and let her know when done.

## 2011-08-15 NOTE — Telephone Encounter (Signed)
I spoke with the patient. She needs a prescription for #30 days worth. I explained I would call her back when this is ready. She wants to come to the office to pick this up.

## 2011-08-22 ENCOUNTER — Encounter: Payer: Self-pay | Admitting: Internal Medicine

## 2011-08-26 ENCOUNTER — Telehealth: Payer: Self-pay | Admitting: *Deleted

## 2011-08-26 NOTE — Telephone Encounter (Signed)
[place]. She states that she went to have her [facility] filled at [facility]. She regularly pays $35 for her prescription. Per the [facility], they state that she owes $132. She wanted me to find out what is going on with this. I explained to her that I am not certain. I will [facility] on [address] and speak with them to see how she is usually charged, then I will call the patient back. The number for [facility] [place] [facility] is [phone]. Sherri Rad, RN, BSN   [facility] [address] The [facility] states that she paid $132.27 when she was there on 11/19. She may have reached the donut hole on her insurance. I have called the patient back and made her aware of this. She is agreeable and realized that she probably has hit the donut hole. She was grateful for the call. Sherri Rad, RN, BSN

## 2011-09-06 ENCOUNTER — Encounter: Payer: Self-pay | Admitting: Internal Medicine

## 2011-09-06 ENCOUNTER — Telehealth: Payer: Self-pay | Admitting: Internal Medicine

## 2011-09-06 ENCOUNTER — Encounter (HOSPITAL_COMMUNITY): Payer: Self-pay

## 2011-09-06 NOTE — Telephone Encounter (Signed)
I spoke with the patient. I made her aware that I had reviewed her chart with Dr. Graciela Husbands, and he does not need to see her. She has been r/s to February when her regular follow up is due. I also advised her that Dr. Graciela Husbands is sending a clearance to the surgeon for her back surgery scheduled for 09/21/11.

## 2011-09-06 NOTE — Telephone Encounter (Signed)
Pt was in a couple weeks ago and wants to know if she still needs appt 1-16 with [facility]

## 2011-09-09 ENCOUNTER — Ambulatory Visit: Payer: Medicare Other | Admitting: Internal Medicine

## 2011-09-13 ENCOUNTER — Other Ambulatory Visit (HOSPITAL_COMMUNITY): Payer: Self-pay | Admitting: *Deleted

## 2011-09-14 ENCOUNTER — Encounter (HOSPITAL_COMMUNITY)
Admission: RE | Admit: 2011-09-14 | Discharge: 2011-09-14 | Disposition: A | Discharge status: Home / Self Care | Payer: Medicare Other | Source: Ambulatory Visit | Attending: Neurological Surgery | Admitting: Neurological Surgery

## 2011-09-14 ENCOUNTER — Ambulatory Visit (HOSPITAL_COMMUNITY)
Admission: RE | Admit: 2011-09-14 | Discharge: 2011-09-14 | Disposition: A | Discharge status: Home / Self Care | Payer: Medicare Other | Source: Ambulatory Visit | Attending: Neurological Surgery | Admitting: Neurological Surgery

## 2011-09-14 ENCOUNTER — Encounter (HOSPITAL_COMMUNITY): Payer: Self-pay

## 2011-09-14 ENCOUNTER — Encounter: Payer: Medicare Other | Admitting: Internal Medicine

## 2011-09-14 DIAGNOSIS — Z950 Presence of cardiac pacemaker: Secondary | ICD-10-CM | POA: Insufficient documentation

## 2011-09-14 DIAGNOSIS — Z01812 Encounter for preprocedural laboratory examination: Secondary | ICD-10-CM | POA: Insufficient documentation

## 2011-09-14 DIAGNOSIS — Z01818 Encounter for other preprocedural examination: Secondary | ICD-10-CM | POA: Insufficient documentation

## 2011-09-14 HISTORY — DX: Unspecified osteoarthritis, unspecified site: M19.90

## 2011-09-14 HISTORY — DX: Cardiac arrhythmia, unspecified: I49.9

## 2011-09-14 HISTORY — DX: Essential (primary) hypertension: I10

## 2011-09-14 LAB — DIFFERENTIAL
Basophils Relative: 1 % (ref 0–1)
Eosinophils Absolute: 0.1 10*3/uL (ref 0.0–0.7)
Eosinophils Relative: 2 % (ref 0–5)
Monocytes Absolute: 1 10*3/uL (ref 0.1–1.0)
Monocytes Relative: 11 % (ref 3–12)

## 2011-09-14 LAB — BASIC METABOLIC PANEL
BUN: 18 mg/dL (ref 6–23)
Chloride: 103 mEq/L (ref 96–112)
GFR calc non Af Amer: 66 mL/min — ABNORMAL LOW (ref >90)
Glucose, Bld: 104 mg/dL — ABNORMAL HIGH (ref 70–99)
Potassium: 4.1 mEq/L (ref 3.5–5.1)

## 2011-09-14 LAB — CBC
HCT: 39.5 % (ref 36.0–46.0)
Hemoglobin: 13.2 g/dL (ref 12.0–15.0)
MCH: 29 pg (ref 26.0–34.0)
MCHC: 33.4 g/dL (ref 30.0–36.0)

## 2011-09-14 NOTE — Progress Notes (Signed)
SPOKE WITH CAROLYN AT [facility] REGARDING EKG, SHE STATED PATIENT HAS NOT HAD ONE IN OVER A YEAR.

## 2011-09-14 NOTE — Progress Notes (Signed)
REQUESTED EKG FROM [facility] ([place]), THEY WILL FAX.

## 2011-09-15 ENCOUNTER — Other Ambulatory Visit (HOSPITAL_COMMUNITY): Payer: Self-pay

## 2011-09-15 NOTE — Consult Note (Signed)
Anesthesia:  Patient is a 76 year old female scheduled for a decompression laminectomy L4-5 on 09/21/11.  Her history includes sinus node dysfunction s/p Medtronic PPM (Dr. Graciela Husbands), mild AS according to Dr. Odessa Fleming last note (but not noted on her last echo), HTN, chronic SOB, smoking, asthma, arthralgias, and spinal stenosis.  According to a telephone encounter by Sherri Rad, RN, Dr. Graciela Husbands has cleared her for this procedure (See Notes tab, 09/06/11 telephone encounter and under Media tab)  Her last EKG was > 1 year ago, so she is going to have one done on arrival.    Her echo on 08/08/11 showed:  Left ventricle: The cavity size was normal. Wall thickness was increased in a pattern of mild LVH. Systolic function was vigorous. The estimated ejection fraction was in the range of 65% to 70%. Doppler parameters are consistent with abnormal left ventricular relaxation (grade 1 diastolic Dysfunction). Trivial MR, mild TR.  No AS.  CXR shows no active cardiopulmonary disease.  Labs reviewed.  Her PT is mildly elevated at 15.4, but INR is normal at 1.19.  Her PTT is also mildly elevated at 44.  PLT count is WNL.  Hepatic function testing was not done, but was WNL last year.  She takes Pradaxa, and has instructions to stop 5 days prior to surgery.  Will go ahead and repeat coags and get HFP when she arrives, but suspect she will be able to proceed unless labs are significantly elevated.

## 2011-09-20 MED ORDER — CEFAZOLIN SODIUM-DEXTROSE 2-3 GM-% IV SOLR
2.0000 g | INTRAVENOUS | Status: AC
  Administered 2011-09-21: 2 g via INTRAVENOUS
  Filled 2011-09-20: qty 50

## 2011-09-20 MED ORDER — CEFAZOLIN SODIUM 1-5 GM-% IV SOLN: 1.0000 g | INTRAVENOUS | Status: DC

## 2011-09-21 ENCOUNTER — Encounter (HOSPITAL_COMMUNITY): Payer: Self-pay | Admitting: Neurological Surgery

## 2011-09-21 ENCOUNTER — Encounter (HOSPITAL_COMMUNITY)
Admission: RE | Disposition: A | Discharge status: Home / Self Care | Payer: Self-pay | Source: Ambulatory Visit | Attending: Neurological Surgery

## 2011-09-21 ENCOUNTER — Ambulatory Visit (HOSPITAL_COMMUNITY): Payer: Medicare Other

## 2011-09-21 ENCOUNTER — Encounter (HOSPITAL_COMMUNITY): Payer: Self-pay | Admitting: Vascular Surgery

## 2011-09-21 ENCOUNTER — Inpatient Hospital Stay (HOSPITAL_COMMUNITY)
Admission: RE | Admit: 2011-09-21 | Discharge: 2011-09-22 | DRG: 491 | Disposition: A | Discharge status: Home / Self Care | Payer: Medicare Other | Source: Ambulatory Visit | Attending: Neurological Surgery | Admitting: Neurological Surgery

## 2011-09-21 ENCOUNTER — Ambulatory Visit (HOSPITAL_COMMUNITY): Admission: RE | Admit: 2011-09-21 | Payer: Medicare Other | Source: Ambulatory Visit | Admitting: Neurological Surgery

## 2011-09-21 ENCOUNTER — Other Ambulatory Visit: Payer: Self-pay

## 2011-09-21 ENCOUNTER — Ambulatory Visit (HOSPITAL_COMMUNITY): Payer: Medicare Other | Admitting: Vascular Surgery

## 2011-09-21 DIAGNOSIS — I10 Essential (primary) hypertension: Secondary | ICD-10-CM | POA: Diagnosis present

## 2011-09-21 DIAGNOSIS — Z96698 Presence of other orthopedic joint implants: Secondary | ICD-10-CM

## 2011-09-21 DIAGNOSIS — Z85820 Personal history of malignant melanoma of skin: Secondary | ICD-10-CM

## 2011-09-21 DIAGNOSIS — M48062 Spinal stenosis, lumbar region with neurogenic claudication: Principal | ICD-10-CM | POA: Diagnosis present

## 2011-09-21 DIAGNOSIS — R51 Headache: Secondary | ICD-10-CM | POA: Diagnosis present

## 2011-09-21 DIAGNOSIS — F17200 Nicotine dependence, unspecified, uncomplicated: Secondary | ICD-10-CM | POA: Diagnosis present

## 2011-09-21 DIAGNOSIS — J45909 Unspecified asthma, uncomplicated: Secondary | ICD-10-CM | POA: Diagnosis present

## 2011-09-21 DIAGNOSIS — Z886 Allergy status to analgesic agent status: Secondary | ICD-10-CM

## 2011-09-21 DIAGNOSIS — Z9089 Acquired absence of other organs: Secondary | ICD-10-CM

## 2011-09-21 DIAGNOSIS — E039 Hypothyroidism, unspecified: Secondary | ICD-10-CM | POA: Diagnosis present

## 2011-09-21 DIAGNOSIS — Z860100 Personal history of colon polyps, unspecified: Secondary | ICD-10-CM

## 2011-09-21 DIAGNOSIS — Z950 Presence of cardiac pacemaker: Secondary | ICD-10-CM

## 2011-09-21 DIAGNOSIS — Z79899 Other long term (current) drug therapy: Secondary | ICD-10-CM

## 2011-09-21 HISTORY — PX: LUMBAR LAMINECTOMY/DECOMPRESSION MICRODISCECTOMY: SHX5026

## 2011-09-21 LAB — PROTIME-INR: INR: 1.01 (ref 0.00–1.49)

## 2011-09-21 LAB — HEPATIC FUNCTION PANEL
AST: 17 U/L (ref 0–37)
Albumin: 3.6 g/dL (ref 3.5–5.2)
Bilirubin, Direct: <0.1 mg/dL (ref 0.0–0.3)

## 2011-09-21 LAB — APTT: aPTT: 29 seconds (ref 24–37)

## 2011-09-21 SURGERY — LUMBAR LAMINECTOMY/DECOMPRESSION MICRODISCECTOMY
Anesthesia: General | Site: Back

## 2011-09-21 MED ORDER — SODIUM CHLORIDE 0.9 % IJ SOLN
3.0000 mL | Freq: Two times a day (BID) | INTRAMUSCULAR | Status: DC
  Administered 2011-09-21 (×2): 3 mL via INTRAVENOUS

## 2011-09-21 MED ORDER — 0.9 % SODIUM CHLORIDE (POUR BTL) OPTIME
TOPICAL | Status: DC | PRN
  Administered 2011-09-21: 1000 mL

## 2011-09-21 MED ORDER — MENTHOL 3 MG MT LOZG: 1.0000 | LOZENGE | OROMUCOSAL | Status: DC | PRN

## 2011-09-21 MED ORDER — HYDROMORPHONE HCL PF 1 MG/ML IJ SOLN
INTRAMUSCULAR | Status: AC
  Administered 2011-09-21: 0.5 mg via INTRAVENOUS
  Filled 2011-09-21: qty 1

## 2011-09-21 MED ORDER — HYDROMORPHONE HCL PF 1 MG/ML IJ SOLN
0.2500 mg | INTRAMUSCULAR | Status: DC | PRN
  Administered 2011-09-21 (×2): 0.5 mg via INTRAVENOUS

## 2011-09-21 MED ORDER — PHENOL 1.4 % MT LIQD: 1.0000 | OROMUCOSAL | Status: DC | PRN

## 2011-09-21 MED ORDER — MIDAZOLAM HCL 5 MG/5ML IJ SOLN
INTRAMUSCULAR | Status: DC | PRN
  Administered 2011-09-21: 2 mg via INTRAVENOUS

## 2011-09-21 MED ORDER — BACITRACIN 50000 UNITS IM SOLR
INTRAMUSCULAR | Status: AC
  Filled 2011-09-21: qty 1

## 2011-09-21 MED ORDER — GLYCOPYRROLATE 0.2 MG/ML IJ SOLN
INTRAMUSCULAR | Status: DC | PRN
  Administered 2011-09-21: .6 mg via INTRAVENOUS

## 2011-09-21 MED ORDER — HYDROCODONE-ACETAMINOPHEN 5-325 MG PO TABS: 1.0000 | ORAL_TABLET | ORAL | Status: DC | PRN

## 2011-09-21 MED ORDER — METHOCARBAMOL 500 MG PO TABS
500.0000 mg | ORAL_TABLET | Freq: Three times a day (TID) | ORAL | Status: DC | PRN
  Administered 2011-09-21 – 2011-09-22 (×2): 500 mg via ORAL
  Filled 2011-09-21 (×2): qty 1

## 2011-09-21 MED ORDER — SENNA 8.6 MG PO TABS
1.0000 | ORAL_TABLET | Freq: Two times a day (BID) | ORAL | Status: DC
  Filled 2011-09-21 (×3): qty 1

## 2011-09-21 MED ORDER — ACETAMINOPHEN 325 MG PO TABS: 650.0000 mg | ORAL_TABLET | ORAL | Status: DC | PRN

## 2011-09-21 MED ORDER — CEFAZOLIN SODIUM 1-5 GM-% IV SOLN
1.0000 g | Freq: Three times a day (TID) | INTRAVENOUS | Status: AC
  Administered 2011-09-21 (×2): 1 g via INTRAVENOUS
  Filled 2011-09-21 (×2): qty 50

## 2011-09-21 MED ORDER — BUPIVACAINE HCL (PF) 0.25 % IJ SOLN
INTRAMUSCULAR | Status: DC | PRN
  Administered 2011-09-21: 3 mL

## 2011-09-21 MED ORDER — SODIUM CHLORIDE 0.9 % IR SOLN
Status: DC | PRN
  Administered 2011-09-21: 10:00:00

## 2011-09-21 MED ORDER — FENTANYL CITRATE 0.05 MG/ML IJ SOLN
INTRAMUSCULAR | Status: DC | PRN
  Administered 2011-09-21 (×2): 100 ug via INTRAVENOUS
  Administered 2011-09-21: 50 ug via INTRAVENOUS

## 2011-09-21 MED ORDER — HYDROMORPHONE HCL PF 1 MG/ML IJ SOLN: 0.5000 mg | INTRAMUSCULAR | Status: DC | PRN

## 2011-09-21 MED ORDER — POTASSIUM CHLORIDE CRYS ER 20 MEQ PO TBCR
20.0000 meq | EXTENDED_RELEASE_TABLET | Freq: Every day | ORAL | Status: DC
Start: 2011-09-21 — End: 2011-09-22
  Filled 2011-09-21 (×2): qty 1

## 2011-09-21 MED ORDER — HEMOSTATIC AGENTS (NO CHARGE) OPTIME
TOPICAL | Status: DC | PRN
  Administered 2011-09-21: 1 via TOPICAL

## 2011-09-21 MED ORDER — THROMBIN 5000 UNITS EX KIT
PACK | CUTANEOUS | Status: DC | PRN
  Administered 2011-09-21: 5000 [IU] via TOPICAL

## 2011-09-21 MED ORDER — ONDANSETRON HCL 4 MG/2ML IJ SOLN
INTRAMUSCULAR | Status: DC | PRN
  Administered 2011-09-21: 4 mg via INTRAVENOUS

## 2011-09-21 MED ORDER — ROCURONIUM BROMIDE 100 MG/10ML IV SOLN
INTRAVENOUS | Status: DC | PRN
  Administered 2011-09-21: 50 mg via INTRAVENOUS

## 2011-09-21 MED ORDER — POTASSIUM CHLORIDE IN NACL 20-0.9 MEQ/L-% IV SOLN
INTRAVENOUS | Status: DC
  Filled 2011-09-21 (×3): qty 1000

## 2011-09-21 MED ORDER — SODIUM CHLORIDE 0.9 % IV SOLN
INTRAVENOUS | Status: AC
  Filled 2011-09-21: qty 500

## 2011-09-21 MED ORDER — LORATADINE 10 MG PO TABS
10.0000 mg | ORAL_TABLET | Freq: Every day | ORAL | Status: DC
  Filled 2011-09-21 (×2): qty 1

## 2011-09-21 MED ORDER — LISINOPRIL 20 MG PO TABS
20.0000 mg | ORAL_TABLET | Freq: Every day | ORAL | Status: DC
  Administered 2011-09-21: 20 mg via ORAL
  Filled 2011-09-21 (×2): qty 1

## 2011-09-21 MED ORDER — ACETAMINOPHEN 650 MG RE SUPP: 650.0000 mg | RECTAL | Status: DC | PRN

## 2011-09-21 MED ORDER — TORSEMIDE 20 MG PO TABS
20.0000 mg | ORAL_TABLET | Freq: Every day | ORAL | Status: DC
  Filled 2011-09-21 (×2): qty 1

## 2011-09-21 MED ORDER — PROMETHAZINE HCL 25 MG/ML IJ SOLN: 6.2500 mg | INTRAMUSCULAR | Status: DC | PRN

## 2011-09-21 MED ORDER — OXYCODONE HCL 5 MG PO TABS
5.0000 mg | ORAL_TABLET | ORAL | Status: DC | PRN
  Administered 2011-09-21 – 2011-09-22 (×3): 5 mg via ORAL
  Filled 2011-09-21 (×3): qty 1

## 2011-09-21 MED ORDER — DEXAMETHASONE SODIUM PHOSPHATE 10 MG/ML IJ SOLN: 10.0000 mg | Freq: Once | INTRAMUSCULAR | Status: DC

## 2011-09-21 MED ORDER — LACTATED RINGERS IV SOLN
INTRAVENOUS | Status: DC | PRN
  Administered 2011-09-21 (×2): via INTRAVENOUS

## 2011-09-21 MED ORDER — FUROSEMIDE 20 MG PO TABS
20.0000 mg | ORAL_TABLET | Freq: Every day | ORAL | Status: DC
  Filled 2011-09-21 (×2): qty 1

## 2011-09-21 MED ORDER — DEXAMETHASONE SODIUM PHOSPHATE 10 MG/ML IJ SOLN
INTRAMUSCULAR | Status: DC | PRN
  Administered 2011-09-21: 10 mg via INTRAVENOUS

## 2011-09-21 MED ORDER — MEPERIDINE HCL 25 MG/ML IJ SOLN: 6.2500 mg | INTRAMUSCULAR | Status: DC | PRN

## 2011-09-21 MED ORDER — NEOSTIGMINE METHYLSULFATE 1 MG/ML IJ SOLN
INTRAMUSCULAR | Status: DC | PRN
  Administered 2011-09-21: 5 mg via INTRAVENOUS

## 2011-09-21 MED ORDER — ONDANSETRON HCL 4 MG/2ML IJ SOLN: 4.0000 mg | INTRAMUSCULAR | Status: DC | PRN

## 2011-09-21 MED ORDER — PROPOFOL 10 MG/ML IV BOLUS
INTRAVENOUS | Status: DC | PRN
  Administered 2011-09-21: 200 mg via INTRAVENOUS

## 2011-09-21 MED ORDER — DEXAMETHASONE SODIUM PHOSPHATE 10 MG/ML IJ SOLN
INTRAMUSCULAR | Status: AC
  Filled 2011-09-21: qty 1

## 2011-09-21 MED ORDER — SODIUM CHLORIDE 0.9 % IJ SOLN: 3.0000 mL | INTRAMUSCULAR | Status: DC | PRN

## 2011-09-21 MED ORDER — THROMBIN 5000 UNITS EX SOLR
OROMUCOSAL | Status: DC | PRN
  Administered 2011-09-21: 10:00:00 via TOPICAL

## 2011-09-21 SURGICAL SUPPLY — 52 items
APL SKNCLS STERI-STRIP NONHPOA (GAUZE/BANDAGES/DRESSINGS) ×1
BAG DECANTER FOR FLEXI CONT (MISCELLANEOUS) ×2 IMPLANT
BENZOIN TINCTURE PRP APPL 2/3 (GAUZE/BANDAGES/DRESSINGS) ×2 IMPLANT
BUR MATCHSTICK NEURO 3.0 LAGG (BURR) ×2 IMPLANT
CANISTER SUCTION 2500CC (MISCELLANEOUS) ×2 IMPLANT
CLOSURE STERI STRIP 1/2 X4 (GAUZE/BANDAGES/DRESSINGS) ×1 IMPLANT
CLOTH BEACON ORANGE TIMEOUT ST (SAFETY) ×2 IMPLANT
CONT SPEC 4OZ CLIKSEAL STRL BL (MISCELLANEOUS) ×2 IMPLANT
DRAPE LAPAROTOMY 100X72X124 (DRAPES) ×2 IMPLANT
DRAPE MICROSCOPE ZEISS OPMI (DRAPES) ×1 IMPLANT
DRAPE POUCH INSTRU U-SHP 10X18 (DRAPES) ×2 IMPLANT
DRAPE SURG 17X23 STRL (DRAPES) ×2 IMPLANT
DRESSING TELFA 8X3 (GAUZE/BANDAGES/DRESSINGS) ×2 IMPLANT
DRSG OPSITE 4X5.5 SM (GAUZE/BANDAGES/DRESSINGS) ×3 IMPLANT
DURAPREP 26ML APPLICATOR (WOUND CARE) ×2 IMPLANT
ELECT REM PT RETURN 9FT ADLT (ELECTROSURGICAL) ×2
ELECTRODE REM PT RTRN 9FT ADLT (ELECTROSURGICAL) ×1 IMPLANT
EVACUATOR 1/8 PVC DRAIN (DRAIN) ×1 IMPLANT
GAUZE SPONGE 4X4 16PLY XRAY LF (GAUZE/BANDAGES/DRESSINGS) IMPLANT
GLOVE BIO SURGEON STRL SZ8 (GLOVE) ×2 IMPLANT
GLOVE BIOGEL PI IND STRL 7.0 (GLOVE) IMPLANT
GLOVE BIOGEL PI IND STRL 7.5 (GLOVE) IMPLANT
GLOVE BIOGEL PI IND STRL 8 (GLOVE) IMPLANT
GLOVE BIOGEL PI INDICATOR 7.0 (GLOVE) ×2
GLOVE BIOGEL PI INDICATOR 7.5 (GLOVE)
GLOVE BIOGEL PI INDICATOR 8 (GLOVE) ×1
GLOVE ECLIPSE 7.5 STRL STRAW (GLOVE) ×1 IMPLANT
GLOVE SURG SS PI 6.5 STRL IVOR (GLOVE) ×2 IMPLANT
GOWN BRE IMP SLV AUR LG STRL (GOWN DISPOSABLE) ×2 IMPLANT
GOWN BRE IMP SLV AUR XL STRL (GOWN DISPOSABLE) ×3 IMPLANT
GOWN STRL REIN 2XL LVL4 (GOWN DISPOSABLE) IMPLANT
HEMOSTAT POWDER KIT SURGIFOAM (HEMOSTASIS) IMPLANT
KIT BASIN OR (CUSTOM PROCEDURE TRAY) ×2 IMPLANT
KIT ROOM TURNOVER OR (KITS) ×2 IMPLANT
NDL HYPO 25X1 1.5 SAFETY (NEEDLE) ×1 IMPLANT
NDL SPNL 20GX3.5 QUINCKE YW (NEEDLE) IMPLANT
NEEDLE HYPO 25X1 1.5 SAFETY (NEEDLE) ×2 IMPLANT
NEEDLE SPNL 20GX3.5 QUINCKE YW (NEEDLE) IMPLANT
NS IRRIG 1000ML POUR BTL (IV SOLUTION) ×2 IMPLANT
PACK LAMINECTOMY NEURO (CUSTOM PROCEDURE TRAY) ×2 IMPLANT
PAD ARMBOARD 7.5X6 YLW CONV (MISCELLANEOUS) ×6 IMPLANT
RUBBERBAND STERILE (MISCELLANEOUS) ×4 IMPLANT
SPONGE SURGIFOAM ABS GEL SZ50 (HEMOSTASIS) ×2 IMPLANT
STRIP CLOSURE SKIN 1/2X4 (GAUZE/BANDAGES/DRESSINGS) ×2 IMPLANT
SUT VIC AB 0 CT1 18XCR BRD8 (SUTURE) ×1 IMPLANT
SUT VIC AB 0 CT1 8-18 (SUTURE) ×2
SUT VIC AB 2-0 CP2 18 (SUTURE) ×2 IMPLANT
SUT VIC AB 3-0 SH 8-18 (SUTURE) ×2 IMPLANT
SYR 20ML ECCENTRIC (SYRINGE) ×2 IMPLANT
TOWEL OR 17X24 6PK STRL BLUE (TOWEL DISPOSABLE) ×2 IMPLANT
TOWEL OR 17X26 10 PK STRL BLUE (TOWEL DISPOSABLE) ×2 IMPLANT
WATER STERILE IRR 1000ML POUR (IV SOLUTION) ×2 IMPLANT

## 2011-09-21 NOTE — Transfer of Care (Signed)
Immediate Anesthesia Transfer of Care Note  Patient: Amy Dominguez  Procedure(s) Performed:  LUMBAR LAMINECTOMY/DECOMPRESSION MICRODISCECTOMY - lumbar laminectomy lumbar four-lumbar five  Patient Location: PACU  Anesthesia Type: General  Level of Consciousness: sedated  Airway & Oxygen Therapy: Patient Spontanous Breathing and Patient connected to face mask oxygen  Post-op Assessment: Report given to PACU RN, Post -op Vital signs reviewed and stable and Patient moving all extremities  Post vital signs: Reviewed and stable  Complications: No apparent anesthesia complications

## 2011-09-21 NOTE — Anesthesia Postprocedure Evaluation (Signed)
  Anesthesia Post-op Note  Patient: Amy Dominguez  Procedure(s) Performed:  LUMBAR LAMINECTOMY/DECOMPRESSION MICRODISCECTOMY - lumbar laminectomy lumbar four-lumbar five  Patient Location: PACU  Anesthesia Type: General  Level of Consciousness: awake  Airway and Oxygen Therapy: Patient Spontanous Breathing  Post-op Pain: mild  Post-op Assessment: Post-op Vital signs reviewed  Post-op Vital Signs: stable  Complications: No apparent anesthesia complications

## 2011-09-21 NOTE — Progress Notes (Signed)
Patient ID: Amy Dominguez, female   DOB: 07-06-1936, 76 y.o.   MRN: [id number]  Patient looks good postop. Ambulating well without leg pain. No numbness tingling or weakness. Appropriate back soreness. Good strength.

## 2011-09-21 NOTE — Anesthesia Preprocedure Evaluation (Addendum)
Anesthesia Evaluation  Patient identified by MRN, date of birth, ID band Patient awake    Reviewed: Allergy & Precautions, H&P , NPO status   History of Anesthesia Complications Negative for: history of anesthetic complications  Airway Mallampati: II  Neck ROM: Full    Dental  (+) Dental Advisory Given and Teeth Intact   Pulmonary shortness of breath, asthma ,  Last use of Advair 06-2011 clear to auscultation        Cardiovascular hypertension, Pt. on medications + dysrhythmias Irregular Normal    Neuro/Psych  Headaches,    GI/Hepatic negative GI ROS, Neg liver ROS,   Endo/Other  Hypothyroidism   Renal/GU negative Renal ROS     Musculoskeletal   Abdominal (+) obese,   Peds  Hematology   Anesthesia Other Findings   Reproductive/Obstetrics                         Anesthesia Physical Anesthesia Plan  ASA: II  Anesthesia Plan: General   Post-op Pain Management:    Induction: Intravenous  Airway Management Planned: Oral ETT  Additional Equipment:   Intra-op Plan:   Post-operative Plan: Extubation in OR  Informed Consent: I have reviewed the patients History and Physical, chart, labs and discussed the procedure including the risks, benefits and alternatives for the proposed anesthesia with the patient or authorized representative who has indicated his/her understanding and acceptance.   Dental advisory given  Plan Discussed with: CRNA, Surgeon and Anesthesiologist  Anesthesia Plan Comments:        Anesthesia Quick Evaluation

## 2011-09-21 NOTE — Op Note (Signed)
09/21/2011  11:20 AM  PATIENT:  Amy Dominguez  76 y.o. female  PRE-OPERATIVE DIAGNOSIS:   lumbar spinal stenosis L4-5 with back and leg pain and neurogenic claudication   POST-OPERATIVE DIAGNOSIS:  Same   PROCEDURE:  decompressive lumbar laminectomy , medial facetectomy , and foraminotomies L4-5 bilaterally   SURGEON:  Marikay Alar, MD  ASSISTANTS: Dr. Newell Coral   ANESTHESIA:   General  EBL: 25  ml  Total I/O In: 1000 [I.V.:1000] Out: -   BLOOD ADMINISTERED:none  DRAINS: MEDIUM HEMOVAC   SPECIMEN:  No Specimen  INDICATION FOR PROCEDURE:  this patient presented with back and bilateral leg pain with ambulation. CT myelogram showed severe spinal stenosis L4-5 . I recommended a decompressive laminectomy at L4-5.  Patient understood the risks, benefits, and alternatives and potential outcomes and wished to proceed.  PROCEDURE DETAILS: The patient was taken to the operating room and after induction of adequate generalized endotracheal anesthesia, the patient was rolled into the prone position on the Wilson frame and all pressure points were padded. The lumbar region was cleaned and then prepped with DuraPrep and draped in the usual sterile fashion. 5 cc of local anesthesia was injected and then a dorsal midline incision was made and carried down to the lumbo sacral fascia. The fascia was opened and the paraspinous musculature was taken down in a subperiosteal fashion to expose L4-5 bilaterally. Intraoperative x-ray confirmed my level, and then I used a combination of the high-speed drill and the Kerrison punches to perform a hemilaminectomy, medial facetectomy, and foraminotomy at L4-5 bilaterally. The underlying yellow ligament was opened and removed in a piecemeal fashion to expose the underlying dura and exiting nerve roots. I undercut the lateral recess and dissected down until I was medial to and distal to the pedicle. The nerve root was well decompressed on each side. I then  palpated with a coronary dilator along the nerve root and into the foramen to assure adequate decompression. I felt no more compression of the nerve root. The dura was full. I irrigated with saline solution containing bacitracin. Achieved hemostasis with bipolar cautery, lined the dura with Gelfoam, moist a medium Hemovac drain through separate stab incision and then closed the fascia with 0 Vicryl. I closed the subcutaneous tissues with 2-0 Vicryl and the subcuticular tissues with 3-0 Vicryl. The skin was then closed with benzoin and Steri-Strips. The drapes were removed, a sterile dressing was applied. The patient was awakened from general anesthesia and transferred to the recovery room in stable condition. At the end of the procedure all sponge, needle and instrument counts were correct.   PLAN OF CARE: Admit to inpatient   PATIENT DISPOSITION:  PACU - hemodynamically stable.   Delay start of Pharmacological VTE agent (>24hrs) due to surgical blood loss or risk of bleeding:  yes

## 2011-09-21 NOTE — Preoperative (Signed)
Beta Blockers   Reason not to administer Beta Blockers:Not Applicable 

## 2011-09-21 NOTE — H&P (Signed)
Subjective: Patient is a 76 y.o. female admitted for spinal stenosis L4-5. Onset of symptoms was several months ago, gradually worsening since that time.  The pain is rated moderate, and is located at the waist and radiates to legs. The pain is described as aching and occurs intermittently. The symptoms have been progressive. Symptoms are exacerbated by walking for more than a few minutes. MRI or CT showed spinal stenosis   Past Medical History  Diagnosis Date  . Arthralgia   . Spinal stenosis     medtronic adapta; spinal stenosis LS spine  . Dysrhythmia   . Hypertension   . Shortness of breath     WITH EXERTION   . Asthma     HX BRONCHITIS FALL 2012   . Arthritis     Past Surgical History  Procedure Date  . Polypectomy   . Hemorrhoid surgery   . Tonsillectomy   . Tubal ligation   . Insert / replace / remove pacemaker [facility] pacemaker  09/2009  . Foot surgery     JOINT REPLACEMENT LT GREAT TOE   20 YRS AGO   . Cataract extraction     LEFT EYE    Prior to Admission medications   Medication Sig Start Date End Date Taking? Authorizing Provider  Calcium Carbonate-Vitamin D (CALCIUM 600 + D PO) Take 2 tablets by mouth 2 (two) times daily.     Yes Historical Provider, MD  cetirizine (ZYRTEC) 10 MG tablet Take 10 mg by mouth daily.     Yes Historical Provider, MD  dabigatran (PRADAXA) 150 MG CAPS Take 150 mg by mouth every 12 (twelve) hours.     Yes Historical Provider, MD  fluticasone (FLONASE) 50 MCG/ACT nasal spray 1 spray by Nasal route daily. 11/26/10 11/26/11 Yes Pecola Lawless, MD  lisinopril (PRINIVIL,ZESTRIL) 20 MG tablet Take 20 mg by mouth daily.     Yes Historical Provider, MD  mometasone (ELOCON) 0.1 % ointment Two times daily as needed 05/27/11  Yes Pecola Lawless, MD  Multiple Vitamins-Minerals (MULTIVITAMINS THER. W/MINERALS) TABS Take 1 tablet by mouth daily.     Yes Historical Provider, MD  Nutritional Supplements (ESTROVEN PO) Take by mouth. 2 by  mouth daily    Yes Historical Provider, MD  potassium chloride SA (K-DUR,KLOR-CON) 20 MEQ tablet Take 20 mEq by mouth daily.     Yes Historical Provider, MD  S-Adenosylmethionine (SAM-E) 400 MG TABS Take by mouth daily.     Yes Historical Provider, MD  torsemide (DEMADEX) 20 MG tablet Take 1 tablet (20 mg total) by mouth daily. 07/28/11  Yes Jacolyn Reedy, PA  VITAMIN E PO Take 2 tablets by mouth daily.     Yes Historical Provider, MD  furosemide (LASIX) 20 MG tablet Take 20 mg by mouth daily.      Historical Provider, MD   Allergies  Allergen Reactions  . Acetaminophen     REACTION: BRAND  NAME CAUSES MIGRAINE  . Tylenol (Acetaminophen) Other (See Comments)    Brand name tylenol causes headache    History  Substance Use Topics  . Smoking status: Current Everyday Smoker    Types: Cigarettes  . Smokeless tobacco: Not on file  . Alcohol Use: Yes     socially    Family History  Problem Relation Age of Onset  . Stroke Mother   . Heart attack Father 29  . Breast cancer Sister   . Deep vein thrombosis Daughter  Review of Systems  Positive ROS: neg  All other systems have been reviewed and were otherwise negative with the exception of those mentioned in the HPI and as above.  Objective: Vital signs in last 24 hours: Temp:  [97.6 F (36.4 C)] 97.6 F (36.4 C) (01/23 0806) Pulse Rate:  [80] 80  (01/23 0806) Resp:  [18] 18  (01/23 0806) BP: (120)/(70) 120/70 mmHg (01/23 0806) SpO2:  [98 %] 98 % (01/23 0806)  General Appearance: Alert, cooperative, no distress, appears stated age Head: Normocephalic, without obvious abnormality, atraumatic Eyes: PERRL, conjunctiva/corneas clear, EOM's intact, fundi benign, both eyes      Ears: Normal TM's and external ear canals, both ears Throat: Lips, mucosa, and tongue normal; teeth and gums normal Neck: Supple, symmetrical, trachea midline, no adenopathy; thyroid: No enlargement/tenderness/nodules; no carotid bruit or JVD Back:  Symmetric, no curvature, ROM normal, no CVA tenderness Lungs: Clear to auscultation bilaterally, respirations unlabored Heart: Regular rate and rhythm, S1 and S2 normal, no murmur, rub or gallop Abdomen: Soft, non-tender, bowel sounds active all four quadrants, no masses, no organomegaly Extremities: Extremities normal, atraumatic, no cyanosis or edema Pulses: 2+ and symmetric all extremities Skin: Skin color, texture, turgor normal, no rashes or lesions  NEUROLOGIC:   Mental status: Alert and oriented x4,  no aphasia, good attention span, fund of knowledge, and memory Motor Exam - grossly normal Sensory Exam - grossly normal Reflexes: decreased Coordination - grossly normal Gait - grossly normal Balance - grossly normal Cranial Nerves: I: smell Not tested  II: visual acuity  OS: nl    OD: nl  II: visual fields Full to confrontation  II: pupils Equal, round, reactive to light  III,VII: ptosis None  III,IV,VI: extraocular muscles  Full ROM  V: mastication Normal  V: facial light touch sensation  Normal  V,VII: corneal reflex  Present  VII: facial muscle function - upper  Normal  VII: facial muscle function - lower Normal  VIII: hearing Not tested  IX: soft palate elevation  Normal  IX,X: gag reflex Present  XI: trapezius strength  5/5  XI: sternocleidomastoid strength 5/5  XI: neck flexion strength  5/5  XII: tongue strength  Normal    Data Review Lab Results  Component Value Date   WBC 8.4 09/14/2011   HGB 13.2 09/14/2011   HCT 39.5 09/14/2011   MCV 86.8 09/14/2011   PLT 210 09/14/2011   Lab Results  Component Value Date   NA 141 09/14/2011   K 4.1 09/14/2011   CL 103 09/14/2011   CO2 28 09/14/2011   BUN 18 09/14/2011   CREATININE 0.85 09/14/2011   GLUCOSE 104* 09/14/2011   Lab Results  Component Value Date   INR 1.01 09/21/2011    Assessment/Plan: Patient admitted for decompressive lumbar lam L4-5. Patient has failed conservative therapy.  I explained the  condition and procedure to the patient and answered any questions.  Patient wishes to proceed with procedure as planned. Understands risks/ benefits and typical outcomes of procedure.   Kathern Lobosco S 09/21/2011 9:19 AM

## 2011-09-22 ENCOUNTER — Encounter (HOSPITAL_COMMUNITY): Payer: Self-pay | Admitting: Neurological Surgery

## 2011-09-22 MED ORDER — OXYCODONE HCL 5 MG PO TABS: 5.0000 mg | ORAL_TABLET | ORAL | Status: AC | PRN

## 2011-09-22 NOTE — Discharge Summary (Signed)
Physician Discharge Summary  Patient ID: Amy Dominguez MRN: [id number] DOB/AGE: [birth year] 76 y.o.  Admit date: 09/21/2011 Discharge date: 09/22/2011  Admission Diagnoses: stenosis L4-5    Discharge Diagnoses: same   Discharged Condition: good  Hospital Course: The patient was admitted on 09/21/2011 and taken to the operating room where the patient underwent laminectomy L4-5. The patient tolerated the procedure well and was taken to the recovery room and then to the floor in stable condition. The hospital course was routine. There were no complications. The wound remained clean dry and intact. The patient remained afebrile with stable vital signs, and tolerated a regular diet. The patient continued to increase activities, and pain was well controlled with oral pain medications.   Consults: None  Significant Diagnostic Studies:  Results for orders placed during the hospital encounter of 09/21/11  PROTIME-INR      Component Value Range   Prothrombin Time 13.5  11.6 - 15.2 (seconds)   INR 1.01  0.00 - 1.49   APTT      Component Value Range   aPTT 29  24 - 37 (seconds)  HEPATIC FUNCTION PANEL      Component Value Range   Total Protein 6.8  6.0 - 8.3 (g/dL)   Albumin 3.6  3.5 - 5.2 (g/dL)   AST 17  0 - 37 (U/L)   ALT 7  0 - 35 (U/L)   Alkaline Phosphatase 73  39 - 117 (U/L)   Total Bilirubin 0.4  0.3 - 1.2 (mg/dL)   Bilirubin, Direct <7.8  0.0 - 0.3 (mg/dL)   Indirect Bilirubin NOT CALCULATED  0.3 - 0.9 (mg/dL)    Chest 2 View  2/95/6213  *RADIOLOGY REPORT*  Clinical Data: Preop lumbar laminectomy  CHEST - 2 VIEW  Comparison: 10/24/2009  Findings: Dual lead pacemaker is unchanged.  Heart size is normal. Negative for heart failure or pneumonia.  Lungs are clear.  IMPRESSION: No active cardiopulmonary disease.  Original Report Authenticated By: Camelia Phenes, M.D.   Dg Lumbar Spine 2-3 Views  09/21/2011  *RADIOLOGY REPORT*  Clinical Data: L4-L5 laminectomy, decompression and  microdiskectomy.  Intraoperative films.  LUMBAR SPINE - 2-3 VIEW  Comparison: Preoperative CT of the spine 08/01/2011.  Findings: Preoperative cross-table lateral film labeled #1 demonstrates a localizing probe in the soft tissues superficial to L4.  Intraoperative cross-table lateral view #2 demonstrates soft tissue retractors in place, and an additional probe of the soft tissues posterior to the L4-L5 interspace projecting immediately posterior to the facet joints.  IMPRESSION: Intraoperative localizing films, as above.  Original Report Authenticated By: Florencia Reasons, M.D.    Antibiotics:  Anti-infectives     Start     Dose/Rate Route Frequency Ordered Stop   09/21/11 1400   ceFAZolin (ANCEF) IVPB 1 g/50 mL premix        1 g 100 mL/hr over 30 Minutes Intravenous 3 times per day 09/21/11 1236 09/21/11 2248   09/21/11 1011   bacitracin 50,000 Units in sodium chloride irrigation 0.9 % 500 mL irrigation  Status:  Discontinued          As needed 09/21/11 1011 09/21/11 1119   09/21/11 0920   bacitracin 08657 UNITS injection     Comments: WALSH, AMY: cabinet override         09/21/11 0920 09/21/11 2129   09/20/11 1445   ceFAZolin (ANCEF) IVPB 1 g/50 mL premix  Status:  Discontinued        1 g 100 mL/hr  over 30 Minutes Intravenous 60 min pre-op 09/20/11 1444 09/20/11 1444   09/20/11 1445   ceFAZolin (ANCEF) IVPB 2 g/50 mL premix        2 g 100 mL/hr over 30 Minutes Intravenous 60 min pre-op 09/20/11 1444 09/21/11 1010          Discharge Exam: Blood pressure 113/47, pulse 65, temperature 98.2 F (36.8 C), temperature source Oral, resp. rate 16, SpO2 98.00%. Neurologic: Grossly normal  Discharge Medications:   Medication List  As of 09/22/2011  7:47 AM   TAKE these medications         CALCIUM 600 + D PO   Take 2 tablets by mouth 2 (two) times daily.      cetirizine 10 MG tablet   Commonly known as: ZYRTEC   Take 10 mg by mouth daily.      dabigatran 150 MG Caps   Commonly  known as: PRADAXA   Take 150 mg by mouth every 12 (twelve) hours.      ESTROVEN PO   Take by mouth. 2 by mouth daily      fluticasone 50 MCG/ACT nasal spray   Commonly known as: FLONASE   1 spray by Nasal route daily.      furosemide 20 MG tablet   Commonly known as: LASIX   Take 20 mg by mouth daily.      lisinopril 20 MG tablet   Commonly known as: PRINIVIL,ZESTRIL   Take 20 mg by mouth daily.      mometasone 0.1 % ointment   Commonly known as: ELOCON   Two times daily as needed      multivitamins ther. w/minerals Tabs   Take 1 tablet by mouth daily.      oxyCODONE 5 MG immediate release tablet   Commonly known as: Oxy IR/ROXICODONE   Take 1 tablet (5 mg total) by mouth every 4 (four) hours as needed for pain.      potassium chloride SA 20 MEQ tablet   Commonly known as: K-DUR,KLOR-CON   Take 20 mEq by mouth daily.      SAM-e 400 MG Tabs   Take by mouth daily.      torsemide 20 MG tablet   Commonly known as: DEMADEX   Take 1 tablet (20 mg total) by mouth daily.      VITAMIN E PO   Take 2 tablets by mouth daily.            Disposition: home    Final Dx: DLL for stenosis  Discharge Orders    Future Appointments: Provider: Department: Dept Phone: Center:   10/19/2011 11:30 AM Duke Salvia, MD [facility] [id number]     Future Orders Please Complete By Expires   Diet - low sodium heart healthy      Increase activity slowly      Call MD for:  temperature >100.4      Call MD for:  persistant nausea and vomiting      Call MD for:  severe uncontrolled pain      Call MD for:  redness, tenderness, or signs of infection (pain, swelling, redness, odor or green/yellow discharge around incision site)      Call MD for:  difficulty breathing, headache or visual disturbances      Driving Restrictions      Comments:   1 week      Follow-up Information    Follow up with Tyon Cerasoli S, MD in 2 weeks.  Contact information:   [address] [place] [address] [phone]           Signed: Tia Alert 09/22/2011, 7:47 AM

## 2011-09-22 NOTE — Progress Notes (Signed)
UR COMPLETED  

## 2011-09-30 ENCOUNTER — Other Ambulatory Visit: Payer: Self-pay | Admitting: Cardiology

## 2011-10-19 ENCOUNTER — Encounter: Payer: Medicare Other | Admitting: Internal Medicine

## 2011-11-16 ENCOUNTER — Encounter: Payer: Self-pay | Admitting: Internal Medicine

## 2011-11-16 ENCOUNTER — Ambulatory Visit (INDEPENDENT_AMBULATORY_CARE_PROVIDER_SITE_OTHER): Payer: Medicare Other | Admitting: Internal Medicine

## 2011-11-16 VITALS — BP 116/69 | HR 76 | Ht 62.0 in | Wt 189.0 lb

## 2011-11-16 DIAGNOSIS — I495 Sick sinus syndrome: Secondary | ICD-10-CM

## 2011-11-16 DIAGNOSIS — I10 Essential (primary) hypertension: Secondary | ICD-10-CM

## 2011-11-16 DIAGNOSIS — I4892 Unspecified atrial flutter: Secondary | ICD-10-CM

## 2011-11-16 DIAGNOSIS — Z950 Presence of cardiac pacemaker: Secondary | ICD-10-CM

## 2011-11-16 DIAGNOSIS — M791 Myalgia, unspecified site: Secondary | ICD-10-CM

## 2011-11-16 DIAGNOSIS — IMO0001 Reserved for inherently not codable concepts without codable children: Secondary | ICD-10-CM

## 2011-11-16 LAB — PACEMAKER DEVICE OBSERVATION
AL IMPEDENCE PM: 441 Ohm
AL THRESHOLD: 0.375 V
BATTERY VOLTAGE: 2.79 V
RV LEAD AMPLITUDE: 16 mv
RV LEAD IMPEDENCE PM: 631 Ohm
VENTRICULAR PACING PM: 0

## 2011-11-16 MED ORDER — TORSEMIDE 20 MG PO TABS: 20.0000 mg | ORAL_TABLET | Freq: Every day | ORAL | Status: DC

## 2011-11-16 NOTE — Patient Instructions (Addendum)
Remote monitoring is used to monitor your Pacemaker of ICD from home. This monitoring reduces the number of office visits required to check your device to one time per year. It allows [place] to keep an eye on the functioning of your device to ensure it is working properly. You are scheduled for a device check from home on February 16, 2012. You may send your transmission at any time that day. If you have a wireless device, the transmission will be sent automatically. After your physician reviews your transmission, you will receive a postcard with your next transmission date.  Your physician wants you to follow-up in: 1 year with Dr Graciela Husbands. You will receive a reminder letter in the mail two months in advance. If you don't receive a letter, please call our office to schedule the follow-up appointment.  Your physician recommends that you have lab work today: ESR

## 2011-11-16 NOTE — Progress Notes (Signed)
  HPI  Amy Dominguez is a 76 y.o. female is seen in followup for pacemaker implantation for symptomatic sinus node dysfunction. She has a history of mild aortic stenosis.  Her dyspnea was much improved. She is able to climb a flight of stairs  major complaint is pain and aching in her thighs; there is none her shoulders  her thromboembolic risk factors are noted for hypertension age and gender for CHADS VASC score of 3   Past Medical History  Diagnosis Date  . Arthralgia   . Spinal stenosis     medtronic adapta; spinal stenosis LS spine  . Dysrhythmia   . Hypertension   . Shortness of breath     WITH EXERTION   . Asthma     HX BRONCHITIS FALL 2012   . Arthritis     Past Surgical History  Procedure Date  . Polypectomy   . Hemorrhoid surgery   . Tonsillectomy   . Tubal ligation   . Insert / replace / remove pacemaker [facility] pacemaker  09/2009  . Foot surgery     JOINT REPLACEMENT LT GREAT TOE   20 YRS AGO   . Cataract extraction     LEFT EYE  . Lumbar laminectomy/decompression microdiscectomy 09/21/2011    Procedure: LUMBAR LAMINECTOMY/DECOMPRESSION MICRODISCECTOMY;  Surgeon: Tia Alert, MD;  Location: [facility];  Service: Neurosurgery;  Laterality: N/A;  lumbar laminectomy lumbar four-lumbar five    Current Outpatient Prescriptions  Medication Sig Dispense Refill  . Calcium Carbonate-Vitamin D (CALCIUM 600 + D PO) Take 2 tablets by mouth 2 (two) times daily.        . cetirizine (ZYRTEC) 10 MG tablet Take 10 mg by mouth daily.        . dabigatran (PRADAXA) 150 MG CAPS Take 150 mg by mouth every 12 (twelve) hours.        . fluticasone (FLONASE) 50 MCG/ACT nasal spray 1 spray by Nasal route daily.  16 g  2  . lisinopril (PRINIVIL,ZESTRIL) 20 MG tablet Take 20 mg by mouth daily.        . mometasone (ELOCON) 0.1 % ointment Two times daily as needed  45 g  0  . Multiple Vitamins-Minerals (MULTIVITAMINS THER. W/MINERALS) TABS Take 1 tablet by mouth daily.         . potassium chloride SA (K-DUR,KLOR-CON) 20 MEQ tablet Take 20 mEq by mouth daily.        . S-Adenosylmethionine (SAM-E) 400 MG TABS Take by mouth daily.        Marland Kitchen torsemide (DEMADEX) 20 MG tablet Take 1 tablet (20 mg total) by mouth daily.  90 tablet  3  . VITAMIN E PO Take 2 tablets by mouth daily.          Allergies  Allergen Reactions  . Tylenol (Acetaminophen) Other (See Comments)    Brand name tylenol causes headache    Review of Systems negative except from HPI and PMH  Physical Exam BP 116/69  Pulse 76  Ht 5\' 2"  (1.575 m)  Wt 189 lb (85.73 kg)  BMI 34.57 kg/m2  LMP 09/14/2011 Well developed and nourished in no acute distress HENT normal Neck supple with JVP-flat Clear Regular rate and rhythm, no murmurs or gallops Abd-soft with active BS No Clubbing cyanosis edema Skin-warm and dry A & Oriented  Grossly normal sensory and motor function    Assessment and  Plan

## 2011-11-16 NOTE — Assessment & Plan Note (Addendum)
Has had one episode of atrial fibrillation. It was associated with a rapid ventricular response duration was about 1-1/2 hours. We will continue her on pradax

## 2011-11-16 NOTE — Assessment & Plan Note (Signed)
The patient's device was interrogated.  The information was reviewed. No changes were made in the programming.    

## 2011-11-16 NOTE — Assessment & Plan Note (Signed)
This is new. I worry about polymyalgia rheumatica; we will check a sedimentation rate

## 2011-11-16 NOTE — Assessment & Plan Note (Signed)
Well-controlled on current meds 

## 2011-11-23 ENCOUNTER — Telehealth: Payer: Self-pay

## 2011-11-23 NOTE — Telephone Encounter (Signed)
I called home number x 2, unable to make connection. I left message on voicemail at cell number for patient to call for an appointment ro f/u on labs

## 2011-11-23 NOTE — Telephone Encounter (Signed)
Message copied by Maurice Small on Wed Nov 23, 2011  9:10 AM ------      Message from: Pecola Lawless      Created: Tue Nov 22, 2011  9:08 PM       Sedimentation rate is a non specific test which is elevated with any inflammatory process ( Ex. Active Rheumatoid Arthritis; certain forms of thyroiditis,etc).Appt needed for me to assess significance, impossible to assess  w/o history & exam.

## 2011-11-28 ENCOUNTER — Encounter: Payer: Self-pay | Admitting: Internal Medicine

## 2011-11-28 ENCOUNTER — Ambulatory Visit (INDEPENDENT_AMBULATORY_CARE_PROVIDER_SITE_OTHER): Payer: Medicare Other | Admitting: Internal Medicine

## 2011-11-28 ENCOUNTER — Ambulatory Visit: Payer: Medicare Other | Admitting: Internal Medicine

## 2011-11-28 VITALS — BP 120/72 | HR 80 | Temp 98.3°F | Wt 192.0 lb

## 2011-11-28 DIAGNOSIS — M549 Dorsalgia, unspecified: Secondary | ICD-10-CM

## 2011-11-28 MED ORDER — GABAPENTIN 100 MG PO CAPS: 100.0000 mg | ORAL_CAPSULE | Freq: Three times a day (TID) | ORAL | Status: DC

## 2011-11-28 NOTE — Patient Instructions (Signed)
Assess response to the gabapentin one every 8 hours as needed. If it is partially beneficial, it can be increased up to a total of 3 pills every 8 hours as needed. This increase of 1 pill each dose  should take place over 72 hours at least. 

## 2011-11-28 NOTE — Progress Notes (Signed)
Subjective:    Patient ID: Amy Dominguez, female    DOB: 02/06/1936, 76 y.o.   MRN: [id number]  HPI Back pain Location:across LS area Onset:3 weeks ago w/o trigger; gradual onset Quality:"charley horse " like Duration:constant 24/7 Severity:up to 8 Exacerbating factors :worse after standing for 3-5 min; it radiates superiorly to shoulders as "deep muscle ache"  Relievers:muscle relaxant & 5 mg oxycodone  Treatment response: pain reduced to level 4 with meds Associated signs and symptoms: Blurred/double/loss of vision:no Weakness/gait dysfunction/numbness and tingling/stool or urine incontinence:no. She has altered her gait because of the pain. In January she had a laminectomy for spinal stenosis by Dr. Yetta Barre. Prior to that surgery she would have pain in the back which radiated down both legs after walking a block and a half. That has not recurred since surgery  She mentions polymyalgia rheumatica as a possible etiology. She does not describe persistent neck and shoulder girdle pain , headaches or hip girdle pain.        Review of Systems Fever/chills/sweats/weight loss:no Chest pain/palpitations/irregular rhythm:no ; Dr Graciela Husbands checked pacer recently Cough/sputum/hemoptysis/dyspnea/pleurisy:no Abdominal pain/melena/rectal bleeding/bowel change:no Flank pain/dysuria/hematuria/pyuria:no Rash/change in temperature or color:no Bruising/bleeding/lymphadenopathy:no      Objective:   Physical Exam Gen.:  well-nourished in appearance. Alert, appropriate and cooperative throughout exam. Head: Normocephalic without obvious abnormalities . There is no tenderness to palpation of the temples. Eyes: No corneal or conjunctival inflammation noted.  Extraocular motion intact.  Mouth: Oral mucosa and oropharynx reveal no lesions or exudates. Teeth in good repair.No tongue deviation Neck: No deformities, masses, or tenderness noted. Range of motion very good Lungs: Normal respiratory effort;  chest expands symmetrically. Lungs are clear to auscultation without rales, wheezes, or increased work of breathing. Heart: Normal rate and rhythm. Normal S1 and S2. No gallop, click, or rub. Grade 1/6 systolic murmur  Abdomen: Bowel sounds normal; abdomen soft and nontender. No masses, organomegaly or hernias noted. No AAA                                                                            Musculoskeletal/extremities: Slight lordosis noted of  the thoracic or lumbar spine. No clubbing, cyanosis, or edema noted. Range of motion  normal .Tone & strength  Normal.Joints: minor DIP changes. There is fusiform change of the right knee with crepitus. She is able to lie flat and sit up without help. Straight leg raising is negative to 90 bilaterally. Nail health  good. Symptoms are not elicited by range of motion of the hips. There is minimal discomfort in the left shoulder with range of motion Vascular:  dorsalis pedis and  posterior tibial pulses are slightly decreased. No bruits present. Neurologic: Alert and oriented x3. Deep tendon reflexes symmetrical and normal. Date including tiptoe and heel walking are normal          Skin: Intact without suspicious lesions or rashes. Lymph: No cervical, axillary lymphadenopathy present. Psych: Mood and affect are normal. Normally interactive  Assessment & Plan:  #1 lumbosacral pain aggravated by standing with superior radiation of the pain. Historically and clinically polymyalgia rheumatica is not suggested.  Plan: See orders and recommendations.

## 2011-12-06 NOTE — Telephone Encounter (Signed)
Patient was seen on 11/28/11

## 2011-12-26 ENCOUNTER — Telehealth: Payer: Self-pay | Admitting: Internal Medicine

## 2011-12-26 DIAGNOSIS — I10 Essential (primary) hypertension: Secondary | ICD-10-CM

## 2011-12-26 MED ORDER — TORSEMIDE 20 MG PO TABS: 20.0000 mg | ORAL_TABLET | Freq: Every day | ORAL | Status: DC

## 2011-12-26 MED ORDER — POTASSIUM CHLORIDE CRYS ER 20 MEQ PO TBCR: 20.0000 meq | EXTENDED_RELEASE_TABLET | Freq: Every day | ORAL | Status: DC

## 2011-12-26 NOTE — Telephone Encounter (Signed)
PT CALLING RE MEDICATION

## 2011-12-26 NOTE — Telephone Encounter (Signed)
I spoke with the patient. She states she is on Pradaxa, but also has inflammatory discomfort in her back that she has been taking Aleve two in the morning and one in the evening for the past 2 weeks. She states this helps, but she also read with her Pradaxa, not to take this. She has been given a prescription for Oxycontin 5 mg to take by her orthopedic doctor. She states she does not want to take this because it does nothing for the inflammation. She can take acetaminophen, but this doesn't help either. She would like to know what a possible alternative might be for her back pain. I explained I would review with Dr. Graciela Husbands and call her back. ( Is celebrex a possibility?)

## 2011-12-26 NOTE — Telephone Encounter (Signed)
celebrex is a reasonable alternative thanks steve

## 2011-12-27 NOTE — Telephone Encounter (Signed)
I spoke with the patient and she is aware of Dr. Odessa Fleming recommendations. I have advised she discuss obtaining this from her PCP or orthopedist. She is agreeable.

## 2011-12-28 ENCOUNTER — Encounter: Payer: Self-pay | Admitting: Internal Medicine

## 2011-12-28 ENCOUNTER — Ambulatory Visit (INDEPENDENT_AMBULATORY_CARE_PROVIDER_SITE_OTHER): Payer: Medicare Other | Admitting: Internal Medicine

## 2011-12-28 VITALS — BP 122/80 | HR 82 | Temp 97.7°F | Resp 14 | Ht 62.0 in | Wt 190.0 lb

## 2011-12-28 DIAGNOSIS — Z9889 Other specified postprocedural states: Secondary | ICD-10-CM

## 2011-12-28 DIAGNOSIS — E785 Hyperlipidemia, unspecified: Secondary | ICD-10-CM

## 2011-12-28 DIAGNOSIS — Z860100 Personal history of colon polyps, unspecified: Secondary | ICD-10-CM

## 2011-12-28 DIAGNOSIS — E039 Hypothyroidism, unspecified: Secondary | ICD-10-CM

## 2011-12-28 DIAGNOSIS — M545 Low back pain, unspecified: Secondary | ICD-10-CM

## 2011-12-28 DIAGNOSIS — Z0000 Encounter for general adult medical examination without abnormal findings: Secondary | ICD-10-CM

## 2011-12-28 DIAGNOSIS — N259 Disorder resulting from impaired renal tubular function, unspecified: Secondary | ICD-10-CM

## 2011-12-28 DIAGNOSIS — I10 Essential (primary) hypertension: Secondary | ICD-10-CM

## 2011-12-28 LAB — TSH: TSH: 1.95 u[IU]/mL (ref 0.35–5.50)

## 2011-12-28 LAB — HEPATIC FUNCTION PANEL
ALT: 11 U/L (ref 0–35)
Bilirubin, Direct: 0.1 mg/dL (ref 0.0–0.3)
Total Bilirubin: 1.1 mg/dL (ref 0.3–1.2)
Total Protein: 7 g/dL (ref 6.0–8.3)

## 2011-12-28 LAB — CBC WITH DIFFERENTIAL/PLATELET
Eosinophils Absolute: 0.2 10*3/uL (ref 0.0–0.7)
Eosinophils Relative: 2.5 % (ref 0.0–5.0)
HCT: 39.3 % (ref 36.0–46.0)
Lymphs Abs: 2.1 10*3/uL (ref 0.7–4.0)
MCHC: 32.7 g/dL (ref 30.0–36.0)
MCV: 87.4 fl (ref 78.0–100.0)
Monocytes Absolute: 0.7 10*3/uL (ref 0.1–1.0)
Neutrophils Relative %: 59.4 % (ref 43.0–77.0)
Platelets: 178 10*3/uL (ref 150.0–400.0)

## 2011-12-28 LAB — LIPID PANEL
Cholesterol: 219 mg/dL — ABNORMAL HIGH (ref 0–200)
HDL: 73.5 mg/dL (ref >39.00)
Total CHOL/HDL Ratio: 3
Triglycerides: 121 mg/dL (ref 0.0–149.0)

## 2011-12-28 LAB — LDL CHOLESTEROL, DIRECT: Direct LDL: 125.9 mg/dL

## 2011-12-28 LAB — BASIC METABOLIC PANEL
BUN: 18 mg/dL (ref 6–23)
CO2: 27 mEq/L (ref 19–32)
Chloride: 104 mEq/L (ref 96–112)
Creatinine, Ser: 0.8 mg/dL (ref 0.4–1.2)
Glucose, Bld: 94 mg/dL (ref 70–99)
Potassium: 4.1 mEq/L (ref 3.5–5.1)

## 2011-12-28 MED ORDER — CELECOXIB 200 MG PO CAPS: ORAL_CAPSULE | ORAL | Status: DC

## 2011-12-28 NOTE — Progress Notes (Signed)
Subjective:    Patient ID: Amy Dominguez, female    DOB: 1936/01/16, 76 y.o.   MRN: [id number]  HPI Medicare Wellness Visit:  The following psychosocial & medical history were reviewed as required by Medicare.   Social history: caffeine: 1-1.5 cups / day , alcohol: 3 glasses wine / week ,  tobacco use : 1/4 ppd  & exercise : not since back surgery.   Home & personal  safety / fall risk: no issues, activities of daily living: no limitations , seatbelt use : yes , and smoke alarm employment : yes .  Power of Attorney/Living Will status : in place  Vision ( as recorded per Nurse) & Hearing  evaluation :  Ophth exam < 4 weeks Orientation :oriented X 3 , memory & recall :good,  math testing: good,and mood & affect : normal . Depression / anxiety: denied Travel history : 2011 [place] , immunization status :up to date , transfusion history:  no, and preventive health surveillance ( colonoscopies, BMD , etc as per protocol/ [facility]): colonoscopy due 2021, Dental care:  Every 4 mos . Chart reviewed &  Updated. Active issues reviewed & addressed.       Review of Systems  The back pain was dramatically responsive to a course of steroids. It was attributed to inflammation following prolonged travel. The neurosurgeon did not recommend long-term steroids, but suggested Aleve. This may have caused renal compromise in the past. Additionally she is on Pradaxa. Her cardiologist has raised the question of using Celebrex  instead.     Objective:   Physical Exam Gen.:  well-nourished in appearance. Alert, appropriate and cooperative throughout exam. Head: Normocephalic without obvious abnormalities Eyes: No corneal or conjunctival inflammation noted. Pupils equal round reactive to light and accommodation. Extraocular motion intact. Variable lenses worn  Ears: External  ear exam reveals no significant lesions or deformities. Canals clear .TMs normal. Hearing is grossly normal bilaterally. Nose: External nasal  exam reveals no deformity or inflammation. Nasal mucosa are pink and moist. No lesions or exudates noted.  Mouth: Oral mucosa and oropharynx reveal no lesions or exudates. Teeth in good repair. Neck: No deformities, masses, or tenderness noted. Range of motion & Thyroid normal Lungs: Normal respiratory effort; chest expands symmetrically. Lungs are clear to auscultation without rales, wheezes, or increased work of breathing. Heart: Normal rate and rhythm. Normal S1 and S2. No gallop, click, or rub.Grade 1- 1.5/6 systolic murmur at the base with bilateral carotid radiation of the murmur Abdomen: Bowel sounds normal; abdomen soft and nontender. No masses, organomegaly or hernias noted. Genitalia: Dr [name] Musculoskeletal/extremities: No deformity or scoliosis noted of  the thoracic or lumbar spine. No clubbing, cyanosis, edema, or deformity noted. Range of motion  normal .Tone & strength  normal. Nail health  good. DIP osteoarthritic changes present. Straight leg raising is negative bilaterally. She is able to lie back and sit up without help Vascular: Carotid, radial artery, dorsalis pedis and  posterior tibial pulses are full and equal. Neurologic: Alert and oriented x3. Deep tendon reflexes symmetrical and normal.          Skin: Intact without suspicious lesions or rashes. Lymph: No cervical, axillary lymphadenopathy present. Psych: Mood and affect are normal. Normally interactive  Assessment & Plan:  #1 Medicare Wellness Exam; criteria met ; data entered #2 Problem List reviewed ; Assessment/ Recommendations made #3 chronic low back problems. Status post neurosurgery with good result. Past history of nonsteroidal induced renal insufficiency. Paraxa therapy is significant factor. Trial of Celebrex as needed as infrequently as possible. Plan: see Orders

## 2011-12-28 NOTE — Assessment & Plan Note (Signed)
Past medical history of renal insufficiency possibly due to nonsteroidal agents (Aleve). She is on Pradaxa so for her cardiac dysrhythmias. Celebrex appears to be the best compromise along with non branded acetaminophen as needed.

## 2011-12-28 NOTE — Patient Instructions (Signed)
The best exercises for the low back include freestyle swimming, stretch aerobics, and yoga.  Please try to go on My Chart within the next 24 hours to allow me to release the results directly to you.  

## 2012-02-16 ENCOUNTER — Ambulatory Visit (INDEPENDENT_AMBULATORY_CARE_PROVIDER_SITE_OTHER): Payer: Medicare Other | Admitting: *Deleted

## 2012-02-16 DIAGNOSIS — I4892 Unspecified atrial flutter: Secondary | ICD-10-CM

## 2012-02-16 DIAGNOSIS — Z950 Presence of cardiac pacemaker: Secondary | ICD-10-CM

## 2012-02-21 ENCOUNTER — Telehealth: Payer: Self-pay | Admitting: Internal Medicine

## 2012-02-21 NOTE — Telephone Encounter (Signed)
Patient was due to send on 02-16-2012 and missed transmission. Advised to send in next couple of days.

## 2012-02-21 NOTE — Telephone Encounter (Signed)
Pt forgot date she was send transmission and she would like for you to call and give her the date and you can leave a message if she doesn't answer

## 2012-02-22 ENCOUNTER — Encounter: Payer: Self-pay | Admitting: Internal Medicine

## 2012-02-24 LAB — REMOTE PACEMAKER DEVICE
AL IMPEDENCE PM: 448 Ohm
AL THRESHOLD: 0.375 V
BATTERY VOLTAGE: 2.79 V
RV LEAD AMPLITUDE: 22.4 mv

## 2012-02-27 ENCOUNTER — Telehealth: Payer: Self-pay | Admitting: Internal Medicine

## 2012-02-27 DIAGNOSIS — I10 Essential (primary) hypertension: Secondary | ICD-10-CM

## 2012-02-27 NOTE — Telephone Encounter (Signed)
Please return call to patient at [phone] regarding medication.

## 2012-02-27 NOTE — Telephone Encounter (Signed)
Spoke with Amy Dominguez who reports she takes Torsemide 20 mg daily and has been taking an additional half tablet 2-3 times per week during the summer due to summer heat causing her to have ankle swelling.  Extra torsemide has helped swelling.  Amy Dominguez is asking if OK with Dr. Graciela Husbands to continue this and if so she will need prescription for refills with additional tablets per month [facility] to [facility], [place].  (this pharmacy is listed in her pharmacy list). I told her I would send message to Dr.Klein and prescription would be sent if OK with him.  I told her we would call her if any problems

## 2012-02-27 NOTE — Telephone Encounter (Signed)
This is a fine idea

## 2012-02-28 MED ORDER — TORSEMIDE 20 MG PO TABS: 20.0000 mg | ORAL_TABLET | Freq: Every day | ORAL | Status: DC

## 2012-02-28 MED ORDER — TORSEMIDE 10 MG PO TABS: ORAL_TABLET | ORAL | Status: DC

## 2012-02-28 NOTE — Telephone Encounter (Signed)
Spoke with pt, aware opf dr Odessa Fleming recommendations. Script sent to pharm.

## 2012-03-09 ENCOUNTER — Encounter: Payer: Self-pay | Admitting: *Deleted

## 2012-05-23 ENCOUNTER — Telehealth: Payer: Self-pay | Admitting: Internal Medicine

## 2012-05-23 NOTE — Telephone Encounter (Signed)
I reviewed the patient's situation with Dr. Shirlee Latch (DOD). OK to hold pradaxa for a week. I have notified the patient and she verbalizes understanding.

## 2012-05-23 NOTE — Telephone Encounter (Signed)
I spoke with the patient. She states she has had vaginal bleeding for about a week. She is pending a hysteroscopy with Dr. Farrel Gobble probably within the next week. She states she has intermittently gone off her Pradaxa for a day at a time and her bleeding has slowed. I explained I need to get the approval of the MD to hold this. I will review with the DOD and call her back. She is agreeable.

## 2012-05-23 NOTE — Telephone Encounter (Signed)
Pt needs to go off pradaxa and wants to discuss this and she needs a call before 1:30 because she will be going out

## 2012-05-24 ENCOUNTER — Encounter (HOSPITAL_COMMUNITY): Payer: Self-pay | Admitting: Pharmacist

## 2012-05-25 ENCOUNTER — Encounter (HOSPITAL_COMMUNITY)
Admission: RE | Admit: 2012-05-25 | Discharge: 2012-05-25 | Disposition: A | Discharge status: Home / Self Care | Payer: Medicare Other | Source: Ambulatory Visit | Attending: Gynecology | Admitting: Gynecology

## 2012-05-25 ENCOUNTER — Other Ambulatory Visit: Payer: Self-pay

## 2012-05-25 ENCOUNTER — Inpatient Hospital Stay (HOSPITAL_COMMUNITY): Admission: RE | Admit: 2012-05-25 | Payer: Medicare Other | Source: Ambulatory Visit

## 2012-05-25 ENCOUNTER — Encounter (HOSPITAL_COMMUNITY): Payer: Self-pay

## 2012-05-25 HISTORY — DX: Headache: R51

## 2012-05-25 HISTORY — DX: Other complications of anesthesia, initial encounter: T88.59XA

## 2012-05-25 HISTORY — DX: Adverse effect of unspecified anesthetic, initial encounter: T41.45XA

## 2012-05-25 HISTORY — DX: Bradycardia, unspecified: R00.1

## 2012-05-25 LAB — CBC
HCT: 39.8 % (ref 36.0–46.0)
MCH: 28.4 pg (ref 26.0–34.0)
MCHC: 32.4 g/dL (ref 30.0–36.0)
MCV: 87.7 fL (ref 78.0–100.0)
RDW: 13.8 % (ref 11.5–15.5)

## 2012-05-25 LAB — BASIC METABOLIC PANEL
Calcium: 10.1 mg/dL (ref 8.4–10.5)
Chloride: 99 mEq/L (ref 96–112)
Creatinine, Ser: 1 mg/dL (ref 0.50–1.10)
GFR calc Af Amer: 63 mL/min — ABNORMAL LOW (ref >90)

## 2012-05-25 NOTE — Pre-Procedure Instructions (Signed)
All history reviewed with Dr Kern Alberta, EKG, Pacer history, Atrial fib, Sinus node dysfunction, Pradaxa d/c on 9/25 for surger- Ok for LSD

## 2012-05-25 NOTE — Patient Instructions (Addendum)
   Your procedure is scheduled on: Tuesday October 1st   Enter through the Main Entrance of [facility] at: 8:45am Pick up the phone at the desk and dial [phone] and inform us of your arrival.  Please call this number if you have any problems the morning of surgery: [phone]  Remember: Do not eat or drink anything after midnight on Monday Take these medicines the morning of surgery with a SIP OF WATER:  Do not wear jewelry, make-up, or FINGER nail polish No metal in your hair or on your body. Do not wear lotions, powders, perfumes. You may wear deodorant.  Please use your CHG wash as directed prior to surgery.  Do not shave anywhere for at least 12 hours prior to first CHG shower.  Do not bring valuables to the hospital. Contacts may not be worn into surgery.  Patients discharged on the day of surgery will not be allowed to drive home.

## 2012-05-28 ENCOUNTER — Encounter: Payer: Self-pay | Admitting: Internal Medicine

## 2012-05-28 ENCOUNTER — Ambulatory Visit (INDEPENDENT_AMBULATORY_CARE_PROVIDER_SITE_OTHER): Payer: Medicare Other | Admitting: *Deleted

## 2012-05-28 DIAGNOSIS — I4892 Unspecified atrial flutter: Secondary | ICD-10-CM

## 2012-05-28 DIAGNOSIS — I495 Sick sinus syndrome: Secondary | ICD-10-CM

## 2012-05-28 NOTE — H&P (Signed)
Amy Dominguez is an 76 y.o. female who presents to the office with bright red bleeding that changed to dark brown after several days.  Is on pradaxa, and until this summer had been taking OTC estroven for hot flashes.  EMB revealed significant old blood with scant tissue, u/s c/w hematometria with a cystic/solid mass 4x3.1cm.  No color flow doppler.  Pertinent Gynecological History: Menses: post-menopausal Bleeding: post menopausal bleeding Contraception: none DES exposure: denies Blood transfusions: none Sexually transmitted diseases: no past history Previous GYN Procedures: DNC  Last mammogram: normal Date: 06/08/10 Last pap: normal Date: 05/17/11 OB History: G5, P4   Menstrual History: Menarche age: unk     Past Medical History  Diagnosis Date  . Arthralgia   . Spinal stenosis     spinal stenosis LS spine  . Hypertension   . Asthma 2008 , 2012    RAD after BRONCHITIS    . Arthritis     hands, feet, hips, knees  . Headache     allergy related  . Complication of anesthesia     slow to respond  . Dysrhythmia     atrial fib-Medtronic Pacemaker-on pradaxa-off for surgery since 9.25 2013  . Symptomatic bradycardia 2011    Insertion of Medtronic Pacer    Past Surgical History  Procedure Date  . Polypectomy 2008     negative 2011,Dr Jarold Motto  . Hemorrhoid surgery   . Tonsillectomy   . Tubal ligation   . Insert / replace / remove pacemaker [facility] pacemaker  09/2009  . Foot surgery     JOINT REPLACEMENT LT GREAT TOE   20 YRS AGO   . Cataract extraction     OS  . Lumbar laminectomy/decompression microdiscectomy 09/21/2011    Procedure: LUMBAR LAMINECTOMY/DECOMPRESSION MICRODISCECTOMY;  Surgeon: Tia Alert, MD;  Location: [facility];  Service: Neurosurgery;  Laterality: N/A;  lumbar laminectomy lumbar four-lumbar five    Family History  Problem Relation Age of Onset  . Stroke Mother     post valve replacement; warfarin missed  . Heart attack  Father 64  . Breast cancer Sister   . Deep vein thrombosis Daughter     Factor S non functional    Social History:  reports that she has been smoking Cigarettes.  She has a 13.5 pack-year smoking history. She does not have any smokeless tobacco history on file. She reports that she drinks about 1.8 ounces of alcohol per week. She reports that she does not use illicit drugs.  Allergies:  Allergies  Allergen Reactions  . Tylenol (Acetaminophen) Other (See Comments)    Brand name tylenol causes headache    No prescriptions prior to admission    Review of Systems  Constitutional: Negative.   HENT: Positive for congestion.   Eyes: Negative.   Respiratory: Negative.   Cardiovascular: Negative.   Gastrointestinal: Positive for heartburn.  Genitourinary: Negative.   Musculoskeletal: Positive for myalgias.  Skin: Negative.   Neurological: Negative.   Endo/Heme/Allergies: Negative.   Psychiatric/Behavioral: Negative.     Height 5\' 2"  (1.575 m), weight 83.462 kg (184 lb), last menstrual period 09/14/2011. Physical Exam  Constitutional: She is oriented to person, place, and time. She appears well-developed and well-nourished.  HENT:  Head: Normocephalic and atraumatic.  Eyes: Conjunctivae normal and EOM are normal. Pupils are equal, round, and reactive to light.  Neck: Normal range of motion. Neck supple.  Cardiovascular: Normal rate, regular rhythm, normal heart sounds and intact distal  pulses.   Respiratory: Effort normal and breath sounds normal.  GI: Soft. Bowel sounds are normal.  Genitourinary:       Normal post menopausal changes to external genitalia, vagina pale and smooth, dark blood in vault Cx: no CMT, blood in os Ut: slightly enlarged, mobile NT Adnexa not palpabe RVE confirms  Musculoskeletal: Normal range of motion.  Neurological: She is alert and oriented to person, place, and time.  Skin: Skin is warm and dry.  Psychiatric: She has a normal mood and affect.  Her behavior is normal. Judgment and thought content normal.    No results found for this or any previous visit (from the past 24 hour(s)).  No results found.  Assessment/Plan: Postmenopausal bleeding with hematometria on OTC phytoestrogen. Plan D&C, dx hysteroscopy   Manual Navarra H 05/28/2012, 6:01 PM

## 2012-05-29 ENCOUNTER — Encounter (HOSPITAL_COMMUNITY): Payer: Self-pay | Admitting: Anesthesiology

## 2012-05-29 ENCOUNTER — Encounter (HOSPITAL_COMMUNITY)
Admission: RE | Disposition: A | Discharge status: Home / Self Care | Payer: Self-pay | Source: Ambulatory Visit | Attending: Gynecology

## 2012-05-29 ENCOUNTER — Ambulatory Visit (HOSPITAL_COMMUNITY): Payer: Medicare Other | Admitting: Anesthesiology

## 2012-05-29 ENCOUNTER — Ambulatory Visit (HOSPITAL_COMMUNITY)
Admission: RE | Admit: 2012-05-29 | Discharge: 2012-05-29 | Disposition: A | Discharge status: Home / Self Care | Payer: Medicare Other | Source: Ambulatory Visit | Attending: Gynecology | Admitting: Gynecology

## 2012-05-29 DIAGNOSIS — Z01812 Encounter for preprocedural laboratory examination: Secondary | ICD-10-CM | POA: Insufficient documentation

## 2012-05-29 DIAGNOSIS — N857 Hematometra: Secondary | ICD-10-CM | POA: Insufficient documentation

## 2012-05-29 DIAGNOSIS — C801 Malignant (primary) neoplasm, unspecified: Secondary | ICD-10-CM

## 2012-05-29 DIAGNOSIS — I10 Essential (primary) hypertension: Secondary | ICD-10-CM

## 2012-05-29 DIAGNOSIS — D250 Submucous leiomyoma of uterus: Secondary | ICD-10-CM | POA: Insufficient documentation

## 2012-05-29 DIAGNOSIS — N950 Postmenopausal bleeding: Secondary | ICD-10-CM | POA: Insufficient documentation

## 2012-05-29 DIAGNOSIS — Z01818 Encounter for other preprocedural examination: Secondary | ICD-10-CM | POA: Insufficient documentation

## 2012-05-29 DIAGNOSIS — N840 Polyp of corpus uteri: Secondary | ICD-10-CM | POA: Insufficient documentation

## 2012-05-29 HISTORY — PX: DILATION AND EVACUATION: SHX1459

## 2012-05-29 HISTORY — DX: Malignant (primary) neoplasm, unspecified: C80.1

## 2012-05-29 HISTORY — PX: HYSTEROSCOPY WITH D & C: SHX1775

## 2012-05-29 LAB — REMOTE PACEMAKER DEVICE
AL IMPEDENCE PM: 412 Ohm
AL THRESHOLD: 0.375 V
BATTERY VOLTAGE: 2.79 V
RV LEAD AMPLITUDE: 22.4 mv
RV LEAD IMPEDENCE PM: 593 Ohm

## 2012-05-29 SURGERY — DILATATION AND CURETTAGE /HYSTEROSCOPY
Anesthesia: General | Site: Uterus | Wound class: Clean Contaminated

## 2012-05-29 MED ORDER — BUPIVACAINE HCL (PF) 0.25 % IJ SOLN
INTRAMUSCULAR | Status: AC
  Filled 2012-05-29: qty 30

## 2012-05-29 MED ORDER — LIDOCAINE-EPINEPHRINE 2 %-1:100000 IJ SOLN
INTRAMUSCULAR | Status: AC
  Filled 2012-05-29: qty 1

## 2012-05-29 MED ORDER — FENTANYL CITRATE 0.05 MG/ML IJ SOLN
INTRAMUSCULAR | Status: AC
  Filled 2012-05-29: qty 2

## 2012-05-29 MED ORDER — FENTANYL CITRATE 0.05 MG/ML IJ SOLN
25.0000 ug | INTRAMUSCULAR | Status: DC | PRN
  Administered 2012-05-29 (×2): 50 ug via INTRAVENOUS

## 2012-05-29 MED ORDER — ONDANSETRON HCL 4 MG/2ML IJ SOLN
INTRAMUSCULAR | Status: AC
  Filled 2012-05-29: qty 2

## 2012-05-29 MED ORDER — MIDAZOLAM HCL 2 MG/2ML IJ SOLN
INTRAMUSCULAR | Status: AC
  Filled 2012-05-29: qty 2

## 2012-05-29 MED ORDER — LACTATED RINGERS IV SOLN: INTRAVENOUS | Status: DC

## 2012-05-29 MED ORDER — BUPIVACAINE-EPINEPHRINE PF 0.25-1:200000 % IJ SOLN
INTRAMUSCULAR | Status: AC
  Filled 2012-05-29: qty 30

## 2012-05-29 MED ORDER — LIDOCAINE HCL (CARDIAC) 20 MG/ML IV SOLN
INTRAVENOUS | Status: DC | PRN
  Administered 2012-05-29: 80 mg via INTRAVENOUS

## 2012-05-29 MED ORDER — PROPOFOL 10 MG/ML IV EMUL
INTRAVENOUS | Status: DC | PRN
  Administered 2012-05-29: 150 mg via INTRAVENOUS

## 2012-05-29 MED ORDER — ONDANSETRON HCL 4 MG/2ML IJ SOLN
INTRAMUSCULAR | Status: DC | PRN
  Administered 2012-05-29: 4 mg via INTRAVENOUS

## 2012-05-29 MED ORDER — FENTANYL CITRATE 0.05 MG/ML IJ SOLN
INTRAMUSCULAR | Status: DC | PRN
  Administered 2012-05-29 (×2): 50 ug via INTRAVENOUS

## 2012-05-29 MED ORDER — KETOROLAC TROMETHAMINE 30 MG/ML IJ SOLN
INTRAMUSCULAR | Status: AC
  Filled 2012-05-29: qty 1

## 2012-05-29 MED ORDER — GLYCINE 1.5 % IR SOLN
Status: DC | PRN
  Administered 2012-05-29: 1

## 2012-05-29 MED ORDER — DOXYCYCLINE HYCLATE 100 MG IV SOLR
100.0000 mg | INTRAVENOUS | Status: DC | PRN
  Administered 2012-05-29: 100 mg via INTRAVENOUS

## 2012-05-29 MED ORDER — LIDOCAINE HCL (CARDIAC) 20 MG/ML IV SOLN
INTRAVENOUS | Status: AC
  Filled 2012-05-29: qty 5

## 2012-05-29 MED ORDER — PROPOFOL 10 MG/ML IV EMUL
INTRAVENOUS | Status: AC
  Filled 2012-05-29: qty 20

## 2012-05-29 MED ORDER — DEXAMETHASONE SODIUM PHOSPHATE 10 MG/ML IJ SOLN
INTRAMUSCULAR | Status: AC
  Filled 2012-05-29: qty 1

## 2012-05-29 MED ORDER — BUPIVACAINE HCL (PF) 0.25 % IJ SOLN
INTRAMUSCULAR | Status: DC | PRN
  Administered 2012-05-29: 30 mL

## 2012-05-29 MED ORDER — LACTATED RINGERS IV SOLN
INTRAVENOUS | Status: DC
  Administered 2012-05-29 (×3): via INTRAVENOUS

## 2012-05-29 MED ORDER — DOXYCYCLINE HYCLATE 100 MG IV SOLR
100.0000 mg | Freq: Two times a day (BID) | INTRAVENOUS | Status: DC
  Filled 2012-05-29 (×4): qty 100

## 2012-05-29 MED ORDER — VASOPRESSIN 20 UNIT/ML IJ SOLN
INTRAMUSCULAR | Status: AC
  Filled 2012-05-29: qty 1

## 2012-05-29 MED ORDER — LIDOCAINE-EPINEPHRINE 2 %-1:100000 IJ SOLN
INTRAMUSCULAR | Status: DC | PRN
  Administered 2012-05-29: 20 mL

## 2012-05-29 SURGICAL SUPPLY — 18 items
CATH ROBINSON RED A/P 16FR (CATHETERS) ×3 IMPLANT
CONTAINER PREFILL 10% NBF 60ML (FORM) ×4 IMPLANT
DRESSING TELFA 8X3 (GAUZE/BANDAGES/DRESSINGS) ×3 IMPLANT
ELECT REM PT RETURN 9FT ADLT (ELECTROSURGICAL) ×2
ELECTRODE REM PT RTRN 9FT ADLT (ELECTROSURGICAL) IMPLANT
GLOVE BIO SURGEON STRL SZ7 (GLOVE) ×1 IMPLANT
GLOVE BIOGEL M 6.5 STRL (GLOVE) ×3 IMPLANT
GLOVE BIOGEL PI IND STRL 7.0 (GLOVE) ×1 IMPLANT
GLOVE BIOGEL PI INDICATOR 7.0 (GLOVE) ×1
GOWN STRL REIN XL XLG (GOWN DISPOSABLE) ×4 IMPLANT
KIT BERKELEY 1ST TRIMESTER 3/8 (MISCELLANEOUS) ×1 IMPLANT
LOOP ANGLED CUTTING 22FR (CUTTING LOOP) ×1 IMPLANT
NDL HYPO 21X1.5 SAFETY (NEEDLE) ×1 IMPLANT
NEEDLE HYPO 21X1.5 SAFETY (NEEDLE) ×2 IMPLANT
PACK HYSTEROSCOPY LF (CUSTOM PROCEDURE TRAY) ×2 IMPLANT
PAD OB MATERNITY 4.3X12.25 (PERSONAL CARE ITEMS) ×2 IMPLANT
TOWEL OR 17X24 6PK STRL BLUE (TOWEL DISPOSABLE) ×4 IMPLANT
VACURETTE 6 ASPIR F TIP BERK (CANNULA) ×1 IMPLANT

## 2012-05-29 NOTE — Anesthesia Postprocedure Evaluation (Signed)
Anesthesia Post Note  Patient: Amy Dominguez  Procedure(s) Performed: Procedure(s) (LRB): DILATATION AND CURETTAGE /HYSTEROSCOPY (N/A) DILATATION AND EVACUATION (N/A)  Anesthesia type: GA  Patient location: PACU  Post pain: Pain level controlled  Post assessment: Post-op Vital signs reviewed  Last Vitals:  Filed Vitals:   05/29/12 1230  BP:   Pulse:   Temp: 36.6 C  Resp:     Post vital signs: Reviewed  Level of consciousness: sedated  Complications: No apparent anesthesia complications

## 2012-05-29 NOTE — Brief Op Note (Signed)
05/29/2012  2:48 PM  PATIENT:  Amy Dominguez  76 y.o. female  PRE-OPERATIVE DIAGNOSIS:  Post Menopausal Bleeding, hematometria  POST-OPERATIVE DIAGNOSIS:  Post Menopausal Bleeding, hematometria, endometrial polyps, submucosal fibroid  PROCEDURE:  Procedure(s) (LRB) with comments: DILATATION AND CURETTAGE /HYSTEROSCOPY (N/A) - W/ Resectascope DILATATION AND EVACUATION (N/A) Hysteroscopic myomectomy Polypectomy  SURGEON:  Surgeon(s) and Role:    * Bennye Alm, MD - Primary  PHYSICIAN ASSISTANT:   ASSISTANTS: none   ANESTHESIA:   paracervical block and MAC  EBL:  Total I/O In: 1600 [I.V.:1600] Out: 75 [Urine:50; Blood:25] I/O deficit 3%sorbitol solution: 180cc  BLOOD ADMINISTERED:none  DRAINS: none   LOCAL MEDICATIONS USED:  0.25%MARCAINE   , 2%LIDOCAINE  and Amount: 20ml  SPECIMEN:  Source of Specimen:  uterine leiomyoma, endometrial polyps  DISPOSITION OF SPECIMEN:  PATHOLOGY  COUNTS:  YES  TOURNIQUET:  * No tourniquets in log *  DICTATION: .Other Dictation: Dictation Number [id number]  PLAN OF CARE: Discharge to home after PACU  PATIENT DISPOSITION:  PACU - hemodynamically stable.   Delay start of Pharmacological VTE agent (>24hrs) due to surgical blood loss or risk of bleeding: not applicable

## 2012-05-29 NOTE — H&P (Signed)
No change in  H& P

## 2012-05-29 NOTE — Brief Op Note (Signed)
05/29/2012  12:14 PM  PATIENT:  Amy Dominguez  76 y.o. female  PRE-OPERATIVE DIAGNOSIS:  Post Menopausal Bleeding  POST-OPERATIVE DIAGNOSIS:  Post Menopausal Bleeding  PROCEDURE:  Procedure(s) (LRB) with comments: DILATATION AND CURETTAGE /HYSTEROSCOPY (N/A) - W/ Resectascope DILATATION AND EVACUATION (N/A) Hysteroscopic myomectomy. Polypectomy  SURGEON:  Surgeon(s) and Role:    * Bennye Alm, MD - Primary  PHYSICIAN ASSISTANT:   ASSISTANTS: none   ANESTHESIA:   paracervical block and MAC  EBL:  Total I/O In: 1100 [I.V.:1100] Out: 75 [Urine:50; Blood:25] 3%sorbitol- 180cc  BLOOD ADMINISTERED:none  DRAINS: none   LOCAL MEDICATIONS USED:  0.25% MARCAINE   , 2% LIDOCAINE with epinephrine and Amount: 20 ml  SPECIMEN:  Source of Specimen:  submucosal leiomyoma, endometrial polyp  DISPOSITION OF SPECIMEN:  PATHOLOGY  COUNTS:  YES  TOURNIQUET:  * No tourniquets in log *  DICTATION: .Dragon Dictation  PLAN OF CARE: Discharge to home after PACU  PATIENT DISPOSITION:  PACU - hemodynamically stable.   Delay start of Pharmacological VTE agent (>24hrs) due to surgical blood loss or risk of bleeding: no

## 2012-05-29 NOTE — Transfer of Care (Signed)
Immediate Anesthesia Transfer of Care Note  Patient: Amy Dominguez  Procedure(s) Performed: Procedure(s) (LRB) with comments: DILATATION AND CURETTAGE /HYSTEROSCOPY (N/A) - wants Berkley machine with 6 flex tip available DILATATION AND EVACUATION (N/A)  Patient Location: PACU  Anesthesia Type: General  Level of Consciousness: awake, oriented and patient cooperative  Airway & Oxygen Therapy: Patient Spontanous Breathing and Patient connected to nasal cannula oxygen  Post-op Assessment: Report given to PACU RN and Post -op Vital signs reviewed and stable  Post vital signs: Reviewed and stable  Complications: No apparent anesthesia complications

## 2012-05-29 NOTE — Progress Notes (Signed)
Remote pacer check  

## 2012-05-29 NOTE — Anesthesia Preprocedure Evaluation (Addendum)
Anesthesia Evaluation  Patient identified by MRN, date of birth, ID band Patient awake    Reviewed: Allergy & Precautions, H&P , NPO status , Patient's Chart, lab work & pertinent test results, reviewed documented beta blocker date and time   Airway Mallampati: III TM Distance: >3 FB Neck ROM: full    Dental  (+) Teeth Intact   Pulmonary shortness of breath and with exertion, asthma (episodes following "a bad cold", pt denies history of asthma - hasn't  had in 2 years) , Current Smoker,  breath sounds clear to auscultation  Pulmonary exam normal       Cardiovascular Exercise Tolerance: Good hypertension, On Medications + dysrhythmias (on dabigatran for anticoagulation - stopped on 05/15/12,) Atrial Fibrillation + pacemaker (per patient 100% paced) + Valvular Problems/Murmurs (mild aortic stenosis) AS Rhythm:regular Rate:Normal  Echo 12/12 showed: EF 65-70%, no aortic valvular issues, trivial mitral regurg   Neuro/Psych negative neurological ROS  negative psych ROS   GI/Hepatic negative GI ROS, Neg liver ROS,   Endo/Other  Hypothyroidism   Renal/GU Renal disease (renal insufficiency - Cr 1.0, calculated GFR 54)     Musculoskeletal  (+) Arthritis - (recent lumbar surgery for spinal stenosis - per patient does not need any special positioning), Osteoarthritis,    Abdominal   Peds  Hematology negative hematology ROS (+)   Anesthesia Other Findings   Reproductive/Obstetrics negative OB ROS                       Anesthesia Physical Anesthesia Plan  ASA: III  Anesthesia Plan: MAC   Post-op Pain Management:    Induction:   Airway Management Planned:   Additional Equipment:   Intra-op Plan:   Post-operative Plan:   Informed Consent: I have reviewed the patients History and Physical, chart, labs and discussed the procedure including the risks, benefits and alternatives for the proposed anesthesia  with the patient or authorized representative who has indicated his/her understanding and acceptance.   Dental Advisory Given  Plan Discussed with: CRNA and Surgeon  Anesthesia Plan Comments: (Discussed spinal vs GA with LMA vs MAC/paracervial block.  Patient does not want spinal. [name] prefers paracervical with MAC.  Jasmine December, MD)     Anesthesia Quick Evaluation

## 2012-05-29 NOTE — Preoperative (Signed)
Beta Blockers   Reason not to administer Beta Blockers:Not Applicable 

## 2012-05-30 ENCOUNTER — Encounter (HOSPITAL_COMMUNITY): Payer: Self-pay | Admitting: Gynecology

## 2012-05-30 NOTE — Op Note (Signed)
NAMEKIMBLERY, DIOP             ACCOUNT NO.:  [id number]  MEDICAL RECORD NO.:  [id number]  LOCATION:  [facility]                          FACILITY:  [facility]  PHYSICIAN:  Ivor Costa. Farrel Gobble, M.D. DATE OF BIRTH:  1935-09-13  DATE OF PROCEDURE:  05/29/2012 DATE OF DISCHARGE:  05/29/2012                              OPERATIVE REPORT   PREOPERATIVE DIAGNOSIS:  Postmenopausal bleeding with hematometra.  POSTOPERATIVE DIAGNOSIS:  Postmenopausal bleeding with hematometra plus endometrial polyp since submucosal fibroid.  PROCEDURE: 1. Suction curettage. 2. Hysteroscopic myomectomy. 3. Hysteroscopic polypectomy.  SURGEON:  Ivor Costa. Hooper Petteway, MD.  ANESTHESIA:  MAC with the paracervical block of 20 mL with equal parts 2% lidocaine with 0.25% Marcaine.  IV FLUIDS:  1600 mL lactated Ringer's out.  Urine out was 25 mL.  I and O deficit of 3% sorbitol solution with 180 mL.  FINDINGS:  A large submucosal fibroid that occupied the vast majority of the cavity, obstructing the view of the normal cavity architecture. After the majority of the resection was obtained, two endometrial polyps were visualized on the lateral wall.  The fibroid was on the posterior wall.    Pathology: leiomyoma and a separate specimen of endometrial polyp.  PROCEDURE:  The patient was taken to the operating room.  MAC anesthesia was inducted and placed in the dorsal lithotomy position, prepped and draped in the usual sterile fashion.  Bimanual exam was performed confirming the orientation of the uterus.  Bivalve spectrum was placed in the vagina.  The cervix was visualized and stabilized with a single- tooth tenaculum.  The paracervical block was placed. The patient taken Cytotec evening before in the morning to soften her cervix.  The uterine sound was advanced; however, only to 5 mm.  The cervix was noted to be slightly dilated and was gently dilated up to 23-French after which the diagnostic hysteroscope was advanced to the  cervix.  It was obvious that the vast majority of the cavity was consumed with this posterolateral fibroid that was on the patient's left hand side.  The scope was unable to be passed completely cephalad anterior to the fibroid and there was minimal distention to allow [place] to visualize the right side wall.  The patient's cervix was then dilated up to 31-French after which the operative hysteroscope, resectoscope was advanced through the cervix. With visualization, we slowly started to dissect down the bulk of the fibroid and removed pieces accordingly to aid in visualization. With the vast majority of the fibroid was removed,  we are still unable to visualized the ostia; however, we did see 2 polypoid appearing masses on the right lateral wall.  These were sharply taken down again with the resectoscope and sent as a separate specimen.  The base of the leiomyoma was treated with appropriate with cautery.  It was noted to be hemostatic throughout the case.  The instruments were then removed.  The cervix was noted to be hemostatic.  The patient was extubated in the OR and transferred to the PACU in stable condition.  The patient had received IV doxycycline preoperatively as antibiotic prophylaxis.     Ivor Costa. Farrel Gobble, M.D.     THL/MEDQ  D:  05/29/2012  T:  05/30/2012  Job:  [id number]

## 2012-06-08 ENCOUNTER — Telehealth: Payer: Self-pay

## 2012-06-08 NOTE — Telephone Encounter (Signed)
Pt filling out some paper work wanted to know what immunizations she has had and when. I advised her TD 09/21/2015, pneumonia 2007.       MW

## 2012-06-11 ENCOUNTER — Other Ambulatory Visit: Payer: Self-pay | Admitting: Gynecologic Oncology

## 2012-06-11 DIAGNOSIS — C499 Malignant neoplasm of connective and soft tissue, unspecified: Secondary | ICD-10-CM

## 2012-06-12 ENCOUNTER — Ambulatory Visit (HOSPITAL_COMMUNITY)
Admission: RE | Admit: 2012-06-12 | Discharge: 2012-06-12 | Disposition: A | Discharge status: Home / Self Care | Payer: Medicare Other | Source: Ambulatory Visit | Attending: Gynecologic Oncology | Admitting: Gynecologic Oncology

## 2012-06-12 ENCOUNTER — Encounter: Payer: Self-pay | Admitting: *Deleted

## 2012-06-12 DIAGNOSIS — J984 Other disorders of lung: Secondary | ICD-10-CM | POA: Insufficient documentation

## 2012-06-12 DIAGNOSIS — C55 Malignant neoplasm of uterus, part unspecified: Secondary | ICD-10-CM | POA: Insufficient documentation

## 2012-06-12 DIAGNOSIS — Q619 Cystic kidney disease, unspecified: Secondary | ICD-10-CM | POA: Insufficient documentation

## 2012-06-12 DIAGNOSIS — C499 Malignant neoplasm of connective and soft tissue, unspecified: Secondary | ICD-10-CM

## 2012-06-12 MED ORDER — IOHEXOL 300 MG/ML  SOLN
100.0000 mL | Freq: Once | INTRAMUSCULAR | Status: AC | PRN
  Administered 2012-06-12: 100 mL via INTRAVENOUS

## 2012-06-13 ENCOUNTER — Ambulatory Visit: Payer: Medicare Other | Admitting: Gynecology

## 2012-07-04 ENCOUNTER — Telehealth: Payer: Self-pay | Admitting: Oncology

## 2012-07-04 NOTE — Telephone Encounter (Signed)
C/D 07/04/12 for appt. 07/17/12

## 2012-07-04 NOTE — Telephone Encounter (Signed)
Schedule per Telford Nab.  Welcome packet mailed.

## 2012-07-12 ENCOUNTER — Ambulatory Visit: Payer: Medicare Other | Attending: Gynecologic Oncology | Admitting: Gynecologic Oncology

## 2012-07-12 ENCOUNTER — Encounter: Payer: Self-pay | Admitting: Gynecologic Oncology

## 2012-07-12 VITALS — BP 112/72 | HR 74 | Temp 98.1°F | Resp 18 | Ht 61.77 in | Wt 183.7 lb

## 2012-07-12 DIAGNOSIS — Z803 Family history of malignant neoplasm of breast: Secondary | ICD-10-CM | POA: Insufficient documentation

## 2012-07-12 DIAGNOSIS — Z888 Allergy status to other drugs, medicaments and biological substances status: Secondary | ICD-10-CM | POA: Insufficient documentation

## 2012-07-12 DIAGNOSIS — I10 Essential (primary) hypertension: Secondary | ICD-10-CM | POA: Insufficient documentation

## 2012-07-12 DIAGNOSIS — C573 Malignant neoplasm of parametrium: Secondary | ICD-10-CM | POA: Insufficient documentation

## 2012-07-12 DIAGNOSIS — Z8489 Family history of other specified conditions: Secondary | ICD-10-CM | POA: Insufficient documentation

## 2012-07-12 DIAGNOSIS — Z823 Family history of stroke: Secondary | ICD-10-CM | POA: Insufficient documentation

## 2012-07-12 DIAGNOSIS — J45909 Unspecified asthma, uncomplicated: Secondary | ICD-10-CM | POA: Insufficient documentation

## 2012-07-12 DIAGNOSIS — Z09 Encounter for follow-up examination after completed treatment for conditions other than malignant neoplasm: Secondary | ICD-10-CM | POA: Insufficient documentation

## 2012-07-12 DIAGNOSIS — Z8249 Family history of ischemic heart disease and other diseases of the circulatory system: Secondary | ICD-10-CM | POA: Insufficient documentation

## 2012-07-12 DIAGNOSIS — C55 Malignant neoplasm of uterus, part unspecified: Secondary | ICD-10-CM

## 2012-07-12 NOTE — Patient Instructions (Signed)
Follow up with Dr. Livesay as scheduled. 

## 2012-07-12 NOTE — Progress Notes (Signed)
Consult Note: Gyn-Onc  Amy Dominguez 76 y.o. female  CC:  Chief Complaint  Patient presents with  . Leiomyosarcoma    Follow up    HPI: HISTORY OF PRESENT ILLNESS: 76 year old female who presented with postmenopausal bleeding. Her workup included an ultrasound that showed a fibroid within the endometrial cavity and two small polyps, the results of the ultrasound are not available to [place]; however, after the ultrasound, the patient underwent a dilation and curettage with hysteroscopic myomectomy and polypectomy. The pathology returned with a high-grade leiomyosarcoma. The pathology has been reviewed here at [facility] and the diagnosis was confirmed. Today, the patient reports she has not had any vaginal bleeding other than occasional spotting immediately after the procedure.   Procedure: Robotic assisted laparoscopic hysterectomy, bilateral salpingo-oophorectomy 06/15/12  Operative findings: Physiologic adhesions of right adnexum to appendix; small fibroid noted on uterus, otherwise normal abdominal survey; no evidence of extrauterine disease on evaluation.   Pathology: [id number]   A: Uterus, hysterectomy and bilateral salpingo-oophorectomy  Histologic type: Leiomyosarcoma  Histologic grade: High grade (tumor necrosis, significant diffuse cytologic atypia, and increased mitotic activity, >20 mitoses/10 high power fields, present)  Tumor site: Uterine myometrium  Tumor size: 3.5 cm  Myometrial invasion: Outer half  Depth: approximately 1.8 cm Wall thickness: 2.1 cm Percent: 86%  Serosal involvement: Absent  Lower uterine segment involvement: Absent  Cervical involvement: Absent  Adnexal involvement: Absent  Other involved sites: N/A  Cervical/vaginal margin and distance: Widely free (3.5 cm away)  Lymphovascular space invasion: Absent  Additional pathologic findings:  - Endometrium with atrophic endometrium  - Myometrium with leiomyoma, 0.6 cm  - Vascular calcifications  Right ovary:  Atrophic ovary, no tumor seen  Left ovary: Atrophic ovary, no tumor seen  Right fallopian tube: No tumor seen  Left fallopian tube: No tumor seen    Review of Systems: She is doing well postoperatively. She has some constipation for about a week and in that cleared up. She had no pain whatsoever never took any pain medication. She had no bleeding from a we can have his had some spotting since that time. She is very pleased with how she has done postoperatively. She is scheduled for chemotherapy class on November 18 and an appointment Dr. Darrold Span on November 19  Current Meds:  Outpatient Encounter Prescriptions as of 07/12/2012  Medication Sig Dispense Refill  . Calcium Carbonate-Vitamin D (CALCIUM 600 + D PO) Take 2 tablets by mouth 2 (two) times daily.        . cetirizine (ZYRTEC) 10 MG tablet Take 10 mg by mouth daily.        . Dabigatran Etexilate Mesylate (PRADAXA PO) Take 250 mg by mouth 2 (two) times daily.      Marland Kitchen lisinopril (PRINIVIL,ZESTRIL) 20 MG tablet Take 20 mg by mouth daily.        . Multiple Vitamins-Minerals (MULTIVITAMINS THER. W/MINERALS) TABS Take 1 tablet by mouth daily.        . potassium chloride SA (K-DUR,KLOR-CON) 20 MEQ tablet Take 1 tablet (20 mEq total) by mouth daily.  90 tablet  3  . torsemide (DEMADEX) 20 MG tablet Take 1 tablet (20 mg total) by mouth daily.  30 tablet  12  . fluticasone (FLONASE) 50 MCG/ACT nasal spray Place 1 spray into the nose as needed.      . S-Adenosylmethionine (SAM-E) 400 MG TABS Take by mouth daily.       Marland Kitchen VITAMIN E PO Take 2 tablets by mouth daily.  Allergy:  Allergies  Allergen Reactions  . Tylenol (Acetaminophen) Other (See Comments)    Brand name tylenol causes headache    Social Hx:   History   Social History  . Marital Status: Married    Spouse Name: N/A    Number of Children: N/A  . Years of Education: N/A   Occupational History  . Retired Charity fundraiser    Social History Main Topics  . Smoking status: Current  Some Day Smoker -- 0.2 packs/day for 54 years    Types: Cigarettes  . Smokeless tobacco: Not on file  . Alcohol Use: 1.8 oz/week    3 Glasses of wine per week     Comment:  occasionally  . Drug Use: No  . Sexually Active: Not on file   Other Topics Concern  . Not on file   Social History Narrative   No added salt    Past Surgical Hx:  Past Surgical History  Procedure Date  . Polypectomy 2008     negative 2011,Dr Jarold Motto  . Hemorrhoid surgery   . Tonsillectomy   . Tubal ligation   . Insert / replace / remove pacemaker DR [facility] pacemaker  09/2009  . Foot surgery     JOINT REPLACEMENT LT GREAT TOE   20 YRS AGO   . Cataract extraction     OS  . Lumbar laminectomy/decompression microdiscectomy 09/21/2011    Procedure: LUMBAR LAMINECTOMY/DECOMPRESSION MICRODISCECTOMY;  Surgeon: Tia Alert, MD;  Location: [facility];  Service: Neurosurgery;  Laterality: N/A;  lumbar laminectomy lumbar four-lumbar five  . Hysteroscopy w/d&c 05/29/2012    Procedure: DILATATION AND CURETTAGE /HYSTEROSCOPY;  Surgeon: Bennye Alm, MD;  Location: [facility];  Service: Gynecology;  Laterality: N/A;  W/ Resectascope  . Dilation and evacuation 05/29/2012    Procedure: DILATATION AND EVACUATION;  Surgeon: Bennye Alm, MD;  Location: [facility];  Service: Gynecology;  Laterality: N/A;    Past Medical Hx:  Past Medical History  Diagnosis Date  . Arthralgia   . Spinal stenosis     spinal stenosis LS spine  . Hypertension   . Asthma 2008 , 2012    RAD after BRONCHITIS    . Arthritis     hands, feet, hips, knees  . Headache     allergy related  . Complication of anesthesia     slow to respond  . Dysrhythmia     atrial fib-Medtronic Pacemaker-on pradaxa-off for surgery since 9.25 2013  . Symptomatic bradycardia 2011    Insertion of Medtronic Pacer    Family Hx:  Family History  Problem Relation Age of Onset  . Stroke Mother     post valve replacement; warfarin missed  . Heart  attack Father 36  . Breast cancer Sister   . Deep vein thrombosis Daughter     Factor S non functional    Vitals:  Blood pressure 112/72, pulse 74, temperature 98.1 F (36.7 C), temperature source Oral, resp. rate 18, height 5' 1.77" (1.569 m), weight 183 lb 11.2 oz (83.326 kg), last menstrual period 09/14/2011.  Physical Exam: Well-nourished well-developed female in no acute distress.  Abdomen: Well-healed surgical incisions. Abdomen is soft and nontender.  Pelvic: Normal external female genitalia. Vagina is atrophic. Vaginal cuff is visualized. Healing well suture line is intact. There is scant bleeding. Bimanual examination was no masses or nodularity.  Extremities: No edema.  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 Disposition to chemotherapy with  gemcitabine/taxotere. She is scheduled to see Dr. Darrold Span would like to start her treatment after the Thanksgiving holiday. She may require 2 weeks off between her first and second cycles to accommodate her Christmas vacation plans. She knows I will be following up with her in seeing her for followup when she completes her adjuvant chemotherapy.  Amy Dominguez A., MD 07/12/2012, 4:19 PM

## 2012-07-16 ENCOUNTER — Other Ambulatory Visit: Payer: Self-pay | Admitting: Oncology

## 2012-07-16 ENCOUNTER — Other Ambulatory Visit: Payer: Medicare Other

## 2012-07-16 ENCOUNTER — Encounter: Payer: Self-pay | Admitting: Oncology

## 2012-07-16 NOTE — Progress Notes (Signed)
Checked in for Chemo education class.

## 2012-07-17 ENCOUNTER — Other Ambulatory Visit: Payer: Self-pay

## 2012-07-17 ENCOUNTER — Ambulatory Visit (HOSPITAL_BASED_OUTPATIENT_CLINIC_OR_DEPARTMENT_OTHER): Payer: Medicare Other

## 2012-07-17 ENCOUNTER — Telehealth: Payer: Self-pay | Admitting: Oncology

## 2012-07-17 ENCOUNTER — Telehealth: Payer: Self-pay | Admitting: *Deleted

## 2012-07-17 ENCOUNTER — Other Ambulatory Visit (HOSPITAL_BASED_OUTPATIENT_CLINIC_OR_DEPARTMENT_OTHER): Payer: Medicare Other | Admitting: Lab

## 2012-07-17 ENCOUNTER — Encounter: Payer: Self-pay | Admitting: Oncology

## 2012-07-17 ENCOUNTER — Ambulatory Visit (HOSPITAL_BASED_OUTPATIENT_CLINIC_OR_DEPARTMENT_OTHER): Payer: Medicare Other | Admitting: Oncology

## 2012-07-17 VITALS — BP 129/58 | HR 93 | Temp 97.4°F | Resp 20 | Ht 61.77 in | Wt 183.1 lb

## 2012-07-17 DIAGNOSIS — C55 Malignant neoplasm of uterus, part unspecified: Secondary | ICD-10-CM

## 2012-07-17 LAB — COMPREHENSIVE METABOLIC PANEL (CC13)
ALT: 7 U/L (ref 0–55)
AST: 19 U/L (ref 5–34)
Alkaline Phosphatase: 85 U/L (ref 40–150)
CO2: 29 mEq/L (ref 22–29)
Sodium: 142 mEq/L (ref 136–145)
Total Bilirubin: 0.83 mg/dL (ref 0.20–1.20)
Total Protein: 7 g/dL (ref 6.4–8.3)

## 2012-07-17 LAB — CBC WITH DIFFERENTIAL/PLATELET
BASO%: 0.8 % (ref 0.0–2.0)
LYMPH%: 24.6 % (ref 14.0–49.7)
MCHC: 33.6 g/dL (ref 31.5–36.0)
MONO#: 0.7 10*3/uL (ref 0.1–0.9)
MONO%: 7.9 % (ref 0.0–14.0)
Platelets: 190 10*3/uL (ref 145–400)
RBC: 4.38 10*6/uL (ref 3.70–5.45)
WBC: 9 10*3/uL (ref 3.9–10.3)

## 2012-07-17 MED ORDER — ONDANSETRON HCL 8 MG PO TABS: 8.0000 mg | ORAL_TABLET | Freq: Three times a day (TID) | ORAL | Status: DC | PRN

## 2012-07-17 MED ORDER — DEXAMETHASONE 4 MG PO TABS: ORAL_TABLET | ORAL | Status: DC

## 2012-07-17 MED ORDER — LORAZEPAM 0.5 MG PO TABS: ORAL_TABLET | ORAL | Status: DC

## 2012-07-17 MED ORDER — LIDOCAINE-PRILOCAINE 2.5-2.5 % EX CREA: TOPICAL_CREAM | CUTANEOUS | Status: DC | PRN

## 2012-07-17 NOTE — Telephone Encounter (Signed)
appts made and printed for pt pt aware that i will call her with the 12/16 new time as she req after 12:00,email to mw to adjust

## 2012-07-17 NOTE — Progress Notes (Signed)
Checked in new patient. No financial issues. °

## 2012-07-17 NOTE — Patient Instructions (Signed)
We will send prescriptions to [facility]:    Zofran (ondansetron) 8 mg  One - two tablets every 12 hrs as needed for nausea. Will not make you drowsy  Ativan (lorazepam) 0.5 mg   One half tab (= 0.25 mg) sublingual or swallow every 6 hrs as needed for nausea  Decadron (dexamethasone) 4 mg    Two tablets with food AM and ~ 4 PM daily x 3 days beginning day prior to day 8 taxotere chemo.  EMLA / Ellamax cream to numb portacath. Apply ~ 30 - 60 min before access and cover with small square of Saran wrap   [phone] if any questions or concerns

## 2012-07-17 NOTE — Progress Notes (Signed)
[facility] told that patient will pick up medication after Dec. 3.

## 2012-07-17 NOTE — Telephone Encounter (Signed)
Per staff message I have scheduled appts. JMW  

## 2012-07-17 NOTE — Consult Note (Signed)
[facility] NEW PATIENT EVALUATION   Name: Amy Dominguez Date: 07/17/2012 MRN: [id number] DOB: Apr 05, 1936  REFERRING PHYSICIAN:  Cleda Mccreedy Other Physicians: [name] (PCP), [name] / [name] (gyn), [name] / [name] (ortho), [name], [name] (derm), [name] (podiatry), [name] (GI), [name]    REASON FOR REFERRAL: consideration of adjuvant chemotherapy for uterine leiomyosarcoma     HISTORY OF PRESENT ILLNESS:Amy Dominguez is a 76 y.o. 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  Patient had new onset vaginal spotting in late Sept/ early Oct 2013, initially thought related to Pradaxa and use of OTC estrogen for hot flashes during summer. She was seen at Dr [name] office within 2-3 days, with ultrasound done (report not in this EMR) then [facility] with hysteroscopic myomectomy and polypectomy by [name] on 05-29-12, path [id number] with high grade leiomyosarcoma with >10 mitoses/ 10 HPF, 3.5 cm into outer half of myometrium, no LVSI. This pathology was also reviewed at [facility], findings confirmed. She was referred to [name], had CT CAP done in [facility] system 06-12-12 with 2 indeterminate left lung nodules largest 6 mm and 3 cm posterior uterine myometrial mass without apparent extrauterine extension or metastatic disease. She went to robotic assisted laparoscopic hysterectomy, bilateral salpingo-oophorectomy 06/15/12 at [facility] by [name] with findings of physiologic adhesions of right adnexum to appendix; small fibroid noted on uterus, otherwise normal abdominal survey; no evidence of extrauterine disease on evaluation. Pathology from the [facility] surgery [phone]) had high grade leiomyosarcoma with > 20 mitoses/10 HPF and invasion into outer half of myometrium, negative LVSI and  benign ovaries/tubes. She had no difficulty with the surgeries and has been recovering well since then. She saw [name] in follow up on 07-12-12, with recommendation for gemzar/taxotere (probably 4 cycles, tho this is not stated in Dr Denman George note). Only available study randomizes to this treatment vs observation, thus recommendation that treatment be off study. Patient has attended chemotherapy teaching class prior to my visit. She would prefer to begin treatment just after she returns from planned Thanksgiving holiday with family out of state. Patient requests chemotherapy be done in [place].      REVIEW OF SYSTEMS: some intentional weight loss early fall, usual weight 184-187. Sinus HA, uses zyrtec daily. Good visual acuity with contacts. No difficulty with hearing. Has dental implants and regular dental cleanings/ exams. Has had mammograms at [facility], remotely had cysts/ none recently. No cardiac symtoms. Denies shortness of breath or cough. No thyroid symtptoms. No GERD. Did have constipation after surgery, helped with colace and dulcolax, bowels now moving normally. No bladder symptoms. Eats good balanced diet. Minimal vaginal spotting now, no other bleeding or excessive bruising on Pradaxa. IV access is difficult and she would like PAC by IR. No peripheral neuropathy symptoms. No LE swelling or cords. Chronic degenerative problems in knees and feet  ALLERGIES: Tylenol  PAST MEDICAL HISTORY:  has a past medical history of Arthralgia; Spinal stenosis; Hypertension; Asthma (2008 , 2012); Arthritis; Headache; Complication of anesthesia; Dysrhythmia; and Symptomatic bradycardia (2011).   HTN x 2-3 years afib on Pradaxa x 1.5 years (diagnosed after pacer placed) Pacer 2011 for asymptomatic bradycardia with rates in 40s G5P4 BTL remotely Hemorrhoid procedure by GI Surgery for spinal stenosis L4-03 Sep 2011 Borderline hypothyroid not on medication, followed by PCP M[facility]05-[facility]  CURRENT MEDICATIONS: reviewed as listed in EMR. Prescriptions to be sent for decadron 8 mg bid x 3 days beginning day prior to taxotere, ativan and zofran. Note patient reports that she is usually sensitive to medications and does better with lower doses.    SOCIAL HISTORY:  reports that she has been smoking Cigarettes.  She has a 13.5 pack-year smoking history. She does not have any smokeless tobacco history on file. She reports that she drinks about 1.8 ounces of alcohol per week. She reports that she does not use illicit drugs. Patient reports that she presently smokes 2-3 cigarettes/day; she has smoked <= 0.5 ppd since in her 29s, did stop entirely x 10 years previously.  She is originally from [place]/ [place], has been in [place] since 1998 with husband's work with [employer]. 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 She and husband are both retired now. One son Jonny Ruiz age 72 works in Aeronautical engineer (?), lives in [place], 5 children including oldest son age 75 with traumatic brain injury and meningitis 3 years ago; three daughters Dixon Boos and teaches business in [place], Jearld [facility] pediatric oncologist with 2 sons, Nicki [place] attorney in [place]with twins.   FAMILY HISTORY: family history includes Breast cancer in her sister; Deep vein thrombosis in her daughter; Heart attack (age of onset:70) in her father; and Stroke in her mother.Father had infection related to knee replacement and died with related cardiac problem. Mother had rheumatic heart disease and died of complications of valvular endocarditis following dental problem.   Sister breast cancer age 93, brother healthy. Children and grandchildren healthy other than grandson noted above.  LABORATORY DATA:  Results for orders placed in visit on 07/17/12 (from the past 48 hour(s))  CBC WITH DIFFERENTIAL     Status: Normal   Collection Time   07/17/12   9:58 AM      Component Value Range Comment   WBC 9.0  3.9 - 10.3 10e3/uL    NEUT# 5.9  1.5 - 6.5 10e3/uL    HGB 12.8  11.6 - 15.9 g/dL    HCT 16.1  09.6 - 04.5 %    Platelets 190  145 - 400 10e3/uL    MCV 87.2  79.5 - 101.0 fL    MCH 29.3  25.1 - 34.0 pg    MCHC 33.6  31.5 - 36.0 g/dL    RBC 4.09  8.11 - 9.14 10e6/uL    RDW 14.2  11.2 - 14.5 %    lymph# 2.2  0.9 - 3.3 10e3/uL    MONO# 0.7  0.1 - 0.9 10e3/uL    Eosinophils Absolute 0.1  0.0 - 0.5 10e3/uL    Basophils Absolute 0.1  0.0 - 0.1 10e3/uL    NEUT% 65.2  38.4 - 76.8 %    LYMPH% 24.6  14.0 - 49.7 %    MONO% 7.9  0.0 - 14.0 %    EOS% 1.5  0.0 - 7.0 %    BASO% 0.8  0.0 - 2.0 %   COMPREHENSIVE METABOLIC PANEL (CC13)     Status: Abnormal   Collection Time   07/17/12  9:58 AM      Component Value Range Comment   Sodium 142  136 - 145 mEq/L    Potassium 4.3  3.5 - 5.1 mEq/L    Chloride 102  98 - 107 mEq/L  CO2 29  22 - 29 mEq/L    Glucose 148 (*) 70 - 99 mg/dl    BUN 24.4  7.0 - 01.0 mg/dL    Creatinine 1.1  0.6 - 1.1 mg/dL    Total Bilirubin 2.72  0.20 - 1.20 mg/dL    Alkaline Phosphatase 85  40 - 150 U/L    AST 19  5 - 34 U/L    ALT 7  0 - 55 U/L    Total Protein 7.0  6.4 - 8.3 g/dL    Albumin 3.9  3.5 - 5.0 g/dL    Calcium 9.7  8.4 - 53.6 mg/dL        RADIOGRAPHY: No results found.     sult     CT CAP 06-12-12 Comparison: None.  CT CHEST  Findings: Too small left lung nodules are seen in the left lung  apex and lower lobe which are noncalcified. Largest in the left  lower lobe measures 6 mm on image 41. No right lung nodules  identified.  No evidence of pulmonary infiltrate or central endobronchial  lesion. No evidence of pleural or pericardial effusion. No  evidence of hilar or mediastinal masses. No lymphadenopathy seen  within the thorax. No suspicious bone lesions identified.  IMPRESSION:  1. Two indeterminate left lung nodules, largest measuring 6 mm in  the left lower lobe. Follow-up by chest  CT is recommended in 6  months.  2. No other signs of metastatic disease identified within the  thorax peri  CT ABDOMEN AND PELVIS  Findings: A heterogeneous mass is seen in the posterior uterine  myometrium measuring 3.0 cm in diameter, which is consistent with  given history of leiomyosarcoma. There is no evidence of  extrauterine extension, or involvement of the adnexa. No evidence  of pelvic sidewall involvement or pelvic lymphadenopathy. Adnexa  are unremarkable in appearance.  A small renal cysts are seen bilaterally but there is no evidence  of renal mass or hydronephrosis. No evidence of retroperitoneal or  abdominal lymphadenopathy. The other abdominal parenchymal organs  are normal appearance. Gallbladder is unremarkable. No evidence  of hernia or abdominal wall mass. No suspicious bone lesions are  identified.  IMPRESSION:  1. 3 cm posterior uterine myometrial mass, consistent with given  history of leiomyosarcoma.  2. No evidence of extrauterine extension or distant metastatic  disease.    PATHOLOGY  [id number] Collected Date: 05/29/2012 Received Date: 05/29/2012 Physician: [name], MD 7 REPORT OF SURGICAL PATHOLOGY FINAL DIAGNOSIS Diagnosis 1. Endometrium, curettage, uterine contents/hemateria - BLOOD AND RARE BENIGN GLANDULAR FRAGMENTS. 2. Uterine fibroid(s) - HIGH GRADE LEIOMYOSARCOMA. 3. Endometrial polyp - HIGH GRADE LEIOMYOSARCOMA AND BENIGN ENDOMETRIAL POLYP. Microscopic Comment 2. There are multiple fragments of spindle cell malignancy characterized by marked cytologic atypia, high mitotic grade (greater than 10 per 10 high power fields), and high cellularity. Immunohistochemistry is performed and the malignant cells are positive with smooth muscle actin, muscle specific actin, and show focal positivity with desmin. The findings are consistent with high grade leiomyosarcoma. 3. There is a fragment with malignant features similar to part 2,  consistent with high grade leiomyosarcoma. There are also several fragments of benign endometrial polyp. Dr. Laureen Ochs agrees. Case discussed with Dr. Farrel Gobble on 05/30/12. (JDP:eps 05/31/12) Jimmy Picket MD     Pathology: [phone] from [facility] 06-15-12  A: Uterus, hysterectomy and bilateral salpingo-oophorectomy  Histologic type: Leiomyosarcoma  Histologic grade: High grade (tumor necrosis, significant diffuse cytologic atypia, and increased mitotic activity, >20 mitoses/10 high power  fields, present)  Tumor site: Uterine myometrium  Tumor size: 3.5 cm  Myometrial invasion: Outer half  Depth: approximately 1.8 cm Wall thickness: 2.1 cm Percent: 86%  Serosal involvement: Absent  Lower uterine segment involvement: Absent  Cervical involvement: Absent  Adnexal involvement: Absent  Other involved sites: N/A  Cervical/vaginal margin and distance: Widely free (3.5 cm away)  Lymphovascular space invasion: Absent  Additional pathologic findings:  - Endometrium with atrophic endometrium  - Myometrium with leiomyoma, 0.6 cm  - Vascular calcifications  Right ovary: Atrophic ovary, no tumor seen  Left ovary: Atrophic ovary, no tumor seen  Right fallopian tube: No tumor seen  Left fallopian tube: No tumor seen    PHYSICAL EXAM:  height is 5' 1.77" (1.569 m) and weight is 183 lb 1.6 oz (83.054 kg). Her oral temperature is 97.4 F (36.3 C). Her blood pressure is 129/58 and her pulse is 93. Her respiration is 20.   Delightful lady looks younger than stated age, excellent historian, husband very supportive. Ambulatory and mobile in exam room without difficulty, appears comfortable.  HEENT: normal hair pattern. PERRL, not icteric.Oral mucosa and posterior pharynx clear and moist. Neck supple without obvious thyroid mass, no JVD. Lymphatics: no palpable cervical, supraclavicular, axillary or inguinal adenopathy.  Lungs clear to auscultation/ percussion Heart irregularly irregular at 80 Breasts  bilaterally without dominant mass, skin or nipple findings of concern. Abdomen: laparoscopic incisions closed, nontender, minimal expected erythema just along surgical incisions. Soft, nontender, normal bowel sounds, no appreciable HSM or mass. LE without edema, cords, tenderness.  Neuro: CN, motor, sensory nonfocal  We have discussed all of history above. We have discussed recommendation for adjuvant chemotherapy, tho she is aware that this treatment for stage I disease is recommended based on efficacy in more advanced uterine leiomyosarcoma as studies are still in process with early stage disease. She has had questions answered to her satisfaction from the chemotherapy education class. We have discussed antiemetics, steroids around taxotere, her "allergy to Tylenol" and good tolerance of acetaminophen, scheduling around holidays, the [facility] and how to contact this office at any time. She and husband are comfortable with discussion and plan. She will have day 1 cycle 1 on 08-06-12 and I will see her in infusion that day. She will see me also on 12-14 and have day 8 cycle 1 on 08-13-12 with neulasta on 08-14-12.   IMPRESSION / PLAN:  1. Stage IA high grade uterine leiomyosarcoma: post surgery 06-15-12 and for adjuvant chemotherapy with gemzar/taxotere as above. 2.PAC to be placed by IR, necessary with gemzar 3. Ongoing tobacco, which I will try to address further 4.HTN, afib, cardiac pacer 5.chronic knee problems 6.reported intolerance to name brand Tylenol; tolerates generic acetaminophen.  Patient and husband were comfortable with discussion and in agreement with plan. She understands that she can call at any time if further questions or concerns.  LIVESAY,LENNIS P, MD 07/17/2012 1:59 PM

## 2012-07-22 ENCOUNTER — Other Ambulatory Visit: Payer: Self-pay | Admitting: Oncology

## 2012-07-22 DIAGNOSIS — Z8542 Personal history of malignant neoplasm of other parts of uterus: Secondary | ICD-10-CM | POA: Insufficient documentation

## 2012-07-22 NOTE — Progress Notes (Signed)
See "Consult Note" this date. Amy Mcgill, MD

## 2012-07-23 ENCOUNTER — Telehealth: Payer: Self-pay | Admitting: Oncology

## 2012-07-23 NOTE — Telephone Encounter (Signed)
l/m with appt info and advised pt to get a sch on 12/9 appt

## 2012-07-27 ENCOUNTER — Other Ambulatory Visit: Payer: Self-pay | Admitting: Radiology

## 2012-08-01 ENCOUNTER — Encounter (HOSPITAL_COMMUNITY): Payer: Self-pay | Admitting: Pharmacy Technician

## 2012-08-01 ENCOUNTER — Encounter (HOSPITAL_COMMUNITY): Payer: Self-pay

## 2012-08-01 ENCOUNTER — Other Ambulatory Visit: Payer: Self-pay | Admitting: Oncology

## 2012-08-01 ENCOUNTER — Ambulatory Visit (HOSPITAL_COMMUNITY)
Admission: RE | Admit: 2012-08-01 | Discharge: 2012-08-01 | Disposition: A | Discharge status: Home / Self Care | Payer: Medicare Other | Source: Ambulatory Visit | Attending: Oncology | Admitting: Oncology

## 2012-08-01 DIAGNOSIS — I4891 Unspecified atrial fibrillation: Secondary | ICD-10-CM | POA: Insufficient documentation

## 2012-08-01 DIAGNOSIS — C55 Malignant neoplasm of uterus, part unspecified: Secondary | ICD-10-CM

## 2012-08-01 DIAGNOSIS — Z79899 Other long term (current) drug therapy: Secondary | ICD-10-CM | POA: Insufficient documentation

## 2012-08-01 DIAGNOSIS — I10 Essential (primary) hypertension: Secondary | ICD-10-CM | POA: Insufficient documentation

## 2012-08-01 DIAGNOSIS — Z950 Presence of cardiac pacemaker: Secondary | ICD-10-CM | POA: Insufficient documentation

## 2012-08-01 LAB — CBC WITH DIFFERENTIAL/PLATELET
Hemoglobin: 12.5 g/dL (ref 12.0–15.0)
Lymphocytes Relative: 28 % (ref 12–46)
Lymphs Abs: 2.4 10*3/uL (ref 0.7–4.0)
Monocytes Relative: 9 % (ref 3–12)
Neutro Abs: 5.2 10*3/uL (ref 1.7–7.7)
Neutrophils Relative %: 61 % (ref 43–77)
RBC: 4.38 MIL/uL (ref 3.87–5.11)
WBC: 8.5 10*3/uL (ref 4.0–10.5)

## 2012-08-01 MED ORDER — MIDAZOLAM HCL 2 MG/2ML IJ SOLN
INTRAMUSCULAR | Status: AC | PRN
  Administered 2012-08-01 (×2): 1 mg via INTRAVENOUS

## 2012-08-01 MED ORDER — MIDAZOLAM HCL 5 MG/5ML IJ SOLN
INTRAMUSCULAR | Status: AC | PRN
  Administered 2012-08-01: 1 mg via INTRAVENOUS

## 2012-08-01 MED ORDER — LIDOCAINE HCL 1 % IJ SOLN
INTRAMUSCULAR | Status: AC
  Filled 2012-08-01: qty 20

## 2012-08-01 MED ORDER — FENTANYL CITRATE 0.05 MG/ML IJ SOLN
INTRAMUSCULAR | Status: AC | PRN
  Administered 2012-08-01 (×4): 50 ug via INTRAVENOUS

## 2012-08-01 MED ORDER — MIDAZOLAM HCL 2 MG/2ML IJ SOLN
INTRAMUSCULAR | Status: AC
  Filled 2012-08-01: qty 6

## 2012-08-01 MED ORDER — CEFAZOLIN SODIUM-DEXTROSE 2-3 GM-% IV SOLR
INTRAVENOUS | Status: AC
  Filled 2012-08-01: qty 50

## 2012-08-01 MED ORDER — FENTANYL CITRATE 0.05 MG/ML IJ SOLN
INTRAMUSCULAR | Status: AC
  Filled 2012-08-01: qty 6

## 2012-08-01 MED ORDER — CEFAZOLIN SODIUM-DEXTROSE 2-3 GM-% IV SOLR
2.0000 g | Freq: Once | INTRAVENOUS | Status: AC
  Administered 2012-08-01: 2 g via INTRAVENOUS

## 2012-08-01 MED ORDER — SODIUM CHLORIDE 0.9 % IV SOLN
INTRAVENOUS | Status: DC
  Administered 2012-08-01: 14:00:00 via INTRAVENOUS

## 2012-08-01 NOTE — H&P (Signed)
Agree with PA note.    Signed,  Heath K. McCullough, MD Vascular & Interventional Radiologist [facility]  

## 2012-08-01 NOTE — Procedures (Signed)
Interventional Radiology Procedure Note  Procedure: Placement of a right IJ approach single lumen PowerPort.  Tip is positioned at the superior cavoatrial junction and catheter is ready for immediate use.  Complications: No immediate Recommendations:  - Ok to shower tomorrow - Do not submerge for 7 days - Routine line care   Signed,  Heath K. McCullough, MD Vascular & Interventional Radiologist [facility]  

## 2012-08-01 NOTE — H&P (Signed)
Chief Complaint: "I'm here for a portacath" Referring Physician:L[place] HPI: Amy Dominguez is an 76 y.o. female with uterine cancer who is referred for port placement, as she is to receive chemotherapy. PMHx and meds reviewed. She normally takes Pradaxa, but has not taken it since Mon 12/2 am. She's had a pacemaker placed in 2011  Past Medical History:  Past Medical History  Diagnosis Date  . Arthralgia   . Spinal stenosis     spinal stenosis LS spine  . Hypertension   . Asthma 2008 , 2012    RAD after BRONCHITIS    . Arthritis     hands, feet, hips, knees  . Headache     allergy related  . Complication of anesthesia     slow to respond  . Dysrhythmia     atrial fib-Medtronic Pacemaker-on pradaxa-off for surgery since 9.25 2013  . Symptomatic bradycardia 2011    Insertion of Medtronic Pacer    Past Surgical History:  Past Surgical History  Procedure Date  . Polypectomy 2008     negative 2011,Dr Jarold Motto  . Hemorrhoid surgery   . Tonsillectomy   . Tubal ligation   . Insert / replace / remove pacemaker [facility] pacemaker  09/2009  . Foot surgery     JOINT REPLACEMENT LT GREAT TOE   20 YRS AGO   . Cataract extraction     OS  . Lumbar laminectomy/decompression microdiscectomy 09/21/2011    Procedure: LUMBAR LAMINECTOMY/DECOMPRESSION MICRODISCECTOMY;  Surgeon: Tia Alert, MD;  Location: [facility]UR[facility] OR[facility];  Service: Neurosurgery;  Laterality: N/A;  lumbar laminectomy lumbar four-lumbar five  . Hysteroscopy w/d&c 05/29/2012    Procedure: DILATATION AND CURETTAGE /HYSTEROSCOPY;  Surgeon: Bennye Alm, MD;  Location: [facility] ORS;  Service: Gynecology;  Laterality: N/A;  W/ Resectascope  . Dilation and evacuation 05/29/2012    Procedure: DILATATION AND EVACUATION;  Surgeon: Bennye Alm, MD;  Location: [facility] ORS;  Service: Gynecology;  Laterality: N/A;    Family History:  Family History  Problem Relation Age of Onset  . Stroke Mother     post valve replacement;  warfarin missed  . Heart attack Father 43  . Breast cancer Sister   . Deep vein thrombosis Daughter     Factor S non functional    Social History:  reports that she has been smoking Cigarettes.  She has a 13.5 pack-year smoking history. She does not have any smokeless tobacco history on file. She reports that she drinks about 1.8 ounces of alcohol per week. She reports that she does not use illicit drugs.  Allergies:  Allergies  Allergen Reactions  . Tylenol (Acetaminophen) Other (See Comments)    Brand name tylenol causes headache    Medications: Calcium Carbonate-Vitamin D (CALCIUM 600 + D PO) (Taking) Sig - Route: Take 2 tablets by mouth 2 (two) times daily. - Oral Class: Historical Med lisinopril (PRINIVIL,ZESTRIL) 20 MG tablet (Taking) Sig - Route: Take 20 mg by mouth every evening. - Oral Class: Historical Med Number of times this order has been changed since signing: 2 Order [address] Multiple Vitamins-Minerals (MULTIVITAMINS THER. W/MINERALS) TABS (Taking) Sig - Route: Take 1 tablet by mouth daily. - Oral Class: Historical Med oxyCODONE (OXY IR/ROXICODONE) 5 MG immediate release tablet (Taking) 06/16/2012 Sig - Route: Take 5 mg by mouth every 4 (four) hours as needed. For pain - Oral Class: Historical Med Number of times this order has been changed since signing: 2 Order Audit  Trail potassium chloride SA (K-DUR,KLOR-CON) 20 MEQ tablet (Taking) 90 tablet 3 12/26/2011 Sig - Route: Take 1 tablet (20 mEq total) by mouth daily. - Oral Number of times this order has been changed since signing: 1 Order [address] torsemide (DEMADEX) 20 MG tablet (Taking) 30 tablet 12 02/28/2012 Sig - Route: Take 1 tablet (20 mg total) by mouth daily. - Oral Number of times this order has been changed since signing: 1 Order [address] vitamin E 400 UNIT capsule (Taking) Sig - Route: Take 800 Units by mouth daily. - Oral Class: Historical Med Number of times this order has been changed since signing: 1 Order Audit  Trail zolpidem (AMBIEN) 5 MG tablet (Taking) Sig - Route: Take 5 mg by mouth at bedtime as needed. Sleep - Oral Class: Historical Med Number of times this order has been changed since signing: 2 Order [address] Trail Dabigatran Etexilate Mesylate (PRADAXA PO) Sig - Route: Take 150 mg by mouth 2 (two) times daily. - Oral Class: Historical Med Number of times this order has been changed since signing: 3 Order [address] dexamethasone (DECADRON) 4 MG tablet 12 tablet 0 07/17/2012 Sig: Take 2 tabs twice a day x 3 days. Begin day prior to Taxotere =08-12-12. Class: Phone In Number of times this order has been changed since signing: 1 Order [address] lidocaine-prilocaine (EMLA) cream 30 g 1 07/17/2012 Sig - Route: Apply topically as needed. Apply to Porta-Cath 1 hour prior to access. - Topical Class: Phone In Number of times this order has been changed since signing: 1 Order [address] LORazepam (ATIVAN) 0.5 MG tablet 10 tablet 0 07/17/2012 Sig: Take 1/2 tab under tongue or swallow every 6 hours as needed for nausea Class: Phone In Number of times this order has been changed since signing: 1 Order [address] Trail ondansetron (ZOFRAN) 8 MG tablet 30 tablet 0 07/17/2012 Sig - Route: Take 1-2 tablets (8-16 mg total) by mouth every 8 (eight) hours as needed for nausea. - Oral Class: Phone In   Please HPI for pertinent positives, otherwise complete 10 system ROS negative.  Physical Exam: Blood pressure 128/49, pulse 80, temperature 97.8 F (36.6 C), temperature source Oral, last menstrual period 09/14/2011. There is no height or weight on file to calculate BMI.   General Appearance:  Alert, cooperative, no distress, appears stated age  Head:  Normocephalic, without obvious abnormality, atraumatic  ENT: Unremarkable  Neck: Supple, symmetrical, trachea midline, no adenopathy, thyroid: not enlarged, symmetric, no tenderness/mass/nodules  Lungs:   Clear to auscultation bilaterally, no w/r/r, respirations unlabored  without use of accessory muscles.  Chest Wall:  No tenderness or deformity  Heart:  Irregular rate and rhythm.  Neurologic: Normal affect, no gross deficits.   Results for orders placed during the hospital encounter of 08/01/12 (from the past 48 hour(s))  CBC WITH DIFFERENTIAL     Status: Normal   Collection Time   08/01/12 12:34 PM      Component Value Range Comment   WBC 8.5  4.0 - 10.5 K/uL    RBC 4.38  3.87 - 5.11 MIL/uL    Hemoglobin 12.5  12.0 - 15.0 g/dL    HCT 62.1  30.8 - 65.7 %    MCV 86.3  78.0 - 100.0 fL    MCH 28.5  26.0 - 34.0 pg    MCHC 33.1  30.0 - 36.0 g/dL    RDW 84.6  96.2 - 95.2 %    Platelets 225  150 - 400 K/uL  Neutrophils Relative 61  43 - 77 %    Neutro Abs 5.2  1.7 - 7.7 K/uL    Lymphocytes Relative 28  12 - 46 %    Lymphs Abs 2.4  0.7 - 4.0 K/uL    Monocytes Relative 9  3 - 12 %    Monocytes Absolute 0.8  0.1 - 1.0 K/uL    Eosinophils Relative 1  0 - 5 %    Eosinophils Absolute 0.1  0.0 - 0.7 K/uL    Basophils Relative 1  0 - 1 %    Basophils Absolute 0.1  0.0 - 0.1 K/uL    No results found.  Assessment/Plan Uterine cancer Discussed portacath procedure, risks, complications, use of sedation. Labs pending Consent signed in chart  Brayton El PA-C 08/01/2012, 1:17 PM

## 2012-08-06 ENCOUNTER — Encounter: Payer: Self-pay | Admitting: Oncology

## 2012-08-06 ENCOUNTER — Other Ambulatory Visit (HOSPITAL_BASED_OUTPATIENT_CLINIC_OR_DEPARTMENT_OTHER): Payer: Medicare Other | Admitting: Lab

## 2012-08-06 ENCOUNTER — Ambulatory Visit (HOSPITAL_BASED_OUTPATIENT_CLINIC_OR_DEPARTMENT_OTHER): Payer: Medicare Other

## 2012-08-06 ENCOUNTER — Other Ambulatory Visit: Payer: Self-pay

## 2012-08-06 ENCOUNTER — Ambulatory Visit (HOSPITAL_BASED_OUTPATIENT_CLINIC_OR_DEPARTMENT_OTHER): Payer: Medicare Other | Admitting: Oncology

## 2012-08-06 DIAGNOSIS — C55 Malignant neoplasm of uterus, part unspecified: Secondary | ICD-10-CM

## 2012-08-06 DIAGNOSIS — Z5111 Encounter for antineoplastic chemotherapy: Secondary | ICD-10-CM

## 2012-08-06 DIAGNOSIS — I4891 Unspecified atrial fibrillation: Secondary | ICD-10-CM

## 2012-08-06 DIAGNOSIS — F17200 Nicotine dependence, unspecified, uncomplicated: Secondary | ICD-10-CM

## 2012-08-06 DIAGNOSIS — M4800 Spinal stenosis, site unspecified: Secondary | ICD-10-CM

## 2012-08-06 LAB — CBC WITH DIFFERENTIAL/PLATELET
Basophils Absolute: 0.1 10*3/uL (ref 0.0–0.1)
Eosinophils Absolute: 0.2 10*3/uL (ref 0.0–0.5)
HGB: 12.5 g/dL (ref 11.6–15.9)
LYMPH%: 29.8 % (ref 14.0–49.7)
MCV: 87.5 fL (ref 79.5–101.0)
MONO#: 0.8 10*3/uL (ref 0.1–0.9)
MONO%: 9.9 % (ref 0.0–14.0)
NEUT#: 4.4 10*3/uL (ref 1.5–6.5)
Platelets: 177 10*3/uL (ref 145–400)
WBC: 7.8 10*3/uL (ref 3.9–10.3)

## 2012-08-06 MED ORDER — ZOLPIDEM TARTRATE 10 MG PO TABS: 5.0000 mg | ORAL_TABLET | Freq: Every evening | ORAL | Status: DC | PRN

## 2012-08-06 MED ORDER — HEPARIN SOD (PORK) LOCK FLUSH 100 UNIT/ML IV SOLN
500.0000 [IU] | Freq: Once | INTRAVENOUS | Status: AC | PRN
  Filled 2012-08-06: qty 5

## 2012-08-06 MED ORDER — SODIUM CHLORIDE 0.9 % IJ SOLN
10.0000 mL | INTRAMUSCULAR | Status: DC | PRN
  Filled 2012-08-06: qty 10

## 2012-08-06 MED ORDER — DEXAMETHASONE SODIUM PHOSPHATE 10 MG/ML IJ SOLN
10.0000 mg | Freq: Once | INTRAMUSCULAR | Status: AC
  Administered 2012-08-06: 10 mg via INTRAVENOUS

## 2012-08-06 MED ORDER — ONDANSETRON 8 MG/50ML IVPB (CHCC)
8.0000 mg | Freq: Once | INTRAVENOUS | Status: AC
  Administered 2012-08-06: 8 mg via INTRAVENOUS

## 2012-08-06 MED ORDER — SODIUM CHLORIDE 0.9 % IV SOLN
900.0000 mg/m2 | Freq: Once | INTRAVENOUS | Status: AC
  Administered 2012-08-06: 1710 mg via INTRAVENOUS
  Filled 2012-08-06: qty 45

## 2012-08-06 MED ORDER — SODIUM CHLORIDE 0.9 % IV SOLN
Freq: Once | INTRAVENOUS | Status: AC
  Administered 2012-08-06: 09:00:00 via INTRAVENOUS

## 2012-08-06 NOTE — Progress Notes (Unsigned)
Med Onc  Notified now by [facility]  financial office that patient's insurance requests peer to peer for gemcitabine. Called # for insurance immediately on receiving this notification [phone] and spoke to Ms Lynnea Ferrier, indicating to her that I am available now to speak with their physician. She "will put me on the schedule" and hopes that I will be called back today or tomorrow. I gave her my direct office phone #. Patient's insurance case # is [id number]  L.Darrold Span, MD

## 2012-08-06 NOTE — Patient Instructions (Addendum)
Antiemetics and Remus Loffler will be at [facility] today  Call if needed prior to next appointment with Dr Darrold Span on 08-10-12

## 2012-08-06 NOTE — Progress Notes (Signed)
OFFICE PROGRESS NOTE   08/06/2012   Physicians:[name], [name] (PCP), [name] / [name] (gyn), [name] / [name] (ortho), [name], [name] (derm), [name] (podiatry), [name] (GI), [name]   INTERVAL HISTORY:  Patient is seen in infusion area, together with husband, to receive day 1 cycle 1 gemzar in her gemzar/taxotere regimen for IA high grade uterine leiomyosarcoma. She had PAC placed by IR without difficulty on 08-01-12. She has not yet picked up antiemetics, also requests refill on ambien, which she tells me has been more effective at 10 mg than 5mg . Patient enjoyed Thanksgiving with family and tells me that she has been feeling well.  Patient had new onset vaginal spotting in late Sept/ early Oct 2013, initially thought related to Pradaxa and use of OTC estrogen for hot flashes during summer. She was seen at Dr [name] office within 2-3 days, with ultrasound done (report not in this EMR) then [facility] with hysteroscopic myomectomy and polypectomy by [name] on 05-29-12, path [id number] with high grade leiomyosarcoma with >10 mitoses/ 10 HPF, 3.5 cm into outer half of myometrium, no LVSI. This pathology was also reviewed at [name], findings confirmed. She was referred to [name], had CT CAP done in [facility] system 06-12-12 with 2 indeterminate left lung nodules largest 6 mm and 3 cm posterior uterine myometrial mass without apparent extrauterine extension or metastatic disease. She went to robotic assisted laparoscopic hysterectomy, bilateral salpingo-oophorectomy 06/15/12 at [name] by [name] with findings of physiologic adhesions of right adnexum to appendix; small fibroid noted on uterus, otherwise normal abdominal survey; no evidence of extrauterine disease on evaluation. Pathology from the [name] surgery [phone]) had high grade leiomyosarcoma with > 20 mitoses/10 HPF and invasion into outer half of myometrium, negative LVSI and  benign ovaries/tubes. She had no difficulty with the surgeries and has been recovering well since then. She saw [name] in follow up on 07-12-12, with recommendation for gemzar/taxotere (probably 4 cycles, tho this is not stated in Dr Denman George note). Only available study randomizes to this treatment vs observation, thus recommendation that treatment be off study.    Remainder of 10 point Review of Systems negative.  Objective:  Vital signs in last 24 hours: per chemo flow sheets. Respirations not labored RA. Alert, NAD. PAC infusing without difficulty, no significant bruising apparent. Heart RRR. Abdomen soft, NT. LE no edema.   Lab Results:  Results for orders placed in visit on 08/06/12  CBC WITH DIFFERENTIAL      Component Value Range   WBC 7.8  3.9 - 10.3 10e3/uL   NEUT# 4.4  1.5 - 6.5 10e3/uL   HGB 12.5  11.6 - 15.9 g/dL   HCT 10.2  72.5 - 36.6 %   Platelets 177  145 - 400 10e3/uL   MCV 87.5  79.5 - 101.0 fL   MCH 28.5  25.1 - 34.0 pg   MCHC 32.6  31.5 - 36.0 g/dL   RBC 4.40  3.47 - 4.25 10e6/uL   RDW 14.2  11.2 - 14.5 %   lymph# 2.3  0.9 - 3.3 10e3/uL   MONO# 0.8  0.1 - 0.9 10e3/uL   Eosinophils Absolute 0.2  0.0 - 0.5 10e3/uL   Basophils Absolute 0.1  0.0 - 0.1 10e3/uL   NEUT% 56.9  38.4 - 76.8 %   LYMPH% 29.8  14.0 - 49.7 %   MONO% 9.9  0.0 - 14.0 %   EOS% 2.8  0.0 - 7.0 %  BASO% 0.6  0.0 - 2.0 %     Studies/Results:  No results found.  Medications: I have reviewed the patient's current medications.  Assessment/Plan: 1.1A high grade uterine leiomyosarcoma: adjuvant chemotherapy with gemzar and taxotere begun at recommendation of gyn oncology, day 1 cycle 1 today. I will see her on 08-10-12 and she will have day 8 cycle 1 on 08-13-12. She knows that she can call at any time if questions or concerns. 2.PAC in 3.long and ongoing tobacco 4. 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 Spinal stenosis, HTN, asthma, borderline  hypothyroid    Amy Dominguez P, MD   08/06/2012, 9:18 AM

## 2012-08-06 NOTE — Progress Notes (Unsigned)
Medical Oncology   This MD called back to [facility] now as no return call from this am; held x 25 min prior to speaking to [facility] MD, who denied gemzar for stage IA uterine leiomyosarcoma (taxotere is covered).  A letter of denial will be sent to patient and to this office, which will include mechanism for appealing the denial directly to [facility]. Discussed with [employer] financial staff, who will look into assistance from pharmaceutical company if denial is not overturned. Gyn oncology aware and expect [name] will be able to do a letter of appeal. We will let patient know of situation when information from the pharmaceutical company is available. Ila Mcgill, MD

## 2012-08-06 NOTE — Patient Instructions (Addendum)
[facility] Discharge Instructions for Patients Receiving Chemotherapy  Today you received the following chemotherapy agents GEMZAR To help prevent nausea and vomiting after your treatment, we encourage you to take your nausea medication as directed.   If you develop nausea and vomiting that is not controlled by your nausea medication, call the clinic. If it is after clinic hours your family physician or the after hours number for the clinic or go to the Emergency Department.   BELOW ARE SYMPTOMS THAT SHOULD BE REPORTED IMMEDIATELY:  *FEVER GREATER THAN 100.5 F  *CHILLS WITH OR WITHOUT FEVER  NAUSEA AND VOMITING THAT IS NOT CONTROLLED WITH YOUR NAUSEA MEDICATION  *UNUSUAL SHORTNESS OF BREATH  *UNUSUAL BRUISING OR BLEEDING  TENDERNESS IN MOUTH AND THROAT WITH OR WITHOUT PRESENCE OF ULCERS  *URINARY PROBLEMS  *BOWEL PROBLEMS  UNUSUAL RASH Items with * indicate a potential emergency and should be followed up as soon as possible.  One of the nurses will contact you 24 hours after your treatment. Please let the nurse know about any problems that you may have experienced. Feel free to call the clinic you have any questions or concerns. The clinic phone number is [phone].   I have been informed and understand all the instructions given to me. I know to contact the clinic, my physician, or go to the Emergency Department if any problems should occur. I do not have any questions at this time, but understand that I may call the clinic during office hours   should I have any questions or need assistance in obtaining follow up care.    __________________________________________  _____________  __________ Signature of Patient or Authorized Representative            Date                   Time    __________________________________________ Nurse's Signature

## 2012-08-07 ENCOUNTER — Telehealth: Payer: Self-pay | Admitting: *Deleted

## 2012-08-07 ENCOUNTER — Telehealth: Payer: Self-pay | Admitting: Oncology

## 2012-08-07 ENCOUNTER — Encounter: Payer: Self-pay | Admitting: Oncology

## 2012-08-07 NOTE — Telephone Encounter (Signed)
Dr. Darrold Span will call patient herself.

## 2012-08-07 NOTE — Telephone Encounter (Signed)
Message copied by Orbie Hurst on Tue Aug 07, 2012 10:15 AM ------      Message from: Orbie Hurst      Created: Tue Aug 07, 2012 10:13 AM      Regarding: chemo follow up call       1st time gemzar   Dr. Darrold Span

## 2012-08-07 NOTE — Telephone Encounter (Signed)
Medical Oncology  Called patient to follow up chemotherapy yesterday (triage RN aware that MD would do follow up phone call) and to discuss new information re insurance coverage for the gemzar. Spoke with husband, as patient is out doing errands and may not be back before 5PM. Husband tells me that she has done very well, with no problems whatsoever since day 1 cycle 1 gemzar yesterday -- they are both very pleased.  I told husband that we learned after chemotherapy was given yesterday that her insurance has denied gemzar coverage for this diagnosis, this despite my peer to peer discussion with MD for the insurance company. Next step is to appeal the denial, which we are glad to do; [name] will be at [facility] tomorrow and E.Joanne Gavel will request that she do the letter of appeal. If insurance will not cover, pharmaceutical company has assistance program. Patient/ husband will come to [facility] ~ 10AM tomorrow 08-08-12 to sign paper for pharmaceutical company, and will talk also with E.Sutton then.  Husband expressed appreciation for call.  Ila Mcgill, MD

## 2012-08-10 ENCOUNTER — Encounter: Payer: Self-pay | Admitting: Oncology

## 2012-08-10 ENCOUNTER — Telehealth: Payer: Self-pay | Admitting: *Deleted

## 2012-08-10 ENCOUNTER — Other Ambulatory Visit (HOSPITAL_BASED_OUTPATIENT_CLINIC_OR_DEPARTMENT_OTHER): Payer: Medicare Other | Admitting: Lab

## 2012-08-10 ENCOUNTER — Ambulatory Visit (HOSPITAL_BASED_OUTPATIENT_CLINIC_OR_DEPARTMENT_OTHER): Payer: Medicare Other | Admitting: Oncology

## 2012-08-10 ENCOUNTER — Telehealth: Payer: Self-pay | Admitting: Oncology

## 2012-08-10 VITALS — BP 132/74 | HR 95 | Temp 97.5°F | Resp 18 | Ht 62.25 in | Wt 185.6 lb

## 2012-08-10 DIAGNOSIS — C55 Malignant neoplasm of uterus, part unspecified: Secondary | ICD-10-CM

## 2012-08-10 DIAGNOSIS — I4891 Unspecified atrial fibrillation: Secondary | ICD-10-CM

## 2012-08-10 DIAGNOSIS — M4800 Spinal stenosis, site unspecified: Secondary | ICD-10-CM

## 2012-08-10 DIAGNOSIS — Z7901 Long term (current) use of anticoagulants: Secondary | ICD-10-CM

## 2012-08-10 LAB — CBC WITH DIFFERENTIAL/PLATELET
Eosinophils Absolute: 0.1 10*3/uL (ref 0.0–0.5)
HCT: 34.8 % (ref 34.8–46.6)
LYMPH%: 31.8 % (ref 14.0–49.7)
MCHC: 33.6 g/dL (ref 31.5–36.0)
MCV: 87.8 fL (ref 79.5–101.0)
MONO#: 0.1 10*3/uL (ref 0.1–0.9)
MONO%: 0.9 % (ref 0.0–14.0)
NEUT#: 5.2 10*3/uL (ref 1.5–6.5)
NEUT%: 65.2 % (ref 38.4–76.8)
Platelets: 151 10*3/uL (ref 145–400)
RBC: 3.96 10*6/uL (ref 3.70–5.45)

## 2012-08-10 LAB — COMPREHENSIVE METABOLIC PANEL (CC13)
Alkaline Phosphatase: 82 U/L (ref 40–150)
BUN: 20 mg/dL (ref 7.0–26.0)
CO2: 28 mEq/L (ref 22–29)
Creatinine: 0.9 mg/dL (ref 0.6–1.1)
Glucose: 89 mg/dl (ref 70–99)
Sodium: 139 mEq/L (ref 136–145)
Total Bilirubin: 1.5 mg/dL — ABNORMAL HIGH (ref 0.20–1.20)
Total Protein: 6.4 g/dL (ref 6.4–8.3)

## 2012-08-10 NOTE — Telephone Encounter (Signed)
Per staff message and POF I have scheduled appts.  JMW  

## 2012-08-10 NOTE — Progress Notes (Signed)
OFFICE PROGRESS NOTE   08/10/2012   Physicians:[name], [name] (PCP), [name] / [name] (gyn), [name] / [name] (ortho), [name], [name] (derm), [name] (podiatry), [name] (GI), [name]   INTERVAL HISTORY:  Patient is seen, together with husband, having had day 1 cycle 1 gemzar/ taxotere on 08-06-12 for IA high grade uterine leiomyosarcoma. She tolerated the day 1 gemzar very well; she will have day 8 taxotere + gemzar on 12-16 with neulasta on 12-17 Patient had new onset vaginal spotting in late Sept/ early Oct 2013, initially thought related to Pradaxa and use of OTC estrogen for hot flashes during summer. She was seen at Dr [name] office within 2-3 days, with ultrasound done (report not in this EMR) then [facility] with hysteroscopic myomectomy and polypectomy by [name] on 05-29-12, path [id number] with high grade leiomyosarcoma with >10 mitoses/ 10 HPF, 3.5 cm into outer half of myometrium, no LVSI. This pathology was also reviewed at [facility], findings confirmed. She was referred to [name], had CT CAP done in [facility] system 06-12-12 with 2 indeterminate left lung nodules largest 6 mm and 3 cm posterior uterine myometrial mass without apparent extrauterine extension or metastatic disease. She went to robotic assisted laparoscopic hysterectomy, bilateral salpingo-oophorectomy 06/15/12 at [facility] by [name] with findings of physiologic adhesions of right adnexum to appendix; small fibroid noted on uterus, otherwise normal abdominal survey; no evidence of extrauterine disease on evaluation. Pathology from the [facility] surgery [phone]) had high grade leiomyosarcoma with > 20 mitoses/10 HPF and invasion into outer half of myometrium, negative LVSI and benign ovaries/tubes. Gyn oncology has recommended gemzar/ taxotere, probably 4 cycles, this off study due to her high risk pathology as the present randomized trial has observation arm.  The PAC  functioned without difficulty for chemo. She had no nausea, no fever after gemzar, no fatigue, bowels moving regularly, no other concerns.  Remainder of 10 point Review of Systems negative.  Patient's insurance has denied gemzar for this diagnosis despite my peer to peer request. The denial will be appealed including letter and other information from [name]. Patient and her husband are aware and in agreement with this plan.  Patient plans to be out of [place] from ~ 12-20 thru ~08-29-12, so cycle 2 will be delayed until 09-01-11 (then she may prefer to move treatment days to Mondays). Objective:  Vital signs in last 24 hours:  BP 132/74  Pulse 95  Temp 97.5 F (36.4 C) (Oral)  Resp 18  Ht 5' 2.25" (1.581 m)  Wt 185 lb 9.6 oz (84.188 kg)  BMI 33.67 kg/m2  LMP 09/14/2011  Easily mobile, looks comfortable.  HEENT:PERRLA, sclera clear, anicteric and oropharynx clear, no lesions LymphaticsCervical, supraclavicular, and axillary nodes normal. Resp: clear to auscultation bilaterally and normal percussion bilaterally Cardio: regular rate and rhythm GI: soft, non-tender; bowel sounds normal; no masses,  no organomegaly Extremities: extremities normal, atraumatic, no cyanosis or edema. Portacath-without erythema or tenderness Lab Results:  Results for orders placed in visit on 08/10/12  CBC WITH DIFFERENTIAL      Component Value Range   WBC 8.0  3.9 - 10.3 10e3/uL   NEUT# 5.2  1.5 - 6.5 10e3/uL   HGB 11.7  11.6 - 15.9 g/dL   HCT 95.2  84.1 - 32.4 %   Platelets 151  145 - 400 10e3/uL   MCV 87.8  79.5 - 101.0 fL   MCH 29.5  25.1 - 34.0 pg   MCHC  33.6  31.5 - 36.0 g/dL   RBC 9.14  7.82 - 9.56 10e6/uL   RDW 13.8  11.2 - 14.5 %   lymph# 2.6  0.9 - 3.3 10e3/uL   MONO# 0.1  0.1 - 0.9 10e3/uL   Eosinophils Absolute 0.1  0.0 - 0.5 10e3/uL   Basophils Absolute 0.1  0.0 - 0.1 10e3/uL   NEUT% 65.2  38.4 - 76.8 %   LYMPH% 31.8  14.0 - 49.7 %   MONO% 0.9  0.0 - 14.0 %   EOS% 1.2  0.0 - 7.0 %    BASO% 0.9  0.0 - 2.0 %  COMPREHENSIVE METABOLIC PANEL (CC13)      Component Value Range   Sodium 139  136 - 145 mEq/L   Potassium 4.4  3.5 - 5.1 mEq/L   Chloride 101  98 - 107 mEq/L   CO2 28  22 - 29 mEq/L   Glucose 89  70 - 99 mg/dl   BUN 21.3  7.0 - 08.6 mg/dL   Creatinine 0.9  0.6 - 1.1 mg/dL   Total Bilirubin 5.78 (*) 0.20 - 1.20 mg/dL   Alkaline Phosphatase 82  40 - 150 U/L   AST 23  5 - 34 U/L   ALT 13  0 - 55 U/L   Total Protein 6.4  6.4 - 8.3 g/dL   Albumin 3.7  3.5 - 5.0 g/dL   Calcium 9.9  8.4 - 46.9 mg/dL     Studies/Results:  No results found.  Medications: I have reviewed the patient's current medications. She is given written and oral instructions for the decadron to be used around taxotere. She is aware of neulasta injection.  Assessment/Plan:  1.  1A high grade uterine leiomyosarcoma: continuing adjuvant treatment with gemzar/ taxotere as above 2.PAC in 3.ongoing tobacco which we will continue to try to address 4.atrial fibrillation on pradaxa; pacemaker 5.spinal stenosis, HTN, asthma, borderline hypothyroid, chronic bursitis hips  Patient and husband had questions answered to their satisfaction and are in agreement with plan above.  LIVESAY,LENNIS P, MD   08/10/2012, 10:09 PM

## 2012-08-10 NOTE — Patient Instructions (Signed)
Decadron (dexamethasone, steroid) 4 mg:  2 tablets with food AM and ~ 4 PM for 3 days beginning day prior to taxotere.  So you will take this 12-16, 12-17 and 12-18.

## 2012-08-10 NOTE — Telephone Encounter (Signed)
appts made and printed for pt  Pt aware that tx will be added,mw-email

## 2012-08-13 ENCOUNTER — Other Ambulatory Visit: Payer: Self-pay | Admitting: Oncology

## 2012-08-13 ENCOUNTER — Ambulatory Visit (HOSPITAL_BASED_OUTPATIENT_CLINIC_OR_DEPARTMENT_OTHER): Payer: Medicare Other

## 2012-08-13 ENCOUNTER — Other Ambulatory Visit (HOSPITAL_BASED_OUTPATIENT_CLINIC_OR_DEPARTMENT_OTHER): Payer: Medicare Other | Admitting: Lab

## 2012-08-13 VITALS — BP 123/63 | HR 76 | Temp 98.3°F | Resp 20

## 2012-08-13 DIAGNOSIS — Z5111 Encounter for antineoplastic chemotherapy: Secondary | ICD-10-CM

## 2012-08-13 DIAGNOSIS — C55 Malignant neoplasm of uterus, part unspecified: Secondary | ICD-10-CM

## 2012-08-13 LAB — COMPREHENSIVE METABOLIC PANEL (CC13)
Albumin: 3.4 g/dL — ABNORMAL LOW (ref 3.5–5.0)
BUN: 20 mg/dL (ref 7.0–26.0)
CO2: 26 mEq/L (ref 22–29)
Glucose: 148 mg/dl — ABNORMAL HIGH (ref 70–99)
Sodium: 138 mEq/L (ref 136–145)
Total Bilirubin: 0.39 mg/dL (ref 0.20–1.20)
Total Protein: 6.5 g/dL (ref 6.4–8.3)

## 2012-08-13 LAB — CBC WITH DIFFERENTIAL/PLATELET
Basophils Absolute: 0 10*3/uL (ref 0.0–0.1)
Eosinophils Absolute: 0 10*3/uL (ref 0.0–0.5)
HCT: 30.5 % — ABNORMAL LOW (ref 34.8–46.6)
HGB: 10.7 g/dL — ABNORMAL LOW (ref 11.6–15.9)
LYMPH%: 13.6 % — ABNORMAL LOW (ref 14.0–49.7)
MONO#: 0.1 10*3/uL (ref 0.1–0.9)
NEUT#: 6.5 10*3/uL (ref 1.5–6.5)
Platelets: 106 10*3/uL — ABNORMAL LOW (ref 145–400)
RBC: 3.54 10*6/uL — ABNORMAL LOW (ref 3.70–5.45)
WBC: 7.6 10*3/uL (ref 3.9–10.3)

## 2012-08-13 MED ORDER — ONDANSETRON 16 MG/50ML IVPB (CHCC)
16.0000 mg | Freq: Once | INTRAVENOUS | Status: AC
  Administered 2012-08-13: 16 mg via INTRAVENOUS

## 2012-08-13 MED ORDER — DOCETAXEL CHEMO INJECTION 160 MG/16ML
100.0000 mg/m2 | Freq: Once | INTRAVENOUS | Status: AC
  Administered 2012-08-13: 190 mg via INTRAVENOUS
  Filled 2012-08-13: qty 19

## 2012-08-13 MED ORDER — DEXAMETHASONE SODIUM PHOSPHATE 4 MG/ML IJ SOLN
20.0000 mg | Freq: Once | INTRAMUSCULAR | Status: AC
  Administered 2012-08-13: 20 mg via INTRAVENOUS

## 2012-08-13 MED ORDER — HEPARIN SOD (PORK) LOCK FLUSH 100 UNIT/ML IV SOLN
500.0000 [IU] | Freq: Once | INTRAVENOUS | Status: AC | PRN
  Administered 2012-08-13: 500 [IU]
  Filled 2012-08-13: qty 5

## 2012-08-13 MED ORDER — SODIUM CHLORIDE 0.9 % IJ SOLN
10.0000 mL | INTRAMUSCULAR | Status: DC | PRN
  Administered 2012-08-13: 10 mL
  Filled 2012-08-13: qty 10

## 2012-08-13 MED ORDER — SODIUM CHLORIDE 0.9 % IV SOLN
900.0000 mg/m2 | Freq: Once | INTRAVENOUS | Status: AC
  Administered 2012-08-13: 1710 mg via INTRAVENOUS
  Filled 2012-08-13: qty 45

## 2012-08-13 MED ORDER — SODIUM CHLORIDE 0.9 % IV SOLN: Freq: Once | INTRAVENOUS | Status: DC

## 2012-08-13 NOTE — Patient Instructions (Addendum)
[facility] Discharge Instructions for Patients Receiving Chemotherapy  Today you received the following chemotherapy agents Taxotere/Gemzar To help prevent nausea and vomiting after your treatment, we encourage you to take your nausea medication as prescribed.  If you develop nausea and vomiting that is not controlled by your nausea medication, call the clinic. If it is after clinic hours your family physician or the after hours number for the clinic or go to the Emergency Department.   BELOW ARE SYMPTOMS THAT SHOULD BE REPORTED IMMEDIATELY:  *FEVER GREATER THAN 100.5 F  *CHILLS WITH OR WITHOUT FEVER  NAUSEA AND VOMITING THAT IS NOT CONTROLLED WITH YOUR NAUSEA MEDICATION  *UNUSUAL SHORTNESS OF BREATH  *UNUSUAL BRUISING OR BLEEDING  TENDERNESS IN MOUTH AND THROAT WITH OR WITHOUT PRESENCE OF ULCERS  *URINARY PROBLEMS  *BOWEL PROBLEMS  UNUSUAL RASH Items with * indicate a potential emergency and should be followed up as soon as possible.  One of the nurses will contact you 24 hours after your treatment. Please let the nurse know about any problems that you may have experienced. Feel free to call the clinic you have any questions or concerns. The clinic phone number is [phone].   I have been informed and understand all the instructions given to me. I know to contact the clinic, my physician, or go to the Emergency Department if any problems should occur. I do not have any questions at this time, but understand that I may call the clinic during office hours   should I have any questions or need assistance in obtaining follow up care.    __________________________________________  _____________  __________ Signature of Patient or Authorized Representative            Date                   Time    __________________________________________ Nurse's Signature

## 2012-08-13 NOTE — Progress Notes (Signed)
Tried to get Gemzar approved online and they gave me Case ID [id number].  Per their guidelines they have to have Stage IV disease. Dr. Darrold Span tried to do a peer to peer review and they said no.  08/07/12 We received the official denial.  08/08/12 Dr. Duard Brady is going to do a letter and send some addition information.  08/10/12 Info and letter from Dr. Duard Brady.  08/13/12 Faxed everything to Appeals and Grievances Dept. Fax [phone]. The phone number is [phone]. I requested a Fast Appeal.

## 2012-08-13 NOTE — Progress Notes (Signed)
Patient declines ice packs for her hands.

## 2012-08-14 ENCOUNTER — Ambulatory Visit (HOSPITAL_BASED_OUTPATIENT_CLINIC_OR_DEPARTMENT_OTHER): Payer: Medicare Other

## 2012-08-14 VITALS — BP 126/72 | HR 89 | Temp 98.3°F

## 2012-08-14 DIAGNOSIS — C55 Malignant neoplasm of uterus, part unspecified: Secondary | ICD-10-CM

## 2012-08-14 MED ORDER — PEGFILGRASTIM INJECTION 6 MG/0.6ML
6.0000 mg | Freq: Once | SUBCUTANEOUS | Status: AC
  Administered 2012-08-14: 6 mg via SUBCUTANEOUS
  Filled 2012-08-14: qty 0.6

## 2012-08-15 ENCOUNTER — Telehealth: Payer: Self-pay | Admitting: Oncology

## 2012-08-15 NOTE — Telephone Encounter (Signed)
called pt to verify appts,done,aware

## 2012-08-17 ENCOUNTER — Telehealth: Payer: Self-pay

## 2012-08-17 NOTE — Telephone Encounter (Signed)
Amy Dominguez called stating that she has been having a lot of aches from the neulasta injection Tuesday.  A heating pad helps but it is quite uncomfortable.  The pain medication wans not effective.  She has a low grade temp of 99.2.  This is probably due to the neulasta injection.  She  Does not have any signs or symptoms of infectious process in lungs or urine.  She is actually going to leave with in the hour to go to [place]. For Christmas. They are driving over 2 days.  Told her that if she feels poorly or has a temp of 101 or greater in [place]. To go to local ED. The nuelasta shortens the window that her counts are low so she could still be more prone to an infection.  Pt. Verbalized understanding.   Amy Dominguez wanted to know if this is how she is going to feel after the neulasta injection each time.  Told her that she may have a very similar experience.  Hopefully next week she the discomfort will disapate.

## 2012-08-27 ENCOUNTER — Other Ambulatory Visit: Payer: Self-pay | Admitting: Oncology

## 2012-08-27 ENCOUNTER — Other Ambulatory Visit: Payer: Self-pay | Admitting: Internal Medicine

## 2012-08-31 ENCOUNTER — Telehealth: Payer: Self-pay | Admitting: *Deleted

## 2012-08-31 ENCOUNTER — Ambulatory Visit (HOSPITAL_BASED_OUTPATIENT_CLINIC_OR_DEPARTMENT_OTHER): Payer: Medicare Other | Admitting: Oncology

## 2012-08-31 ENCOUNTER — Ambulatory Visit (HOSPITAL_BASED_OUTPATIENT_CLINIC_OR_DEPARTMENT_OTHER): Payer: Medicare Other

## 2012-08-31 ENCOUNTER — Telehealth: Payer: Self-pay | Admitting: Oncology

## 2012-08-31 ENCOUNTER — Other Ambulatory Visit: Payer: Self-pay | Admitting: *Deleted

## 2012-08-31 ENCOUNTER — Other Ambulatory Visit (HOSPITAL_BASED_OUTPATIENT_CLINIC_OR_DEPARTMENT_OTHER): Payer: Medicare Other | Admitting: Lab

## 2012-08-31 ENCOUNTER — Encounter: Payer: Self-pay | Admitting: Oncology

## 2012-08-31 VITALS — BP 118/65 | HR 95 | Temp 98.8°F | Resp 20 | Ht 63.0 in | Wt 180.5 lb

## 2012-08-31 DIAGNOSIS — R110 Nausea: Secondary | ICD-10-CM

## 2012-08-31 DIAGNOSIS — C55 Malignant neoplasm of uterus, part unspecified: Secondary | ICD-10-CM

## 2012-08-31 DIAGNOSIS — Z5111 Encounter for antineoplastic chemotherapy: Secondary | ICD-10-CM

## 2012-08-31 DIAGNOSIS — K5900 Constipation, unspecified: Secondary | ICD-10-CM

## 2012-08-31 DIAGNOSIS — I4891 Unspecified atrial fibrillation: Secondary | ICD-10-CM

## 2012-08-31 LAB — COMPREHENSIVE METABOLIC PANEL (CC13)
ALT: 9 U/L (ref 0–55)
Albumin: 3.5 g/dL (ref 3.5–5.0)
CO2: 25 mEq/L (ref 22–29)
Calcium: 9.5 mg/dL (ref 8.4–10.4)
Chloride: 109 mEq/L — ABNORMAL HIGH (ref 98–107)
Sodium: 143 mEq/L (ref 136–145)
Total Protein: 6.6 g/dL (ref 6.4–8.3)

## 2012-08-31 LAB — CBC WITH DIFFERENTIAL/PLATELET
Eosinophils Absolute: 0.1 10*3/uL (ref 0.0–0.5)
MONO#: 1.2 10*3/uL — ABNORMAL HIGH (ref 0.1–0.9)
NEUT#: 9.1 10*3/uL — ABNORMAL HIGH (ref 1.5–6.5)
Platelets: 352 10*3/uL (ref 145–400)
RBC: 4.03 10*6/uL (ref 3.70–5.45)
RDW: 16.5 % — ABNORMAL HIGH (ref 11.2–14.5)
WBC: 13.5 10*3/uL — ABNORMAL HIGH (ref 3.9–10.3)
nRBC: 1 % — ABNORMAL HIGH (ref 0–0)

## 2012-08-31 LAB — TECHNOLOGIST REVIEW: Technologist Review: 1

## 2012-08-31 MED ORDER — SODIUM CHLORIDE 0.9 % IJ SOLN
10.0000 mL | INTRAMUSCULAR | Status: DC | PRN
  Administered 2012-08-31: 10 mL
  Filled 2012-08-31: qty 10

## 2012-08-31 MED ORDER — DEXAMETHASONE 4 MG PO TABS: ORAL_TABLET | ORAL | Status: DC

## 2012-08-31 MED ORDER — HEPARIN SOD (PORK) LOCK FLUSH 100 UNIT/ML IV SOLN
500.0000 [IU] | Freq: Once | INTRAVENOUS | Status: AC | PRN
  Administered 2012-08-31: 500 [IU]
  Filled 2012-08-31: qty 5

## 2012-08-31 MED ORDER — SODIUM CHLORIDE 0.9 % IV SOLN
900.0000 mg/m2 | Freq: Once | INTRAVENOUS | Status: AC
  Administered 2012-08-31: 1710 mg via INTRAVENOUS
  Filled 2012-08-31: qty 45

## 2012-08-31 MED ORDER — METOCLOPRAMIDE HCL 10 MG PO TABS: 10.0000 mg | ORAL_TABLET | Freq: Four times a day (QID) | ORAL | Status: DC

## 2012-08-31 MED ORDER — DEXAMETHASONE SODIUM PHOSPHATE 10 MG/ML IJ SOLN
10.0000 mg | Freq: Once | INTRAMUSCULAR | Status: AC
  Administered 2012-08-31: 10 mg via INTRAVENOUS

## 2012-08-31 MED ORDER — ZOLPIDEM TARTRATE 10 MG PO TABS: 5.0000 mg | ORAL_TABLET | Freq: Every evening | ORAL | Status: DC | PRN

## 2012-08-31 MED ORDER — ONDANSETRON 8 MG/50ML IVPB (CHCC)
8.0000 mg | Freq: Once | INTRAVENOUS | Status: AC
  Administered 2012-08-31: 8 mg via INTRAVENOUS

## 2012-08-31 MED ORDER — SODIUM CHLORIDE 0.9 % IV SOLN
Freq: Once | INTRAVENOUS | Status: AC
  Administered 2012-08-31: 11:00:00 via INTRAVENOUS

## 2012-08-31 NOTE — Telephone Encounter (Signed)
Per staff phone call and POF I have scheduled appts. JMW  

## 2012-08-31 NOTE — Progress Notes (Signed)
OFFICE PROGRESS NOTE   08/31/2012   Physicians:[name], [name] (PCP), [name] / [name] (gyn), [name] / [name] (ortho), [name], [name] (derm), [name] (podiatry), [name] (GI), [name]   INTERVAL HISTORY:  Patient is seen, together with husband, in continuing attention to adjuvant gemzar/taxotere for IA high grade uterine leiomyosarcoma, cycle 2 day 1 delayed until today due to her holiday travel. She had periorbital itching and discoloration for a number of days after day 8 taxotere+gemzar,some nausea with more constipation and gastric fullness, and general fatigue. She does not feel that she has completely recovered to baseline yet, even with the week delay. She had neulasta day 9, with low back pain not improved with oral pain medication but helped with heating pad.  Patient had new onset vaginal spotting in late Sept/ early Oct 2013, initially thought related to Pradaxa and use of OTC estrogen for hot flashes during summer. She was seen at Dr [name] office within 2-3 days, with ultrasound done (report not in this EMR) then [facility] with hysteroscopic myomectomy and polypectomy by [name] on 05-29-12, path [id number] with high grade leiomyosarcoma with >10 mitoses/ 10 HPF, 3.5 cm into outer half of myometrium, no LVSI. This pathology was also reviewed at [facility], findings confirmed. She was referred to [name], had CT CAP done in [facility] system 06-12-12 with 2 indeterminate left lung nodules largest 6 mm and 3 cm posterior uterine myometrial mass without apparent extrauterine extension or metastatic disease. She went to robotic assisted laparoscopic hysterectomy, bilateral salpingo-oophorectomy 06/15/12 at [facility] by [name] with findings of physiologic adhesions of right adnexum to appendix; small fibroid noted on uterus, otherwise normal abdominal survey; no evidence of extrauterine disease on evaluation. Pathology from the [facility]  surgery [phone]) had high grade leiomyosarcoma with > 20 mitoses/10 HPF and invasion into outer half of myometrium, negative LVSI and benign ovaries/tubes. Gyn oncology has recommended gemzar/ taxotere, probably 4 cycles, this off study due to her high risk pathology as the present randomized trial has observation arm.  Nausea medications were somewhat helpful, patient eating some and drinking fluids. We will try adding reglan in hopes of improving GI motility, and patient would like to try accupuncture as this helped her sister during breast cancer treatments. She is losing some hair. She has had no fever or symptoms of infection. She wakens frequently at night, but with Remus Loffler is able to go back to sleep quickly. Atarax and topical OTC steroid once or twice to periorbital areas did not seem helpful with that irritation. I have suggested we try cold packs across eyes during taxotere infusion to try to decrease blood flow then, and suggested trying the OTC topical steroid sparingly twice daily for 2-3 days around that treatment. She is not complaining of excessive lacrimation. She denies respiratory symptoms or problems with PAC. She denies abdominal or pelvic pain. Remainder of 10 point Review of Systems negative.  Objective:  Vital signs in last 24 hours:  BP 118/65  Pulse 95  Temp 98.8 F (37.1 C) (Oral)  Resp 20  Ht 5\' 3"  (1.6 m)  Wt 180 lb 8 oz (81.874 kg)  BMI 31.97 kg/m2  LMP 09/14/2011  Weight is down 5 lbs. Easily ambulatory, talkative, NAD. Husband very supportive  HEENT:PERRLA, sclera clear, anicteric and oropharynx clear, no lesions. No periorbital erythema or puffiness now. LymphaticsCervical, supraclavicular, and axillary nodes normal.No inguinal adenopathy Resp: clear to auscultation bilaterally and normal percussion bilaterally Cardio: irregular RR, clear  heart sounds GI: soft, non-tender; bowel sounds normal; no masses,  no organomegaly Extremities: extremities normal,  atraumatic, no cyanosis or edema. She has artificial nails in place. Neuro:no sensory deficits noted Skin without rash or ecchymosis Portacath-without erythema or tenderness  Lab Results:  Results for orders placed in visit on 08/31/12  CBC WITH DIFFERENTIAL      Component Value Range   WBC 13.5 (*) 3.9 - 10.3 10e3/uL   NEUT# 9.1 (*) 1.5 - 6.5 10e3/uL   HGB 11.8  11.6 - 15.9 g/dL   HCT 81.1  91.4 - 78.2 %   Platelets 352  145 - 400 10e3/uL   MCV 89.6  79.5 - 101.0 fL   MCH 29.3  25.1 - 34.0 pg   MCHC 32.7  31.5 - 36.0 g/dL   RBC 9.56  2.13 - 0.86 10e6/uL   RDW 16.5 (*) 11.2 - 14.5 %   lymph# 2.8  0.9 - 3.3 10e3/uL   MONO# 1.2 (*) 0.1 - 0.9 10e3/uL   Eosinophils Absolute 0.1  0.0 - 0.5 10e3/uL   Basophils Absolute 0.3 (*) 0.0 - 0.1 10e3/uL   NEUT% 67.2  38.4 - 76.8 %   LYMPH% 20.5  14.0 - 49.7 %   MONO% 9.1  0.0 - 14.0 %   EOS% 0.7  0.0 - 7.0 %   BASO% 2.5 (*) 0.0 - 2.0 %   nRBC 1 (*) 0 - 0 %  TECHNOLOGIST REVIEW      Component Value Range   Technologist Review 1 %nRBC, Rare meta       Studies/Results:  No results found.  Medications: I have reviewed the patient's current medications. Will add reglan AC/HS prn and use OTC topical steroid as above. Note her insurance has approved gemzar after appeal.  Assessment/Plan: 1.IA high grade leiomyosarcoma of uterus: day 1 cycle 2 gemzar today with day 8 gemzar + taxotere 1-10 and neulasta 09-09-11. Use ice packs to fingers and across eyes during taxotere infusions. 2.PAC in 3.atrial fibrillation and pacer in, on pradaxa 4.long and ongoing tobacco 5.HTN, asthma, borderline hypothyroid, chronic bursitis hips, spinal stenosis  Patient is in agreement with proceeding with cycle 2 treatment beginning today. She now prefers moving treatments to Mondays, so that "day 8" will be1-13-14 with neulasta 09-11-12.   LIVESAY,LENNIS P, MD   08/31/2012, 10:47 AM

## 2012-08-31 NOTE — Patient Instructions (Signed)
[facility] Discharge Instructions for Patients Receiving Chemotherapy  Today you received the following chemotherapy agents: Gemzar. To help prevent nausea and vomiting after your treatment, we encourage you to take your nausea medication, Zofran. Begin taking it tomorrow morning and take it as often as prescribed for the next 72 hours.   If you develop nausea and vomiting that is not controlled by your nausea medication, call the clinic. If it is after clinic hours your family physician or the after hours number for the clinic or go to the Emergency Department.   BELOW ARE SYMPTOMS THAT SHOULD BE REPORTED IMMEDIATELY:  *FEVER GREATER THAN 100.5 F  *CHILLS WITH OR WITHOUT FEVER  NAUSEA AND VOMITING THAT IS NOT CONTROLLED WITH YOUR NAUSEA MEDICATION  *UNUSUAL SHORTNESS OF BREATH  *UNUSUAL BRUISING OR BLEEDING  TENDERNESS IN MOUTH AND THROAT WITH OR WITHOUT PRESENCE OF ULCERS  *URINARY PROBLEMS  *BOWEL PROBLEMS  UNUSUAL RASH Items with * indicate a potential emergency and should be followed up as soon as possible.  Feel free to call the clinic you have any questions or concerns. The clinic phone number is [phone].   I have been informed and understand all the instructions given to me. I know to contact the clinic, my physician, or go to the Emergency Department if any problems should occur. I do not have any questions at this time, but understand that I may call the clinic during office hours   should I have any questions or need assistance in obtaining follow up care.

## 2012-08-31 NOTE — Telephone Encounter (Signed)
gv and printed appt schedule for pt for Jan and Feb...the patient aware °

## 2012-08-31 NOTE — Telephone Encounter (Signed)
S.w michelle to add chemo.Marland KitchenMarland KitchenAlso emailed

## 2012-08-31 NOTE — Patient Instructions (Signed)
Begin hydrocortisone topical to periorbital areas sparingly twice daily starting day prior to taxotere. We will try ice packs during taxotere infusion also.  We will add reglan (metoclopramide) before meals and bedtime to try to improve GI motility

## 2012-09-01 ENCOUNTER — Encounter: Payer: Self-pay | Admitting: Internal Medicine

## 2012-09-03 ENCOUNTER — Ambulatory Visit (INDEPENDENT_AMBULATORY_CARE_PROVIDER_SITE_OTHER): Payer: Medicare Other | Admitting: *Deleted

## 2012-09-03 DIAGNOSIS — I4892 Unspecified atrial flutter: Secondary | ICD-10-CM

## 2012-09-03 DIAGNOSIS — Z950 Presence of cardiac pacemaker: Secondary | ICD-10-CM

## 2012-09-03 LAB — REMOTE PACEMAKER DEVICE
AL THRESHOLD: 0.375 V
BAMS-0001: 175 {beats}/min
BATTERY VOLTAGE: 2.79 V
VENTRICULAR PACING PM: 0

## 2012-09-04 ENCOUNTER — Encounter: Payer: Self-pay | Admitting: *Deleted

## 2012-09-07 ENCOUNTER — Other Ambulatory Visit: Payer: Medicare Other | Admitting: Lab

## 2012-09-07 ENCOUNTER — Ambulatory Visit: Payer: Medicare Other

## 2012-09-08 ENCOUNTER — Ambulatory Visit: Payer: Medicare Other

## 2012-09-10 ENCOUNTER — Ambulatory Visit (HOSPITAL_BASED_OUTPATIENT_CLINIC_OR_DEPARTMENT_OTHER): Payer: Medicare Other

## 2012-09-10 ENCOUNTER — Other Ambulatory Visit (HOSPITAL_BASED_OUTPATIENT_CLINIC_OR_DEPARTMENT_OTHER): Payer: Medicare Other | Admitting: Lab

## 2012-09-10 ENCOUNTER — Other Ambulatory Visit: Payer: Medicare Other | Admitting: Lab

## 2012-09-10 VITALS — BP 119/49 | HR 77 | Temp 98.5°F | Resp 20

## 2012-09-10 DIAGNOSIS — Z5111 Encounter for antineoplastic chemotherapy: Secondary | ICD-10-CM

## 2012-09-10 DIAGNOSIS — C55 Malignant neoplasm of uterus, part unspecified: Secondary | ICD-10-CM

## 2012-09-10 LAB — CBC WITH DIFFERENTIAL/PLATELET
BASO%: 0 % (ref 0.0–2.0)
HCT: 33 % — ABNORMAL LOW (ref 34.8–46.6)
HGB: 10.9 g/dL — ABNORMAL LOW (ref 11.6–15.9)
MCHC: 33 g/dL (ref 31.5–36.0)
MONO#: 0.8 10*3/uL (ref 0.1–0.9)
NEUT%: 81.2 % — ABNORMAL HIGH (ref 38.4–76.8)
RDW: 16.6 % — ABNORMAL HIGH (ref 11.2–14.5)
WBC: 10.7 10*3/uL — ABNORMAL HIGH (ref 3.9–10.3)
lymph#: 1.2 10*3/uL (ref 0.9–3.3)
nRBC: 0 % (ref 0–0)

## 2012-09-10 MED ORDER — ONDANSETRON 16 MG/50ML IVPB (CHCC)
16.0000 mg | Freq: Once | INTRAVENOUS | Status: AC
  Administered 2012-09-10: 16 mg via INTRAVENOUS

## 2012-09-10 MED ORDER — DOCETAXEL CHEMO INJECTION 160 MG/16ML
100.0000 mg/m2 | Freq: Once | INTRAVENOUS | Status: AC
  Administered 2012-09-10: 190 mg via INTRAVENOUS
  Filled 2012-09-10: qty 19

## 2012-09-10 MED ORDER — SODIUM CHLORIDE 0.9 % IJ SOLN
10.0000 mL | INTRAMUSCULAR | Status: DC | PRN
  Administered 2012-09-10: 10 mL
  Filled 2012-09-10: qty 10

## 2012-09-10 MED ORDER — SODIUM CHLORIDE 0.9 % IV SOLN
900.0000 mg/m2 | Freq: Once | INTRAVENOUS | Status: AC
  Administered 2012-09-10: 1710 mg via INTRAVENOUS
  Filled 2012-09-10: qty 45

## 2012-09-10 MED ORDER — DEXAMETHASONE SODIUM PHOSPHATE 10 MG/ML IJ SOLN
20.0000 mg | Freq: Once | INTRAMUSCULAR | Status: AC
  Administered 2012-09-10: 20 mg via INTRAVENOUS

## 2012-09-10 MED ORDER — SODIUM CHLORIDE 0.9 % IV SOLN
Freq: Once | INTRAVENOUS | Status: AC
  Administered 2012-09-10: 14:00:00 via INTRAVENOUS

## 2012-09-10 MED ORDER — HEPARIN SOD (PORK) LOCK FLUSH 100 UNIT/ML IV SOLN
500.0000 [IU] | Freq: Once | INTRAVENOUS | Status: AC | PRN
  Administered 2012-09-10: 500 [IU]
  Filled 2012-09-10: qty 5

## 2012-09-10 NOTE — Patient Instructions (Addendum)
[facility] Discharge Instructions for Patients Receiving Chemotherapy  Today you received the following chemotherapy agents Gemzar and Taxotere.  To help prevent nausea and vomiting after your treatment, we encourage you to take your nausea medication as ordered per MD.    If you develop nausea and vomiting that is not controlled by your nausea medication, call the clinic. If it is after clinic hours your family physician or the after hours number for the clinic or go to the Emergency Department.   BELOW ARE SYMPTOMS THAT SHOULD BE REPORTED IMMEDIATELY:  *FEVER GREATER THAN 100.5 F  *CHILLS WITH OR WITHOUT FEVER  NAUSEA AND VOMITING THAT IS NOT CONTROLLED WITH YOUR NAUSEA MEDICATION  *UNUSUAL SHORTNESS OF BREATH  *UNUSUAL BRUISING OR BLEEDING  TENDERNESS IN MOUTH AND THROAT WITH OR WITHOUT PRESENCE OF ULCERS  *URINARY PROBLEMS  *BOWEL PROBLEMS  UNUSUAL RASH Items with * indicate a potential emergency and should be followed up as soon as possible.   Please let the nurse know about any problems that you may have experienced. Feel free to call the clinic you have any questions or concerns. The clinic phone number is [phone].   I have been informed and understand all the instructions given to me. I know to contact the clinic, my physician, or go to the Emergency Department if any problems should occur. I do not have any questions at this time, but understand that I may call the clinic during office hours   should I have any questions or need assistance in obtaining follow up care.    __________________________________________  _____________  __________ Signature of Patient or Authorized Representative            Date                   Time    __________________________________________ Nurse's Signature

## 2012-09-11 ENCOUNTER — Ambulatory Visit (HOSPITAL_BASED_OUTPATIENT_CLINIC_OR_DEPARTMENT_OTHER): Payer: Medicare Other

## 2012-09-11 VITALS — BP 116/63 | HR 84 | Temp 98.1°F

## 2012-09-11 DIAGNOSIS — C55 Malignant neoplasm of uterus, part unspecified: Secondary | ICD-10-CM

## 2012-09-11 MED ORDER — PEGFILGRASTIM INJECTION 6 MG/0.6ML
6.0000 mg | Freq: Once | SUBCUTANEOUS | Status: AC
  Administered 2012-09-11: 6 mg via SUBCUTANEOUS
  Filled 2012-09-11: qty 0.6

## 2012-09-12 ENCOUNTER — Other Ambulatory Visit: Payer: Self-pay | Admitting: Oncology

## 2012-09-12 DIAGNOSIS — C55 Malignant neoplasm of uterus, part unspecified: Secondary | ICD-10-CM

## 2012-09-16 ENCOUNTER — Other Ambulatory Visit: Payer: Self-pay | Admitting: Oncology

## 2012-09-17 ENCOUNTER — Telehealth: Payer: Self-pay | Admitting: Oncology

## 2012-09-17 NOTE — Telephone Encounter (Signed)
s/w pt and she is aware that the  1/28 appt will be cx       anne °

## 2012-09-19 ENCOUNTER — Encounter: Payer: Self-pay | Admitting: Oncology

## 2012-09-19 ENCOUNTER — Ambulatory Visit (HOSPITAL_BASED_OUTPATIENT_CLINIC_OR_DEPARTMENT_OTHER): Payer: Medicare Other | Admitting: Oncology

## 2012-09-19 ENCOUNTER — Other Ambulatory Visit (HOSPITAL_BASED_OUTPATIENT_CLINIC_OR_DEPARTMENT_OTHER): Payer: Medicare Other | Admitting: Lab

## 2012-09-19 ENCOUNTER — Telehealth: Payer: Self-pay | Admitting: Oncology

## 2012-09-19 ENCOUNTER — Telehealth: Payer: Self-pay | Admitting: *Deleted

## 2012-09-19 VITALS — BP 110/66 | HR 95 | Temp 97.9°F | Resp 18 | Ht 63.0 in | Wt 176.5 lb

## 2012-09-19 DIAGNOSIS — C55 Malignant neoplasm of uterus, part unspecified: Secondary | ICD-10-CM

## 2012-09-19 DIAGNOSIS — R634 Abnormal weight loss: Secondary | ICD-10-CM

## 2012-09-19 DIAGNOSIS — F17200 Nicotine dependence, unspecified, uncomplicated: Secondary | ICD-10-CM

## 2012-09-19 DIAGNOSIS — D6959 Other secondary thrombocytopenia: Secondary | ICD-10-CM

## 2012-09-19 LAB — CBC WITH DIFFERENTIAL/PLATELET
Basophils Absolute: 0.1 10*3/uL (ref 0.0–0.1)
Eosinophils Absolute: 0.1 10*3/uL (ref 0.0–0.5)
HGB: 10.6 g/dL — ABNORMAL LOW (ref 11.6–15.9)
LYMPH%: 14.7 % (ref 14.0–49.7)
MCH: 29.3 pg (ref 25.1–34.0)
MCV: 89.8 fL (ref 79.5–101.0)
MONO%: 10 % (ref 0.0–14.0)
NEUT#: 17.2 10*3/uL — ABNORMAL HIGH (ref 1.5–6.5)
Platelets: 72 10*3/uL — ABNORMAL LOW (ref 145–400)

## 2012-09-19 NOTE — Telephone Encounter (Signed)
Per staff message and POF I have scheduled appts.  JMW  

## 2012-09-19 NOTE — Telephone Encounter (Signed)
gv and printed appt scheduel for pt for Jan and feb....emailed michelle to add tx...the patient aware..per Harriett Sine gyn Dr. Duard Brady appt march 13th @ 10:00am...Marland KitchenMarland Kitchen

## 2012-09-19 NOTE — Patient Instructions (Signed)
Ice to periorbital areas just prior to, during and for 15-30 min after taxotere infusion, + at home if you want.  Ice packs to fingers during taxotere same as periorbital areas.  Saline nose spray every hour while awake if any bleeding or dry.  Hold pradaxa if any significant nosebleed or other bleeding and call if so.

## 2012-09-19 NOTE — Progress Notes (Signed)
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 She had cycle 2 gemzar/ taxotere on 1-3 and 09-10-12 ("day 8" moved to allow rx on Mondays) with neulasta 09-11-12. Cycle 3 will start 09-24-12, with four cycles anticipated.  Patient had new onset vaginal spotting in late Sept/ early Oct 2013, initially thought related to Pradaxa and use of OTC estrogen for hot flashes during summer. She was seen at Dr [name] office within 2-3 days, with ultrasound done (report not in this EMR) then [facility] with hysteroscopic myomectomy and polypectomy by [name] on 05-29-12, path [id number] with high grade leiomyosarcoma with >10 mitoses/ 10 HPF, 3.5 cm into outer half of myometrium, no LVSI. This pathology was also reviewed at [facility], findings confirmed. She was referred to [name], had CT CAP done in [facility] system 06-12-12 with 2 indeterminate left lung nodules largest 6 mm and 3 cm posterior uterine myometrial mass without apparent extrauterine extension or metastatic disease. She went to robotic assisted laparoscopic hysterectomy, bilateral salpingo-oophorectomy 06/15/12 at [facility] by [name] with findings of physiologic adhesions of right adnexum to appendix; small fibroid noted on uterus, otherwise normal abdominal survey; no evidence of extrauterine disease on evaluation. Pathology from the [facility] surgery [phone]) had high grade leiomyosarcoma with > 20 mitoses/10 HPF and invasion into outer half of myometrium, negative LVSI and benign ovaries/tubes. Gyn oncology has recommended gemzar/ taxotere, probably 4 cycles, this off study due to her high risk  pathology as the present randomized trial has observation arm.  Patient feels better today for first time since day 8 gemzar/taxotere on 09-10-12. Reglan has helped nausea and fullness, using this AC and HS ongoing. She used ice packs to eyes after she returned home from treatment, and we have discussed using these also during taxotere administration, as well as ice packs on hands due to nail effects of taxotere. She had slight epistaxis when blew nose today, no other bleeding. With platelets just 72k today, she will hold Pradaxa at least until we recheck CBC on 09-24-12 (ok to resume Pradaxa if plt >= 100k). Tastes are unpleasant tho she is eating and drinking some. She does not seem to have had fluid retention problems. PAC has functioned well. She denies increased shortness of breath. She had central chest discomfort just after taking decadron on empty stomach; we have discussed taking this with food and I have also recommended she be seen at ED if chest discomfort recurs. She does not sleep x 2 nights with decadron, which she agrees is probably preferable to possible fluid retention without decadron. Chronic LE and hip pain from spinal stenosis is better since steroids with chemo; she has some soreness upper back and is ok to f/u with chiropractor for this. Remainder of 10 point Review of Systems negative.  Objective:  Vital signs in last 24 hours:  BP 110/66  Pulse 95  Temp 97.9 F (36.6 C) (Oral)  Resp 18  Ht 5\' 3"  (1.6 m)  Wt 176 lb 8 oz (80.06 kg)  BMI 31.27 kg/m2  LMP 09/14/2011  Weight is down 4 lbs from 08-31-12.Easily mobile, respirations not labored RA, looks comfortable today.  HEENT:PERRLA, sclera clear, anicteric and oropharynx clear, no  lesions. Alopecia. Nasal turbinates with erythema bilaterally, no overt bleeding. No periorbital irritation, erythema or swelling obvious today. LymphaticsCervical, supraclavicular, and axillary nodes normal. No inguinal adenopathy Resp: clear to  auscultation bilaterally and normal percussion bilaterally. Breath sounds somewhat diminished bilaterally as previously Cardio: regular rate and rhythm, slow, pacer in. GI: soft, non-tender; bowel sounds normal; no masses,  no organomegaly Surgical incision well healed. Not distended. Not tender epigastrium. Extremities: extremities normal, atraumatic, no cyanosis or edema Neuro:no sensory deficits noted Portacath-without erythema Skin without ecchymosis or petechiae, no rash. Generally dry. Cannot see nails under polish etc. Lab Results:  Results for orders placed in visit on 09/19/12  CBC WITH DIFFERENTIAL      Component Value Range   WBC 23.2 (*) 3.9 - 10.3 10e3/uL   NEUT# 17.2 (*) 1.5 - 6.5 10e3/uL   HGB 10.6 (*) 11.6 - 15.9 g/dL   HCT 16.1 (*) 09.6 - 04.5 %   Platelets 72 (*) 145 - 400 10e3/uL   MCV 89.8  79.5 - 101.0 fL   MCH 29.3  25.1 - 34.0 pg   MCHC 32.6  31.5 - 36.0 g/dL   RBC 4.09 (*) 8.11 - 9.14 10e6/uL   RDW 17.6 (*) 11.2 - 14.5 %   lymph# 3.4 (*) 0.9 - 3.3 10e3/uL   MONO# 2.3 (*) 0.1 - 0.9 10e3/uL   Eosinophils Absolute 0.1  0.0 - 0.5 10e3/uL   Basophils Absolute 0.1  0.0 - 0.1 10e3/uL   NEUT% 74.1  38.4 - 76.8 %   LYMPH% 14.7  14.0 - 49.7 %   MONO% 10.0  0.0 - 14.0 %   EOS% 0.6  0.0 - 7.0 %   BASO% 0.6  0.0 - 2.0 %   nRBC 1 (*) 0 - 0 %   Last CMET (from [facility]) 08-31-12 noted.  Studies/Results:  No results found.  Medications: I have reviewed the patient's current medications. She will continue reglan, She will hold Pradaxa until platelets >=100k.  Assessment/Plan:  1.IA high grade leiomyosarcoma of uterus: day 1 cycle 3 gemzar 09-24-12 as long as ANC >=1.5 and plt >=100k, with day 8 gemzar + taxotere 2-3 and neulasta 10-02-12. Use ice packs to fingers and across eyes during taxotere infusions. I will see her ~ 2-14 prior to starting cycle 4 on 2-17, or sooner if needed. She will need appointment back to [name] in ~ March, likely with scans prior. 2.PAC in    3.atrial fibrillation and pacer in, on pradaxa chronically, which we will hold next few days due to thrombocytopenia from chemo.  4.long and ongoing tobacco  5.HTN, asthma, borderline hypothyroid, chronic bursitis hips, spinal stenosis 6.weight loss continuing: will ask dietician to assist  Patient and husband are comfortable with discussion and in agreement with plan above  LIVESAY,LENNIS P, MD   09/19/2012, 3:47 PM

## 2012-09-22 ENCOUNTER — Other Ambulatory Visit: Payer: Self-pay | Admitting: Oncology

## 2012-09-24 ENCOUNTER — Ambulatory Visit (HOSPITAL_BASED_OUTPATIENT_CLINIC_OR_DEPARTMENT_OTHER): Payer: Medicare Other

## 2012-09-24 ENCOUNTER — Other Ambulatory Visit (HOSPITAL_BASED_OUTPATIENT_CLINIC_OR_DEPARTMENT_OTHER): Payer: Medicare Other | Admitting: Lab

## 2012-09-24 ENCOUNTER — Other Ambulatory Visit: Payer: Medicare Other | Admitting: Lab

## 2012-09-24 DIAGNOSIS — C55 Malignant neoplasm of uterus, part unspecified: Secondary | ICD-10-CM

## 2012-09-24 DIAGNOSIS — Z5111 Encounter for antineoplastic chemotherapy: Secondary | ICD-10-CM

## 2012-09-24 LAB — CBC WITH DIFFERENTIAL/PLATELET
BASO%: 0.8 % (ref 0.0–2.0)
EOS%: 0.3 % (ref 0.0–7.0)
HCT: 31.2 % — ABNORMAL LOW (ref 34.8–46.6)
LYMPH%: 15.3 % (ref 14.0–49.7)
MCH: 29.6 pg (ref 25.1–34.0)
MCHC: 32.1 g/dL (ref 31.5–36.0)
MCV: 92.3 fL (ref 79.5–101.0)
MONO%: 7.1 % (ref 0.0–14.0)
NEUT%: 76.5 % (ref 38.4–76.8)
Platelets: 203 10*3/uL (ref 145–400)

## 2012-09-24 LAB — COMPREHENSIVE METABOLIC PANEL (CC13)
AST: 19 U/L (ref 5–34)
BUN: 10 mg/dL (ref 7.0–26.0)
Calcium: 8.9 mg/dL (ref 8.4–10.4)
Chloride: 106 mEq/L (ref 98–107)
Creatinine: 0.9 mg/dL (ref 0.6–1.1)

## 2012-09-24 MED ORDER — HEPARIN SOD (PORK) LOCK FLUSH 100 UNIT/ML IV SOLN
500.0000 [IU] | Freq: Once | INTRAVENOUS | Status: AC | PRN
  Administered 2012-09-24: 500 [IU]
  Filled 2012-09-24: qty 5

## 2012-09-24 MED ORDER — SODIUM CHLORIDE 0.9 % IJ SOLN
10.0000 mL | INTRAMUSCULAR | Status: DC | PRN
  Administered 2012-09-24: 10 mL
  Filled 2012-09-24: qty 10

## 2012-09-24 MED ORDER — DEXAMETHASONE SODIUM PHOSPHATE 10 MG/ML IJ SOLN
10.0000 mg | Freq: Once | INTRAMUSCULAR | Status: AC
  Administered 2012-09-24: 10 mg via INTRAVENOUS

## 2012-09-24 MED ORDER — ONDANSETRON 8 MG/50ML IVPB (CHCC)
8.0000 mg | Freq: Once | INTRAVENOUS | Status: AC
  Administered 2012-09-24: 8 mg via INTRAVENOUS

## 2012-09-24 MED ORDER — SODIUM CHLORIDE 0.9 % IV SOLN
Freq: Once | INTRAVENOUS | Status: AC
  Administered 2012-09-24: 13:00:00 via INTRAVENOUS

## 2012-09-24 MED ORDER — SODIUM CHLORIDE 0.9 % IV SOLN
900.0000 mg/m2 | Freq: Once | INTRAVENOUS | Status: AC
  Administered 2012-09-24: 1710 mg via INTRAVENOUS
  Filled 2012-09-24: qty 45

## 2012-09-24 NOTE — Patient Instructions (Signed)
[facility] Discharge Instructions for Patients Receiving Chemotherapy  Today you received the following chemotherapy agents Gemzar  If you develop nausea and vomiting that is not controlled by your nausea medication, call the clinic. If it is after clinic hours your family physician or the after hours number for the clinic or go to the Emergency Department.   BELOW ARE SYMPTOMS THAT SHOULD BE REPORTED IMMEDIATELY:  *FEVER GREATER THAN 100.5 F  *CHILLS WITH OR WITHOUT FEVER  NAUSEA AND VOMITING THAT IS NOT CONTROLLED WITH YOUR NAUSEA MEDICATION  *UNUSUAL SHORTNESS OF BREATH  *UNUSUAL BRUISING OR BLEEDING  TENDERNESS IN MOUTH AND THROAT WITH OR WITHOUT PRESENCE OF ULCERS  *URINARY PROBLEMS  *BOWEL PROBLEMS  UNUSUAL RASH Items with * indicate a potential emergency and should be followed up as soon as possible.  One of the nurses will contact you 24 hours after your treatment. Please let the nurse know about any problems that you may have experienced. Feel free to call the clinic you have any questions or concerns. The clinic phone number is [phone].   I have been informed and understand all the instructions given to me. I know to contact the clinic, my physician, or go to the Emergency Department if any problems should occur. I do not have any questions at this time, but understand that I may call the clinic during office hours   should I have any questions or need assistance in obtaining follow up care.    __________________________________________  _____________  __________ Signature of Patient or Authorized Representative            Date                   Time    __________________________________________ Nurse's Signature

## 2012-09-25 ENCOUNTER — Ambulatory Visit: Payer: Medicare Other

## 2012-09-29 ENCOUNTER — Other Ambulatory Visit: Payer: Self-pay | Admitting: Oncology

## 2012-10-01 ENCOUNTER — Other Ambulatory Visit: Payer: Self-pay | Admitting: *Deleted

## 2012-10-01 ENCOUNTER — Other Ambulatory Visit (HOSPITAL_BASED_OUTPATIENT_CLINIC_OR_DEPARTMENT_OTHER): Payer: Medicare Other | Admitting: Lab

## 2012-10-01 ENCOUNTER — Ambulatory Visit (HOSPITAL_BASED_OUTPATIENT_CLINIC_OR_DEPARTMENT_OTHER): Payer: Medicare Other

## 2012-10-01 ENCOUNTER — Other Ambulatory Visit: Payer: Medicare Other | Admitting: Lab

## 2012-10-01 ENCOUNTER — Ambulatory Visit: Payer: Medicare Other | Admitting: Nutrition

## 2012-10-01 VITALS — BP 122/68 | HR 77 | Temp 97.0°F

## 2012-10-01 DIAGNOSIS — Z5111 Encounter for antineoplastic chemotherapy: Secondary | ICD-10-CM

## 2012-10-01 DIAGNOSIS — C55 Malignant neoplasm of uterus, part unspecified: Secondary | ICD-10-CM

## 2012-10-01 LAB — CBC WITH DIFFERENTIAL/PLATELET
Basophils Absolute: 0 10*3/uL (ref 0.0–0.1)
Eosinophils Absolute: 0 10*3/uL (ref 0.0–0.5)
HCT: 29.4 % — ABNORMAL LOW (ref 34.8–46.6)
HGB: 9.4 g/dL — ABNORMAL LOW (ref 11.6–15.9)
MONO#: 0.1 10*3/uL (ref 0.1–0.9)
NEUT#: 2.9 10*3/uL (ref 1.5–6.5)
NEUT%: 70.3 % (ref 38.4–76.8)
WBC: 4.2 10*3/uL (ref 3.9–10.3)
lymph#: 1.1 10*3/uL (ref 0.9–3.3)

## 2012-10-01 MED ORDER — SODIUM CHLORIDE 0.9 % IV SOLN
Freq: Once | INTRAVENOUS | Status: AC
  Administered 2012-10-01: 14:00:00 via INTRAVENOUS

## 2012-10-01 MED ORDER — DEXAMETHASONE SODIUM PHOSPHATE 4 MG/ML IJ SOLN
20.0000 mg | Freq: Once | INTRAMUSCULAR | Status: AC
  Administered 2012-10-01: 20 mg via INTRAVENOUS

## 2012-10-01 MED ORDER — DOCETAXEL CHEMO INJECTION 160 MG/16ML
100.0000 mg/m2 | Freq: Once | INTRAVENOUS | Status: AC
  Administered 2012-10-01: 190 mg via INTRAVENOUS
  Filled 2012-10-01: qty 19

## 2012-10-01 MED ORDER — ONDANSETRON 16 MG/50ML IVPB (CHCC)
16.0000 mg | Freq: Once | INTRAVENOUS | Status: AC
  Administered 2012-10-01: 16 mg via INTRAVENOUS

## 2012-10-01 MED ORDER — SODIUM CHLORIDE 0.9 % IV SOLN
900.0000 mg/m2 | Freq: Once | INTRAVENOUS | Status: AC
  Administered 2012-10-01: 1710 mg via INTRAVENOUS
  Filled 2012-10-01: qty 45

## 2012-10-01 MED ORDER — METOCLOPRAMIDE HCL 10 MG PO TABS: 10.0000 mg | ORAL_TABLET | Freq: Four times a day (QID) | ORAL | Status: DC

## 2012-10-01 MED ORDER — SODIUM CHLORIDE 0.9 % IJ SOLN
10.0000 mL | INTRAMUSCULAR | Status: DC | PRN
  Administered 2012-10-01: 10 mL
  Filled 2012-10-01: qty 10

## 2012-10-01 MED ORDER — HEPARIN SOD (PORK) LOCK FLUSH 100 UNIT/ML IV SOLN
500.0000 [IU] | Freq: Once | INTRAVENOUS | Status: AC | PRN
  Administered 2012-10-01: 500 [IU]
  Filled 2012-10-01: qty 5

## 2012-10-01 NOTE — Progress Notes (Signed)
I received a consult from physician to speak with this 77 year old female diagnosed with uterine cancer receiving adjuvant chemotherapy. I attempted to talk with patient in chemotherapy room. Patient states "I don't need you.  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  And I have family who are oncologists." Patient reports she is not concerned with her weight loss. Records reflect patient has lost 35% of her usual body weight in the last year. (95.5 pounds) Patient states she has some taste alterations but she is dealing with it. I was able to provide a fact sheet on taste alterations and my contact information.  I've encouraged her call me if I can be of any help to her.

## 2012-10-01 NOTE — Telephone Encounter (Signed)
Spoke with patient in chemo, she has 1 more tx. States Reglan has helped so much! Plans to continue meds noted last office visit.

## 2012-10-01 NOTE — Patient Instructions (Addendum)
[facility] Discharge Instructions for Patients Receiving Chemotherapy  Today you received the following chemotherapy agents Gemzar/Taxotere  To help prevent nausea and vomiting after your treatment, we encourage you to take your nausea medication as prescribed.   If you develop nausea and vomiting that is not controlled by your nausea medication, call the clinic. If it is after clinic hours your family physician or the after hours number for the clinic or go to the Emergency Department.   BELOW ARE SYMPTOMS THAT SHOULD BE REPORTED IMMEDIATELY:  *FEVER GREATER THAN 100.5 F  *CHILLS WITH OR WITHOUT FEVER  NAUSEA AND VOMITING THAT IS NOT CONTROLLED WITH YOUR NAUSEA MEDICATION  *UNUSUAL SHORTNESS OF BREATH  *UNUSUAL BRUISING OR BLEEDING  TENDERNESS IN MOUTH AND THROAT WITH OR WITHOUT PRESENCE OF ULCERS  *URINARY PROBLEMS  *BOWEL PROBLEMS  UNUSUAL RASH Items with * indicate a potential emergency and should be followed up as soon as possible.  Feel free to call the clinic you have any questions or concerns. The clinic phone number is [phone].   I have been informed and understand all the instructions given to me. I know to contact the clinic, my physician, or go to the Emergency Department if any problems should occur. I do not have any questions at this time, but understand that I may call the clinic during office hours   should I have any questions or need assistance in obtaining follow up care.    __________________________________________  _____________  __________ Signature of Patient or Authorized Representative            Date                   Time    __________________________________________ Nurse's Signature

## 2012-10-02 ENCOUNTER — Ambulatory Visit (HOSPITAL_BASED_OUTPATIENT_CLINIC_OR_DEPARTMENT_OTHER): Payer: Medicare Other

## 2012-10-02 VITALS — BP 120/59 | HR 90 | Temp 98.3°F

## 2012-10-02 DIAGNOSIS — C55 Malignant neoplasm of uterus, part unspecified: Secondary | ICD-10-CM

## 2012-10-02 MED ORDER — PEGFILGRASTIM INJECTION 6 MG/0.6ML
6.0000 mg | Freq: Once | SUBCUTANEOUS | Status: AC
  Administered 2012-10-02: 6 mg via SUBCUTANEOUS
  Filled 2012-10-02: qty 0.6

## 2012-10-12 ENCOUNTER — Other Ambulatory Visit: Payer: Medicare Other

## 2012-10-12 ENCOUNTER — Other Ambulatory Visit: Payer: Medicare Other | Admitting: Lab

## 2012-10-12 ENCOUNTER — Other Ambulatory Visit: Payer: Self-pay

## 2012-10-12 ENCOUNTER — Ambulatory Visit (HOSPITAL_BASED_OUTPATIENT_CLINIC_OR_DEPARTMENT_OTHER): Payer: Medicare Other | Admitting: Oncology

## 2012-10-12 ENCOUNTER — Telehealth: Payer: Self-pay | Admitting: Oncology

## 2012-10-12 ENCOUNTER — Other Ambulatory Visit (HOSPITAL_BASED_OUTPATIENT_CLINIC_OR_DEPARTMENT_OTHER): Payer: Medicare Other | Admitting: Lab

## 2012-10-12 ENCOUNTER — Encounter: Payer: Self-pay | Admitting: Oncology

## 2012-10-12 VITALS — BP 117/59 | HR 77 | Temp 98.0°F | Resp 20 | Ht 63.0 in | Wt 180.2 lb

## 2012-10-12 DIAGNOSIS — D696 Thrombocytopenia, unspecified: Secondary | ICD-10-CM

## 2012-10-12 DIAGNOSIS — C55 Malignant neoplasm of uterus, part unspecified: Secondary | ICD-10-CM

## 2012-10-12 DIAGNOSIS — F17200 Nicotine dependence, unspecified, uncomplicated: Secondary | ICD-10-CM

## 2012-10-12 DIAGNOSIS — D649 Anemia, unspecified: Secondary | ICD-10-CM

## 2012-10-12 LAB — CBC WITH DIFFERENTIAL/PLATELET
Eosinophils Absolute: 0.1 10*3/uL (ref 0.0–0.5)
LYMPH%: 11.4 % — ABNORMAL LOW (ref 14.0–49.7)
MONO#: 2.9 10*3/uL — ABNORMAL HIGH (ref 0.1–0.9)
NEUT#: 26.7 10*3/uL — ABNORMAL HIGH (ref 1.5–6.5)
Platelets: 108 10*3/uL — ABNORMAL LOW (ref 145–400)
RBC: 2.78 10*6/uL — ABNORMAL LOW (ref 3.70–5.45)
WBC: 33.8 10*3/uL — ABNORMAL HIGH (ref 3.9–10.3)
lymph#: 3.8 10*3/uL — ABNORMAL HIGH (ref 0.9–3.3)
nRBC: 1 % — ABNORMAL HIGH (ref 0–0)

## 2012-10-12 MED ORDER — PANTOPRAZOLE SODIUM 40 MG PO TBEC: 40.0000 mg | DELAYED_RELEASE_TABLET | Freq: Every day | ORAL | Status: DC

## 2012-10-12 MED ORDER — LORAZEPAM 0.5 MG PO TABS: ORAL_TABLET | ORAL | Status: DC

## 2012-10-12 MED ORDER — FERROUS GLUCONATE 324 (38 FE) MG PO TABS: 324.0000 mg | ORAL_TABLET | Freq: Every day | ORAL | Status: DC

## 2012-10-12 NOTE — Patient Instructions (Signed)
We will call in protonix to begin 40 mg daily starting today, hoping this will let you take the decadron doses.  OK to take lasix twice daily with potassium twice daily thru weekend. We will recheck potassium with your chemo on Monday 2-17  Begin iron ~ 325 mg of ferrous fumarate or ferrous gluconate daily. Best absorption is to take this on empty stomach with OJ

## 2012-10-12 NOTE — Telephone Encounter (Signed)
Gave pt appt for lab, MD and chemo, also gave pt oral contrast for February and March 2014

## 2012-10-12 NOTE — Progress Notes (Signed)
OFFICE PROGRESS NOTE   10/12/2012   Physicians:Paola Gehrig, [name], [name] / [name], [name] / [name], [name], [name], [name] (podiatry), [name], [name]   INTERVAL HISTORY:  Patient is seen, together with husband, in continuing attention to adjuvant gemzar/taxotere chemotherapy for high grade uterine leiomyosarcoma. Cycle 3 was given 1-27 and 10-01-12 with neulasta on 10-02-12; four cycles are planned.  Patient had vaginal bleeding Sept/ early Oct 2013. She was seen at Dr [name] office within 2-3 days, with ultrasound done, then [facility] with hysteroscopic myomectomy and polypectomy by [name] on 05-29-12, path [id number] with high grade leiomyosarcoma with >10 mitoses/ 10 HPF, 3.5 cm into outer half of myometrium, no LVSI. This pathology was confirmed at [facility].  She was referred to [name], had CT CAP done in [facility] system 06-12-12 with 2 indeterminate left lung nodules largest 6 mm and 3 cm posterior uterine myometrial mass without apparent extrauterine extension or metastatic disease. She went to robotic assisted laparoscopic hysterectomy, bilateral salpingo-oophorectomy 06/15/12 at [facility] by [name] with findings of physiologic adhesions of right adnexum to appendix; small fibroid noted on uterus, otherwise normal abdominal survey; no evidence of extrauterine disease on evaluation. Pathology from the [facility] surgery [phone]) had high grade leiomyosarcoma with > 20 mitoses/10 HPF and invasion into outer half of myometrium, negative LVSI and benign ovaries/tubes. Gyn oncology recommended gemzar/ taxotere off study due to her high risk pathology. Day 1 cycle 1 was 08-06-2012.  Patient has had more fatigue since most recent treatment, tho denies SOB with limited activity. She has slight pink tinged mucous from nose in AMs, no other bleeding. She notices fluid retention in hands and LE despite usual lasix 40 mEq each AM. She took only 3  doses of decadron with most recent taxotere due to brief "throat spasm" after third dose. She denies frank GERD or epigastric pain. We have discussed use of steroids to prevent fluid retention with the taxotere. Hands are peeling since swelling has improved. Periorbital irritation has not been as bothersome and she is not noticing excessive lacrimation. Bowels are moving with effort. She has had no fever or symptoms of infection. She denies cough. She has cut back cigarettes to ~ 5/ 24 hours and tells me that she wants to stop. Remainder of 10 point Review of Systems negative.  Objective:  Vital signs in last 24 hours:  BP 117/59  Pulse 77  Temp(Src) 98 F (36.7 C) (Oral)  Resp 20  Ht 5\' 3"  (1.6 m)  Wt 180 lb 3.2 oz (81.738 kg)  BMI 31.93 kg/m2  LMP 09/14/2011 Weight is up 3.5 lbs. Ambulatory, less energetic affect than usual. Respirations not labored in exam room. NAD.   HEENT:PERRLA, sclera clear, anicteric and oropharynx clear, no lesions. Alopecia. No JVD. Makeup on but no periorbital puffiness or erythema obvious, no excessive lacrimation LymphaticsCervical, supraclavicular, and axillary nodes normal.No inguinal adenopathy Resp: clear to auscultation bilaterally and normal percussion bilaterally Cardio: regular rate and rhythm no gallop, clear heart sounds GI: soft, non-tender; bowel sounds normal; no masses,  no organomegaly not tender epigastrium Extremities: trace pedal edema bilaterally, no cords or tenderness Neuro:nonfocal Skin: dry desquamation fingers and palms bilaterally.Artificail nails so unable to assess nailbeds. Portacath-without erythema or tenderness  Lab Results:  Results for orders placed in visit on 10/12/12  CBC WITH DIFFERENTIAL      Result Value Range   WBC 33.8 (*) 3.9 - 10.3 10e3/uL   NEUT# 26.7 (*) 1.5 -  6.5 10e3/uL   HGB 8.3 (*) 11.6 - 15.9 g/dL   HCT 30.8 (*) 65.7 - 84.6 %   Platelets 108 (*) 145 - 400 10e3/uL   MCV 93.2  79.5 - 101.0 fL   MCH  29.9  25.1 - 34.0 pg   MCHC 32.0  31.5 - 36.0 g/dL   RBC 9.62 (*) 9.52 - 8.41 10e6/uL   RDW 19.5 (*) 11.2 - 14.5 %   lymph# 3.8 (*) 0.9 - 3.3 10e3/uL   MONO# 2.9 (*) 0.1 - 0.9 10e3/uL   Eosinophils Absolute 0.1  0.0 - 0.5 10e3/uL   Basophils Absolute 0.2 (*) 0.0 - 0.1 10e3/uL   NEUT% 79.1 (*) 38.4 - 76.8 %   LYMPH% 11.4 (*) 14.0 - 49.7 %   MONO% 8.6  0.0 - 14.0 %   EOS% 0.2  0.0 - 7.0 %   BASO% 0.7  0.0 - 2.0 %   nRBC 1 (*) 0 - 0 %   We have discussed PRBCs vs following CBC; she prefers to wait at least to repeat CBC with treatment next week.  CMET and iron studies will be drawn from [facility] with treatment next week.  Studies/Results:  CT CAP planned just prior to visit with [name] on 11-08-12  Medications: I have reviewed the patient's current medications. She will begin protonix 40 mg daily now, in hopes that this will allow her to take all doses of decadron around #4 taxotere. She will begin ferrous fumarate or gluconate on empty stomach with Vit C. Patient will increase lasix to bid for next 2-3 days and will increase potassium to bid with this.  Assessment/Plan:  1.IA high grade leiomyosarcoma of uterus: Patient prefers to proceed with cycle 4 next week (rather than delay a week due to plt just adequate/ lower Hgb and other symptoms). Will repeat CBC with other labs 10-15-12 and consider PRBCs if needed then or with day 8. I will see her on 2-21 prior to day 8 on 10-22-12. She will have repeat CT CAP on ~ 3-11 prior to visit back with [name] on 11-08-12. 2.PAC in  3.atrial fibrillation and pacer in, pradaxa still held anticipating drop in platelets with treatment upcoming  4.long and ongoing tobacco, which she is now trying to stop. Education done. 5.HTN, asthma, borderline hypothyroid, chronic bursitis hips, spinal stenosis   Missy Baksh P, MD   10/12/2012, 12:02 PM

## 2012-10-12 NOTE — Telephone Encounter (Signed)
Gave Ct order to Lilyan Punt for precert

## 2012-10-14 ENCOUNTER — Other Ambulatory Visit: Payer: Self-pay | Admitting: Oncology

## 2012-10-15 ENCOUNTER — Other Ambulatory Visit (HOSPITAL_BASED_OUTPATIENT_CLINIC_OR_DEPARTMENT_OTHER): Payer: Medicare Other | Admitting: Lab

## 2012-10-15 ENCOUNTER — Telehealth: Payer: Self-pay

## 2012-10-15 ENCOUNTER — Ambulatory Visit (HOSPITAL_BASED_OUTPATIENT_CLINIC_OR_DEPARTMENT_OTHER): Payer: Medicare Other

## 2012-10-15 DIAGNOSIS — C55 Malignant neoplasm of uterus, part unspecified: Secondary | ICD-10-CM

## 2012-10-15 DIAGNOSIS — Z5111 Encounter for antineoplastic chemotherapy: Secondary | ICD-10-CM

## 2012-10-15 LAB — COMPREHENSIVE METABOLIC PANEL (CC13)
ALT: 8 U/L (ref 0–55)
Alkaline Phosphatase: 104 U/L (ref 40–150)
Creatinine: 0.7 mg/dL (ref 0.6–1.1)
Glucose: 79 mg/dl (ref 70–99)
Sodium: 141 mEq/L (ref 136–145)
Total Bilirubin: 0.32 mg/dL (ref 0.20–1.20)
Total Protein: 6 g/dL — ABNORMAL LOW (ref 6.4–8.3)

## 2012-10-15 LAB — FERRITIN: Ferritin: 535 ng/mL — ABNORMAL HIGH (ref 10–291)

## 2012-10-15 LAB — CBC WITH DIFFERENTIAL/PLATELET
Basophils Absolute: 0.2 10*3/uL — ABNORMAL HIGH (ref 0.0–0.1)
HCT: 26.6 % — ABNORMAL LOW (ref 34.8–46.6)
HGB: 8.2 g/dL — ABNORMAL LOW (ref 11.6–15.9)
MONO#: 2.4 10*3/uL — ABNORMAL HIGH (ref 0.1–0.9)
NEUT#: 21 10*3/uL — ABNORMAL HIGH (ref 1.5–6.5)
NEUT%: 76.8 % (ref 38.4–76.8)
WBC: 27.3 10*3/uL — ABNORMAL HIGH (ref 3.9–10.3)
lymph#: 3.7 10*3/uL — ABNORMAL HIGH (ref 0.9–3.3)

## 2012-10-15 LAB — IRON AND TIBC
TIBC: 258 ug/dL (ref 250–470)
UIBC: 226 ug/dL (ref 125–400)

## 2012-10-15 MED ORDER — SODIUM CHLORIDE 0.9 % IV SOLN
Freq: Once | INTRAVENOUS | Status: AC
  Administered 2012-10-15: 13:00:00 via INTRAVENOUS

## 2012-10-15 MED ORDER — SODIUM CHLORIDE 0.9 % IJ SOLN
10.0000 mL | INTRAMUSCULAR | Status: DC | PRN
  Administered 2012-10-15: 10 mL
  Filled 2012-10-15: qty 10

## 2012-10-15 MED ORDER — HEPARIN SOD (PORK) LOCK FLUSH 100 UNIT/ML IV SOLN
500.0000 [IU] | Freq: Once | INTRAVENOUS | Status: AC | PRN
  Administered 2012-10-15: 500 [IU]
  Filled 2012-10-15: qty 5

## 2012-10-15 MED ORDER — SODIUM CHLORIDE 0.9 % IV SOLN
900.0000 mg/m2 | Freq: Once | INTRAVENOUS | Status: AC
  Administered 2012-10-15: 1710 mg via INTRAVENOUS
  Filled 2012-10-15: qty 45

## 2012-10-15 MED ORDER — ONDANSETRON 8 MG/50ML IVPB (CHCC)
8.0000 mg | Freq: Once | INTRAVENOUS | Status: AC
  Administered 2012-10-15: 8 mg via INTRAVENOUS

## 2012-10-15 MED ORDER — DEXAMETHASONE SODIUM PHOSPHATE 10 MG/ML IJ SOLN
10.0000 mg | Freq: Once | INTRAMUSCULAR | Status: AC
  Administered 2012-10-15: 10 mg via INTRAVENOUS

## 2012-10-15 NOTE — Telephone Encounter (Signed)
Ms. Amy Dominguez not symptomatic with Hgb at 8.2.  She declines transfusion at this time.  She knows to call if she becomes symptomatic prior to 10-19-12 appointment with Dr. Darrold Span. She began the oral iron as prescribed by Dr. Darrold Span.

## 2012-10-15 NOTE — Telephone Encounter (Signed)
Spoke to Amy Dominguez while in infusion regarding HGB at 8.2.  She denies sob at rest or with exertion.  She is just tired.  Told her that Dr. Darrold Span will review cbc, iron studies,  and chemistries drawn today. Amy Dominguez states that when she takes an extra lasix she takes an additional 20 meq of KCL= 40 meq a day as her usual dose is 20 meq daily.

## 2012-10-15 NOTE — Telephone Encounter (Signed)
Message copied by Lorine Bears on Mon Oct 15, 2012  4:04 PM ------      Message from: Jama Flavors P      Created: Mon Oct 15, 2012  1:56 PM       Labs seen and need follow up: she is for day 1 cycle 4 today. Please be sure she knows counts: platelets up well just in last few days. Hgb ~ stable. We can give 1 unit PRBCs this week if she is symptomatic now. ------

## 2012-10-15 NOTE — Patient Instructions (Addendum)
[facility] Discharge Instructions for Patients Receiving Chemotherapy  Today you received the following chemotherapy agents gemzar  To help prevent nausea and vomiting after your treatment, we encourage you to take your nausea medication  and take it as often as prescribed   If you develop nausea and vomiting that is not controlled by your nausea medication, call the clinic. If it is after clinic hours your family physician or the after hours number for the clinic or go to the Emergency Department.   BELOW ARE SYMPTOMS THAT SHOULD BE REPORTED IMMEDIATELY:  *FEVER GREATER THAN 100.5 F  *CHILLS WITH OR WITHOUT FEVER  NAUSEA AND VOMITING THAT IS NOT CONTROLLED WITH YOUR NAUSEA MEDICATION  *UNUSUAL SHORTNESS OF BREATH  *UNUSUAL BRUISING OR BLEEDING  TENDERNESS IN MOUTH AND THROAT WITH OR WITHOUT PRESENCE OF ULCERS  *URINARY PROBLEMS  *BOWEL PROBLEMS  UNUSUAL RASH Items with * indicate a potential emergency and should be followed up as soon as possible.  One of the nurses will contact you 24 hours after your treatment. Please let the nurse know about any problems that you may have experienced. Feel free to call the clinic you have any questions or concerns. The clinic phone number is [phone].   I have been informed and understand all the instructions given to me. I know to contact the clinic, my physician, or go to the Emergency Department if any problems should occur. I do not have any questions at this time, but understand that I may call the clinic during office hours   should I have any questions or need assistance in obtaining follow up care.    __________________________________________  _____________  __________ Signature of Patient or Authorized Representative            Date                   Time    __________________________________________ Nurse's Signature    

## 2012-10-16 ENCOUNTER — Encounter (HOSPITAL_COMMUNITY)
Admission: RE | Admit: 2012-10-16 | Discharge: 2012-10-16 | Disposition: A | Discharge status: Home / Self Care | Payer: Medicare Other | Source: Ambulatory Visit | Attending: Oncology | Admitting: Oncology

## 2012-10-16 ENCOUNTER — Ambulatory Visit (HOSPITAL_BASED_OUTPATIENT_CLINIC_OR_DEPARTMENT_OTHER): Payer: Medicare Other

## 2012-10-16 ENCOUNTER — Other Ambulatory Visit: Payer: Medicare Other | Admitting: Lab

## 2012-10-16 ENCOUNTER — Telehealth: Payer: Self-pay

## 2012-10-16 ENCOUNTER — Other Ambulatory Visit: Payer: Self-pay

## 2012-10-16 VITALS — BP 120/53 | HR 90 | Temp 97.0°F | Resp 18

## 2012-10-16 DIAGNOSIS — C55 Malignant neoplasm of uterus, part unspecified: Secondary | ICD-10-CM

## 2012-10-16 DIAGNOSIS — D649 Anemia, unspecified: Secondary | ICD-10-CM | POA: Insufficient documentation

## 2012-10-16 LAB — HOLD TUBE, BLOOD BANK

## 2012-10-16 MED ORDER — SODIUM CHLORIDE 0.9 % IV SOLN
250.0000 mL | Freq: Once | INTRAVENOUS | Status: AC
  Administered 2012-10-16: 250 mL via INTRAVENOUS

## 2012-10-16 MED ORDER — ACETAMINOPHEN 325 MG PO TABS
325.0000 mg | ORAL_TABLET | Freq: Once | ORAL | Status: AC
  Administered 2012-10-16: 325 mg via ORAL

## 2012-10-16 MED ORDER — HEPARIN SOD (PORK) LOCK FLUSH 100 UNIT/ML IV SOLN
500.0000 [IU] | Freq: Every day | INTRAVENOUS | Status: AC | PRN
  Administered 2012-10-16: 500 [IU]
  Filled 2012-10-16: qty 5

## 2012-10-16 MED ORDER — SODIUM CHLORIDE 0.9 % IJ SOLN
10.0000 mL | INTRAMUSCULAR | Status: AC | PRN
  Administered 2012-10-16: 10 mL
  Filled 2012-10-16: qty 10

## 2012-10-16 NOTE — Telephone Encounter (Signed)
Amy Dominguez is calling stating that she is sob this am and would like to come in for a blood transfusion today. Pt. Scheduled for 1145 for lab and transfusion ~1300.  Pt. Aware of appointment today.

## 2012-10-17 LAB — TYPE AND SCREEN: Unit division: 0

## 2012-10-19 ENCOUNTER — Encounter: Payer: Self-pay | Admitting: Oncology

## 2012-10-19 ENCOUNTER — Other Ambulatory Visit (HOSPITAL_BASED_OUTPATIENT_CLINIC_OR_DEPARTMENT_OTHER): Payer: Medicare Other | Admitting: Lab

## 2012-10-19 ENCOUNTER — Ambulatory Visit (HOSPITAL_BASED_OUTPATIENT_CLINIC_OR_DEPARTMENT_OTHER): Payer: Medicare Other | Admitting: Oncology

## 2012-10-19 VITALS — BP 100/55 | HR 93 | Temp 98.4°F | Resp 20 | Ht 63.0 in | Wt 180.9 lb

## 2012-10-19 DIAGNOSIS — F17200 Nicotine dependence, unspecified, uncomplicated: Secondary | ICD-10-CM

## 2012-10-19 DIAGNOSIS — I4891 Unspecified atrial fibrillation: Secondary | ICD-10-CM

## 2012-10-19 DIAGNOSIS — C55 Malignant neoplasm of uterus, part unspecified: Secondary | ICD-10-CM

## 2012-10-19 LAB — CBC WITH DIFFERENTIAL/PLATELET
BASO%: 0.7 % (ref 0.0–2.0)
Basophils Absolute: 0.1 10e3/uL (ref 0.0–0.1)
EOS%: 0.4 % (ref 0.0–7.0)
Eosinophils Absolute: 0 10e3/uL (ref 0.0–0.5)
HCT: 29.6 % — ABNORMAL LOW (ref 34.8–46.6)
HGB: 9.4 g/dL — ABNORMAL LOW (ref 11.6–15.9)
LYMPH%: 20.4 % (ref 14.0–49.7)
MCH: 29.7 pg (ref 25.1–34.0)
MCHC: 31.8 g/dL (ref 31.5–36.0)
MCV: 93.7 fL (ref 79.5–101.0)
MONO#: 0.1 10e3/uL (ref 0.1–0.9)
MONO%: 1.7 % (ref 0.0–14.0)
NEUT#: 5.9 10e3/uL (ref 1.5–6.5)
NEUT%: 76.8 % (ref 38.4–76.8)
Platelets: 306 10e3/uL (ref 145–400)
RBC: 3.16 10e6/uL — ABNORMAL LOW (ref 3.70–5.45)
RDW: 19.2 % — ABNORMAL HIGH (ref 11.2–14.5)
WBC: 7.7 10e3/uL (ref 3.9–10.3)
lymph#: 1.6 10e3/uL (ref 0.9–3.3)

## 2012-10-19 NOTE — Progress Notes (Signed)
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 She was transfused one unit of PRBCs on 10-16-12 due to symptomatic anemia, Hgb 8.2.  Patient had vaginal bleeding Sept/ early Oct 2013. She was seen at Dr [name] office within 2-3 days, with ultrasound done, then [facility] with hysteroscopic myomectomy and polypectomy by [name] on 05-29-12, path [id number] with high grade leiomyosarcoma with >10 mitoses/ 10 HPF, 3.5 cm into outer half of myometrium, no LVSI. This pathology was confirmed at [facility]. She was referred to [name], had CT CAP done in [facility] system 06-12-12 with 2 indeterminate left lung nodules largest 6 mm and 3 cm posterior uterine myometrial mass without apparent extrauterine extension or metastatic disease. She went to robotic assisted laparoscopic hysterectomy, bilateral salpingo-oophorectomy 06/15/12 at [facility] by [name] with findings of physiologic adhesions of right adnexum to appendix; small fibroid noted on uterus, otherwise normal abdominal survey; no evidence of extrauterine disease on evaluation. Pathology from the [facility] surgery [phone]) had high grade leiomyosarcoma with > 20 mitoses/10 HPF and invasion into outer half of myometrium, negative LVSI and benign ovaries/tubes. Gyn oncology recommended gemzar/ taxotere off study due to her high risk pathology. Day 1 cycle 1 was 08-06-2012.  Patient has felt a little better since PRBCs on 2-18, less SOB with ambulation. She is not SOB at rest, no increased cough. She has had frequent bowel movements which are not watery and no blood, at least x3 this AM; she has had no vomiting and is drinking fluids. She  held colace this AM and will hold reglan also for now. Her hands especially and also feet have peeled again since day 1 gemzar 10-15-12, with nailbeds not remarkable. She has had some erythema on forehead and cheeks without pruritis. She denies increased lacrimation and had no periorbital irritation using ice packs with last taxotere. She has had no bleeding, no fever. She does not have increased LE swelling. She has not had the "esophageal spasm" symptoms which she felt were related to oral decadron. Remainder of 10 point Review of Systems negative.  Objective:  Vital signs in last 24 hours:  BP 100/55  Pulse 93  Temp(Src) 98.4 F (36.9 C) (Oral)  Resp 20  Ht 5\' 3"  (1.6 m)  Wt 180 lb 14.4 oz (82.056 kg)  BMI 32.05 kg/m2  LMP 09/14/2011 Weight is up 0.5 lb.. Note BP has been lower, including systolic <100 when she was being transfused. Looks more comfortable today, more easily mobile, respirations not labored with activity in exam room.   HEENT:PERRLA, extra ocular movement intact, sclera clear, anicteric and oropharynx clear, no lesions complete alopecia. No excessive lacrimation. LymphaticsCervical, supraclavicular, and axillary nodes normal. No inguinal adenopathy Resp: clear to percussion, somewhat diminished BS as baseline but heard thruout no wheezes or crackles Cardio: irregular RR no gallop GI: soft, non-tender; bowel sounds normal; no masses,  no organomegaly Surgical incisions well healed. Extremities: hands with dry desquamating skin, no open areas, no apparent infection. Nail beds below artificial nails not remarkable Skin is pink and tender beneath confluent desquamating areas, which involve all of fingers and palms, minimal swelling of hands now. No pedal edema Neuro:fully alert and appropriate, no focal deficits Skin on hands as  above. Skin dry across upper back. No erythema face or periortibal areas apparent now. No ecchymoses or petechiae PAC-without erythema or  tenderness  Lab Results:  Results for orders placed in visit on 10/19/12  CBC WITH DIFFERENTIAL      Result Value Range   WBC 7.7  3.9 - 10.3 10e3/uL   NEUT# 5.9  1.5 - 6.5 10e3/uL   HGB 9.4 (*) 11.6 - 15.9 g/dL   HCT 16.1 (*) 09.6 - 04.5 %   Platelets 306  145 - 400 10e3/uL   MCV 93.7  79.5 - 101.0 fL   MCH 29.7  25.1 - 34.0 pg   MCHC 31.8  31.5 - 36.0 g/dL   RBC 4.09 (*) 8.11 - 9.14 10e6/uL   RDW 19.2 (*) 11.2 - 14.5 %   lymph# 1.6  0.9 - 3.3 10e3/uL   MONO# 0.1  0.1 - 0.9 10e3/uL   Eosinophils Absolute 0.0  0.0 - 0.5 10e3/uL   Basophils Absolute 0.1  0.0 - 0.1 10e3/uL   NEUT% 76.8  38.4 - 76.8 %   LYMPH% 20.4  14.0 - 49.7 %   MONO% 1.7  0.0 - 14.0 %   EOS% 0.4  0.0 - 7.0 %   BASO% 0.7  0.0 - 2.0 %    Iron studies were low on 10-15-12 with serum iron 32 and %sat 12 CMET 10-15-12  Normal with exception of total protein of 6 and alb 3.2  Studies/Results:  No results found.  Medications: I have reviewed the patient's current medications. She will hold lisinopril now and as long as systolic BP is <120; she will follow BP at home. She will continue protonix daily and use OTC steroid cream to hands 2-3x daily for now. She is taking oral iron. We have reviewed decadron 8 mg bid x 3 days beginning day prior to taxotere to lessen chance of fluid retention.  She appears to have dermatologic toxicity from gemzar affecting hands and feet, and has had periorbital symptoms and some peripheral fluid retention from taxotere. The diarrhea may be related to chemotherapy vs reglan/laxatives/stool softner. She is adamant that she does not want to delay day 8 (10-23-12) treatment for this last cycle. We will dose reduce gemzar by 50% and give taxotere as long as she has been able to take steroids beginning day prior (ice packs to eyes and hands with taxotere infusion).  I will see her back 10-29-12 or sooner if needed. She is already set up for CT CAP on 11-06-12 prior to seeing [name] on  11-08-12.  Assessment/Plan:  1.IA high grade leiomyosarcoma of uterus: course as above, for day 8 cycle 4 gemzar/ taxotere 10-23-12 as long as GI and skin problems are improved and she has been able to tolerate the premed decadron, gemzar decreased by 50% per recommended adjustments for nonheme toxicity. 2.PAC in  3.atrial fibrillation and pacer in, pradaxa has been held when thrombocytopenic  4.long and ongoing tobacco, which she is now trying to stop. Education done.  5.HTN, asthma, borderline hypothyroid, chronic bursitis hips, spinal stenosis   Patient hopes to finish chemo on time as she has continuing education history classes scheduled in March.  LIVESAY,LENNIS P, MD   10/19/2012, 5:30 PM

## 2012-10-19 NOTE — Patient Instructions (Signed)
No lisinopril unless BP systolic >120  Stop Reglan for now, as this may be aggravating the frequent bowel movements.

## 2012-10-22 ENCOUNTER — Ambulatory Visit (HOSPITAL_BASED_OUTPATIENT_CLINIC_OR_DEPARTMENT_OTHER): Payer: Medicare Other

## 2012-10-22 ENCOUNTER — Other Ambulatory Visit (HOSPITAL_BASED_OUTPATIENT_CLINIC_OR_DEPARTMENT_OTHER): Payer: Medicare Other | Admitting: Lab

## 2012-10-22 VITALS — BP 133/68 | HR 86 | Temp 98.0°F | Resp 20

## 2012-10-22 DIAGNOSIS — C55 Malignant neoplasm of uterus, part unspecified: Secondary | ICD-10-CM

## 2012-10-22 DIAGNOSIS — Z5111 Encounter for antineoplastic chemotherapy: Secondary | ICD-10-CM

## 2012-10-22 LAB — CBC WITH DIFFERENTIAL/PLATELET
Basophils Absolute: 0 10*3/uL (ref 0.0–0.1)
HCT: 30.6 % — ABNORMAL LOW (ref 34.8–46.6)
HGB: 9.8 g/dL — ABNORMAL LOW (ref 11.6–15.9)
MCH: 29.9 pg (ref 25.1–34.0)
MONO#: 0.1 10*3/uL (ref 0.1–0.9)
NEUT%: 76.5 % (ref 38.4–76.8)
lymph#: 1 10*3/uL (ref 0.9–3.3)

## 2012-10-22 MED ORDER — ONDANSETRON 16 MG/50ML IVPB (CHCC)
16.0000 mg | Freq: Once | INTRAVENOUS | Status: AC
  Administered 2012-10-22: 16 mg via INTRAVENOUS

## 2012-10-22 MED ORDER — SODIUM CHLORIDE 0.9 % IV SOLN
450.0000 mg/m2 | Freq: Once | INTRAVENOUS | Status: AC
  Administered 2012-10-22: 874 mg via INTRAVENOUS
  Filled 2012-10-22: qty 23.01

## 2012-10-22 MED ORDER — DEXAMETHASONE SODIUM PHOSPHATE 4 MG/ML IJ SOLN
20.0000 mg | Freq: Once | INTRAMUSCULAR | Status: AC
  Administered 2012-10-22: 20 mg via INTRAVENOUS

## 2012-10-22 MED ORDER — DOCETAXEL CHEMO INJECTION 160 MG/16ML
100.0000 mg/m2 | Freq: Once | INTRAVENOUS | Status: AC
  Administered 2012-10-22: 190 mg via INTRAVENOUS
  Filled 2012-10-22: qty 19

## 2012-10-22 MED ORDER — HEPARIN SOD (PORK) LOCK FLUSH 100 UNIT/ML IV SOLN
500.0000 [IU] | Freq: Once | INTRAVENOUS | Status: AC | PRN
  Administered 2012-10-22: 500 [IU]
  Filled 2012-10-22: qty 5

## 2012-10-22 MED ORDER — SODIUM CHLORIDE 0.9 % IJ SOLN
10.0000 mL | INTRAMUSCULAR | Status: DC | PRN
  Administered 2012-10-22: 10 mL
  Filled 2012-10-22: qty 10

## 2012-10-22 MED ORDER — SODIUM CHLORIDE 0.9 % IV SOLN
Freq: Once | INTRAVENOUS | Status: AC
  Administered 2012-10-22: 14:00:00 via INTRAVENOUS

## 2012-10-22 NOTE — Patient Instructions (Addendum)
[facility] Discharge Instructions for Patients Receiving Chemotherapy  Today you received the following chemotherapy agents :  Taxotere,  Gemzar.  To help prevent nausea and vomiting after your treatment, we encourage you to take your nausea medication as instructed by your physician.    If you develop nausea and vomiting that is not controlled by your nausea medication, call the clinic. If it is after clinic hours your family physician or the after hours number for the clinic or go to the Emergency Department.   BELOW ARE SYMPTOMS THAT SHOULD BE REPORTED IMMEDIATELY:  *FEVER GREATER THAN 100.5 F  *CHILLS WITH OR WITHOUT FEVER  NAUSEA AND VOMITING THAT IS NOT CONTROLLED WITH YOUR NAUSEA MEDICATION  *UNUSUAL SHORTNESS OF BREATH  *UNUSUAL BRUISING OR BLEEDING  TENDERNESS IN MOUTH AND THROAT WITH OR WITHOUT PRESENCE OF ULCERS  *URINARY PROBLEMS  *BOWEL PROBLEMS  UNUSUAL RASH Items with * indicate a potential emergency and should be followed up as soon as possible.  One of the nurses will contact you 24 hours after your treatment. Please let the nurse know about any problems that you may have experienced. Feel free to call the clinic you have any questions or concerns. The clinic phone number is [phone].   I have been informed and understand all the instructions given to me. I know to contact the clinic, my physician, or go to the Emergency Department if any problems should occur. I do not have any questions at this time, but understand that I may call the clinic during office hours   should I have any questions or need assistance in obtaining follow up care.    __________________________________________  _____________  __________ Signature of Patient or Authorized Representative            Date                   Time    __________________________________________ Nurse's Signature

## 2012-10-23 ENCOUNTER — Ambulatory Visit (HOSPITAL_BASED_OUTPATIENT_CLINIC_OR_DEPARTMENT_OTHER): Payer: Medicare Other

## 2012-10-23 VITALS — BP 120/62 | HR 65 | Temp 98.2°F

## 2012-10-23 DIAGNOSIS — C55 Malignant neoplasm of uterus, part unspecified: Secondary | ICD-10-CM

## 2012-10-23 MED ORDER — PEGFILGRASTIM INJECTION 6 MG/0.6ML
6.0000 mg | Freq: Once | SUBCUTANEOUS | Status: AC
  Administered 2012-10-23: 6 mg via SUBCUTANEOUS
  Filled 2012-10-23: qty 0.6

## 2012-10-29 ENCOUNTER — Ambulatory Visit (HOSPITAL_BASED_OUTPATIENT_CLINIC_OR_DEPARTMENT_OTHER): Payer: Medicare Other | Admitting: Oncology

## 2012-10-29 ENCOUNTER — Telehealth: Payer: Self-pay | Admitting: Oncology

## 2012-10-29 ENCOUNTER — Encounter: Payer: Self-pay | Admitting: Oncology

## 2012-10-29 ENCOUNTER — Other Ambulatory Visit (HOSPITAL_BASED_OUTPATIENT_CLINIC_OR_DEPARTMENT_OTHER): Payer: Medicare Other | Admitting: Lab

## 2012-10-29 VITALS — BP 154/70 | HR 70 | Temp 98.7°F | Resp 18 | Ht 63.0 in | Wt 177.5 lb

## 2012-10-29 DIAGNOSIS — R197 Diarrhea, unspecified: Secondary | ICD-10-CM

## 2012-10-29 DIAGNOSIS — C55 Malignant neoplasm of uterus, part unspecified: Secondary | ICD-10-CM

## 2012-10-29 DIAGNOSIS — D696 Thrombocytopenia, unspecified: Secondary | ICD-10-CM

## 2012-10-29 DIAGNOSIS — D6481 Anemia due to antineoplastic chemotherapy: Secondary | ICD-10-CM

## 2012-10-29 DIAGNOSIS — D509 Iron deficiency anemia, unspecified: Secondary | ICD-10-CM

## 2012-10-29 LAB — CBC WITH DIFFERENTIAL/PLATELET
Basophils Absolute: 0.1 10*3/uL (ref 0.0–0.1)
Eosinophils Absolute: 0 10*3/uL (ref 0.0–0.5)
HCT: 29.7 % — ABNORMAL LOW (ref 34.8–46.6)
HGB: 9.5 g/dL — ABNORMAL LOW (ref 11.6–15.9)
LYMPH%: 25.4 % (ref 14.0–49.7)
MCV: 93.7 fL (ref 79.5–101.0)
MONO%: 13.2 % (ref 0.0–14.0)
NEUT#: 4.9 10*3/uL (ref 1.5–6.5)
Platelets: 35 10*3/uL — ABNORMAL LOW (ref 145–400)

## 2012-10-29 MED ORDER — GABAPENTIN 100 MG PO CAPS: 100.0000 mg | ORAL_CAPSULE | Freq: Three times a day (TID) | ORAL | Status: DC

## 2012-10-29 NOTE — Telephone Encounter (Signed)
gv and printed appt schedule for pt for March and April °

## 2012-10-29 NOTE — Progress Notes (Signed)
OFFICE PROGRESS NOTE   10/29/2012   Physicians:Paola Gehrig, [name], [name] / [name], [name] / [name], [name], [name], [name] (podiatry), [name], [name]   INTERVAL HISTORY:  Patient is seen, together with husband, in continuing attention to adjuvant gemzar/taxotere chemotherapy for high grade uterine leiomyosarcoma, having had day 8 cycle 4 on 10-22-12 with neulasta on 10-23-12. Gemzar dose was decreased by 50% for last treatment only, due to skin and GI side effects. She has PAC in, flushed with Rx 10-22-12. She is for CT CAP on 11-06-12 and to see [name] on 11-08-12.  Patient was transfused 1 unit PRBCs on 10-16-12 for symptomatic anemia with Hgb 8.2. She is now on oral iron (ferrous gluconate).  Patient had vaginal bleeding Sept/ early Oct 2013. She was seen by gyn within 2-3 days, with ultrasound done, then [facility] with hysteroscopic myomectomy and polypectomy by [name] on 05-29-12, path [id number] with high grade leiomyosarcoma with >10 mitoses/ 10 HPF, 3.5 cm into outer half of myometrium, no LVSI. This pathology was confirmed at [facility]. She was referred to [name], had CT CAP done in [facility] system 06-12-12 with 2 indeterminate left lung nodules largest 6 mm and 3 cm posterior uterine myometrial mass without apparent extrauterine extension or metastatic disease. She went to robotic assisted laparoscopic hysterectomy, bilateral salpingo-oophorectomy 06/15/12 at [facility] by [name] with findings of physiologic adhesions of right adnexum to appendix; small fibroid noted on uterus, otherwise normal abdominal survey; no evidence of extrauterine disease on evaluation. Pathology from the [facility] surgery [phone]) had high grade leiomyosarcoma with > 20 mitoses/10 HPF and invasion into outer half of myometrium, negative LVSI and benign ovaries/tubes. Gyn oncology recommended gemzar/ taxotere off study due to her high risk pathology. Day 1 cycle 1 was  08-06-2012.  Patient "is not feeling better yet" from most recent treatment, with hands still swollen and painful tho not desquamating yet, feet less uncomfortable than hands, fatigue and decreased appetite. OxyIR was not helpful for hand discomfort.She is having minimal blood on tissues when she blows nose, no other bleeding or bruising. She is holding Pradaxa and will use acetaminophen instead of Excedrin while platelets are low. She is having loose stools, ~ 3x today; I would prefer to check C diff before using imodium if possible. She denies shortness of breath at rest or chest pain. She had headache ~ 3 days after day 8 treatment which was more generalized than her usual sinus HA, resolved within several hours. She has had no fever or symptoms of infection. She had no esophageal spasm symptoms with full dose decadron around this taxotere. She has one area of  Remainder of 10 point Review of Systems negative.  Patient would like to try neurontin for hands, tho generally that discomfort has improved in just a few days.   Objective:  Vital signs in last 24 hours:  BP 154/70  Pulse 70  Temp(Src) 98.7 F (37.1 C) (Oral)  Resp 18  Ht 5\' 3"  (1.6 m)  Wt 177 lb 8 oz (80.513 kg)  BMI 31.45 kg/m2  LMP 09/14/2011 Weight is down 2.5 lbs. Alert, ambulatory, moderately uncomfortable with hands, not dyspneic with activity in exam room.   HEENT:PERRLA, sclera clear, anicteric and oropharynx clear, no lesions. No periorbital swelling; no periorbital discoloration apparent (with makeup on). No obvious bleeding nasal turbinates bilaterally. LymphaticsCervical, supraclavicular, and axillary nodes normal. Resp: clear to auscultation bilaterally and normal percussion bilaterally Cardio: irregular RR GI: soft, some  normal BS, not distended, not tender. No appreciable HSM or mass. Extremities: no edema, cords, tenderness LE. Hands/ fingers swollen with blotchy erythema especially fingers, no desquamation or  skin breaks, not hot, no streaking above wrists, pulses intact. Palms without erythema. Artificial nails, nail bases not remarkable.  Neuro:nonfocal  Portacath-without erythema or tenderness  Lab Results:  Results for orders placed in visit on 10/29/12  CBC WITH DIFFERENTIAL      Result Value Range   WBC 8.1  3.9 - 10.3 10e3/uL   NEUT# 4.9  1.5 - 6.5 10e3/uL   HGB 9.5 (*) 11.6 - 15.9 g/dL   HCT 40.9 (*) 81.1 - 91.4 %   Platelets 35 (*) 145 - 400 10e3/uL   MCV 93.7  79.5 - 101.0 fL   MCH 30.0  25.1 - 34.0 pg   MCHC 32.0  31.5 - 36.0 g/dL   RBC 7.82 (*) 9.56 - 2.13 10e6/uL   RDW 18.6 (*) 11.2 - 14.5 %   lymph# 2.1  0.9 - 3.3 10e3/uL   MONO# 1.1 (*) 0.1 - 0.9 10e3/uL   Eosinophils Absolute 0.0  0.0 - 0.5 10e3/uL   Basophils Absolute 0.1  0.0 - 0.1 10e3/uL   NEUT% 60.3  38.4 - 76.8 %   LYMPH% 25.4  14.0 - 49.7 %   MONO% 13.2  0.0 - 14.0 %   EOS% 0.4  0.0 - 7.0 %   BASO% 0.7  0.0 - 2.0 %   nRBC 3 (*) 0 - 0 %    CMET 10-15-12 noted.  Iron studies low on 10-15-12  Studies/Results:  She is scheduled for CT CAP on 11-06-12  Medications: I have reviewed the patient's current medications. I have written neurontin at 100 mg tid (could increase dose if needed), which she understands might make her drowsy.  We have discussed thrombocytopenia on labs now. She will call if any increased bleeding, as she will need platelet transfusion if so. She agrees to repeat CBC on 11-01-12, will need platelets if <20k or bleeding; note needed PRBCs when Hgb 8.2 previously.  Assessment/Plan:   1.IA high grade leiomyosarcoma of uterus: Completed planned 4 cycles of gemzar/taxotere on 10-22-12, with gemzar decreased by 50% day 8 cycle 4 per recommendations for nonheme toxicity, tho skin reaction still significant especially hands now.  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  6. Anemia: iron deficiency and chemotherapy related: on oral ferrous gluconate. Will follow. Could certainly have IV iron if needed. 7.HTN, asthma, borderline hypothyroid, chronic bursitis hips, spinal stenosis    LIVESAY,LENNIS P, MD   10/29/2012, 2:22 PM

## 2012-10-30 ENCOUNTER — Other Ambulatory Visit: Payer: Self-pay | Admitting: Oncology

## 2012-10-30 ENCOUNTER — Encounter: Payer: Self-pay | Admitting: Oncology

## 2012-10-30 DIAGNOSIS — R197 Diarrhea, unspecified: Secondary | ICD-10-CM

## 2012-10-30 NOTE — Progress Notes (Unsigned)
[facility] END OF TREATMENT   Name: Amy Dominguez Date: 10/30/2012 MRN: [id number] DOB: 18-Nov-1935   TREATMENT DATES: 08-06-2012 thru 10-22-2012   REFERRING PHYSICIAN: [name]  DIAGNOSIS: uterine leiomyosarcoma  STAGE AT START OF TREATMENT:    INTENT:{CHL ONC [facility] INTENT WU:[id number]}   DRUGS OR REGIMENS GIVEN:   MAJOR TOXICITIES:   REASON TREATMENT STOPPED:   PERFORMANCE STATUS AT END:    ONGOING PROBLEMS:   FOLLOW UP PLANS:

## 2012-10-30 NOTE — Progress Notes (Signed)
[facility] END OF TREATMENT   Name: Amy Dominguez Date: 10/30/2012 MRN: [id number] DOB: 12/18/1935   TREATMENT DATES: 08-06-12 thru 10-22-12   REFERRING PHYSICIAN: [name]  DIAGNOSIS: uterine leiomyosarcoma  STAGE AT START OF TREATMENT: IA high grade    INTENT: curative   DRUGS OR REGIMENS GIVEN: gemzar/taxotere   MAJOR TOXICITIES: skin reaction hands > feet, anemia, thrombocytopenia, fatigue   REASON TREATMENT STOPPED: completion of planned course   PERFORMANCE STATUS AT END: 1   ONGOING PROBLEMS: swelling and soreness of hands, thrombocytopenia and mild anemia at visit 10-29-12.   FOLLOW UP PLANS:  Follow up CBC 11-01-12 CT CAP 11-06-12 Gyn oncology reevaluation 11-08-12    L.Darrold Span, MD

## 2012-10-31 ENCOUNTER — Other Ambulatory Visit (HOSPITAL_BASED_OUTPATIENT_CLINIC_OR_DEPARTMENT_OTHER): Payer: Medicare Other | Admitting: Lab

## 2012-10-31 ENCOUNTER — Telehealth: Payer: Self-pay | Admitting: *Deleted

## 2012-10-31 ENCOUNTER — Other Ambulatory Visit: Payer: Medicare Other

## 2012-10-31 DIAGNOSIS — C55 Malignant neoplasm of uterus, part unspecified: Secondary | ICD-10-CM

## 2012-10-31 LAB — CBC WITH DIFFERENTIAL/PLATELET
Basophils Absolute: 0.3 10*3/uL — ABNORMAL HIGH (ref 0.0–0.1)
EOS%: 0.3 % (ref 0.0–7.0)
HCT: 30.3 % — ABNORMAL LOW (ref 34.8–46.6)
HGB: 9.6 g/dL — ABNORMAL LOW (ref 11.6–15.9)
MCH: 30.1 pg (ref 25.1–34.0)
MCV: 95 fL (ref 79.5–101.0)
MONO%: 10.6 % (ref 0.0–14.0)
NEUT%: 77.7 % — ABNORMAL HIGH (ref 38.4–76.8)
lymph#: 3.6 10*3/uL — ABNORMAL HIGH (ref 0.9–3.3)

## 2012-10-31 NOTE — Telephone Encounter (Signed)
Called to speak with patient, she was sleeping, so spouse took message. Informed that platelets are looking better, but to continue to hold Pradaxa, we would be rechecking this next week. Mr Klimas verbalized understanding and will give message to patient.

## 2012-10-31 NOTE — Telephone Encounter (Signed)
Called and spoke to patient, C-diff testing negative. Patient states she has started some Imodium already today. Instructed patient that if this does not stop the diarrhea, she is to call us back for further evaluation.

## 2012-10-31 NOTE — Telephone Encounter (Signed)
Message copied by Kathlynn Grate on Wed Oct 31, 2012  5:06 PM ------      Message from: Jama Flavors P      Created: Wed Oct 31, 2012  3:57 PM       Labs seen and need follow AV:[id number] let her know platelets are better today, at 5. She still needs to hold pradaxa, but they are getting better. WBC up from nuelasta and Hgb ~ same 9.6. We will recheck CBC when she sees gyn onc next week. Call prior if increased bleeding or other problems.  (I believe we already let her know about C diff negative earlier today)            Cc LA, AM ------

## 2012-10-31 NOTE — Telephone Encounter (Signed)
Message copied by Laroy Apple E on Wed Oct 31, 2012  1:01 PM ------      Message from: Jama Flavors P      Created: Wed Oct 31, 2012 10:49 AM       Labs seen and need follow up: please let her know C diff negative. OK to use some imodium if still diarrhea ------

## 2012-11-06 ENCOUNTER — Encounter (HOSPITAL_COMMUNITY): Payer: Self-pay

## 2012-11-06 ENCOUNTER — Ambulatory Visit (HOSPITAL_COMMUNITY): Payer: Medicare Other

## 2012-11-06 ENCOUNTER — Ambulatory Visit (HOSPITAL_COMMUNITY)
Admission: RE | Admit: 2012-11-06 | Discharge: 2012-11-06 | Disposition: A | Discharge status: Home / Self Care | Payer: Medicare Other | Source: Ambulatory Visit | Attending: Oncology | Admitting: Oncology

## 2012-11-06 DIAGNOSIS — I700 Atherosclerosis of aorta: Secondary | ICD-10-CM | POA: Insufficient documentation

## 2012-11-06 DIAGNOSIS — N281 Cyst of kidney, acquired: Secondary | ICD-10-CM | POA: Insufficient documentation

## 2012-11-06 DIAGNOSIS — C55 Malignant neoplasm of uterus, part unspecified: Secondary | ICD-10-CM | POA: Insufficient documentation

## 2012-11-06 DIAGNOSIS — R918 Other nonspecific abnormal finding of lung field: Secondary | ICD-10-CM | POA: Insufficient documentation

## 2012-11-06 DIAGNOSIS — Z90710 Acquired absence of both cervix and uterus: Secondary | ICD-10-CM | POA: Insufficient documentation

## 2012-11-06 DIAGNOSIS — M4120 Other idiopathic scoliosis, site unspecified: Secondary | ICD-10-CM | POA: Insufficient documentation

## 2012-11-06 DIAGNOSIS — Z79899 Other long term (current) drug therapy: Secondary | ICD-10-CM | POA: Insufficient documentation

## 2012-11-06 DIAGNOSIS — I7090 Unspecified atherosclerosis: Secondary | ICD-10-CM | POA: Insufficient documentation

## 2012-11-06 DIAGNOSIS — I2510 Atherosclerotic heart disease of native coronary artery without angina pectoris: Secondary | ICD-10-CM | POA: Insufficient documentation

## 2012-11-06 DIAGNOSIS — Z950 Presence of cardiac pacemaker: Secondary | ICD-10-CM | POA: Insufficient documentation

## 2012-11-06 DIAGNOSIS — I708 Atherosclerosis of other arteries: Secondary | ICD-10-CM | POA: Insufficient documentation

## 2012-11-06 DIAGNOSIS — K7689 Other specified diseases of liver: Secondary | ICD-10-CM | POA: Insufficient documentation

## 2012-11-06 HISTORY — DX: Malignant (primary) neoplasm, unspecified: C80.1

## 2012-11-06 MED ORDER — IOHEXOL 300 MG/ML  SOLN
100.0000 mL | Freq: Once | INTRAMUSCULAR | Status: AC | PRN
  Administered 2012-11-06: 100 mL via INTRAVENOUS

## 2012-11-08 ENCOUNTER — Ambulatory Visit: Payer: Medicare Other | Attending: Gynecologic Oncology | Admitting: Gynecologic Oncology

## 2012-11-08 ENCOUNTER — Encounter: Payer: Self-pay | Admitting: Gynecologic Oncology

## 2012-11-08 ENCOUNTER — Other Ambulatory Visit (HOSPITAL_BASED_OUTPATIENT_CLINIC_OR_DEPARTMENT_OTHER): Payer: Medicare Other | Admitting: Lab

## 2012-11-08 VITALS — BP 110/58 | HR 84 | Temp 98.1°F | Resp 16 | Ht 61.77 in | Wt 179.4 lb

## 2012-11-08 DIAGNOSIS — Q619 Cystic kidney disease, unspecified: Secondary | ICD-10-CM | POA: Insufficient documentation

## 2012-11-08 DIAGNOSIS — C55 Malignant neoplasm of uterus, part unspecified: Secondary | ICD-10-CM | POA: Insufficient documentation

## 2012-11-08 DIAGNOSIS — Z79899 Other long term (current) drug therapy: Secondary | ICD-10-CM | POA: Insufficient documentation

## 2012-11-08 DIAGNOSIS — M7989 Other specified soft tissue disorders: Secondary | ICD-10-CM | POA: Insufficient documentation

## 2012-11-08 DIAGNOSIS — I10 Essential (primary) hypertension: Secondary | ICD-10-CM | POA: Insufficient documentation

## 2012-11-08 DIAGNOSIS — K7689 Other specified diseases of liver: Secondary | ICD-10-CM | POA: Insufficient documentation

## 2012-11-08 DIAGNOSIS — Z90710 Acquired absence of both cervix and uterus: Secondary | ICD-10-CM | POA: Insufficient documentation

## 2012-11-08 LAB — CBC WITH DIFFERENTIAL/PLATELET
Eosinophils Absolute: 0.1 10*3/uL (ref 0.0–0.5)
HCT: 29.1 % — ABNORMAL LOW (ref 34.8–46.6)
LYMPH%: 16 % (ref 14.0–49.7)
MCV: 94.6 fL (ref 79.5–101.0)
MONO%: 9.2 % (ref 0.0–14.0)
NEUT#: 12.1 10*3/uL — ABNORMAL HIGH (ref 1.5–6.5)
NEUT%: 73.3 % (ref 38.4–76.8)
Platelets: 267 10*3/uL (ref 145–400)
RBC: 3.07 10*6/uL — ABNORMAL LOW (ref 3.70–5.45)

## 2012-11-08 LAB — COMPREHENSIVE METABOLIC PANEL (CC13)
Alkaline Phosphatase: 82 U/L (ref 40–150)
BUN: 8.3 mg/dL (ref 7.0–26.0)
Creatinine: 1 mg/dL (ref 0.6–1.1)
Glucose: 143 mg/dl — ABNORMAL HIGH (ref 70–99)
Sodium: 142 mEq/L (ref 136–145)
Total Bilirubin: 0.48 mg/dL (ref 0.20–1.20)
Total Protein: 5.8 g/dL — ABNORMAL LOW (ref 6.4–8.3)

## 2012-11-08 NOTE — Patient Instructions (Signed)
Return to clinic in 6 months with a CT scan prior to her appointment.

## 2012-11-08 NOTE — Progress Notes (Signed)
Consult Note: Gyn-Onc  Amy Dominguez 77 y.o. female  CC:  Chief Complaint  Patient presents with  . Leiomyosarcoma of the uterus    Follow up    HPI:  Patient had vaginal bleeding Sept/ early Oct 2013. 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 This pathology was confirmed at [facility]. She had CT CAP done in [facility] system 06-12-12 with 2 indeterminate left lung nodules largest 6 mm and 3 cm posterior uterine myometrial mass without apparent extrauterine extension or metastatic disease. She went to robotic assisted laparoscopic hysterectomy, bilateral salpingo-oophorectomy 06/15/12 at [facility] with findings of physiologic adhesions of right adnexum to appendix; small fibroid noted on uterus, otherwise normal abdominal survey; no evidence of extrauterine disease on evaluation. Pathology from the [facility] surgery [phone]) had high grade leiomyosarcoma with > 20 mitoses/10 HPF and invasion into outer half of myometrium, negative LVSI and benign ovaries/tubes. Gyn oncology recommended gemzar/ taxotere off study due to her high risk pathology.   Day 1 cycle 1 was 08-06-2012 and having had day 8 cycle 4 on 10-22-12 with neulasta on 10-23-12. Gemzar dose was decreased by 50% for last treatment only, due to skin and GI side effects. She has PAC in, flushed with Rx 10-22-12. Patient was transfused 1 unit PRBCs on 10-16-12 for symptomatic anemia with Hgb 8.2. She is now on oral iron (ferrous gluconate).   Interval History:  CT CHEST, ABDOMEN AND PELVIS WITH CONTRAST  Technique: Multidetector CT imaging of the chest, abdomen and pelvis was performed following the standard protocol during bolus  administration of intravenous contrast.  Contrast: OMNIPAQUE IOHEXOL 300 MG/ML SOLN  Comparison: Prior  examinations 06/12/2012.  CT CHEST  Findings: There are no enlarged mediastinal or hilar lymph nodes. Right IJ Port-A-Cath tip is at the SVC right atrial junction.  Pacemaker leads are in place. There is diffuse atherosclerosis of the great vessels, aorta and coronary arteries. There is no pleural or pericardial effusion. 6 mm left lower lobe pulmonary nodule on image 43 is stable. There is a stable tiny left upper lobe nodule on image 9 which appears calcified. No new or enlarging nodules are identified.  IMPRESSION:  1. Stable small left lung nodules, likely benign based on stability.  2. No acute findings or evidence metastatic disease.  CT ABDOMEN AND PELVIS  Findings: There is a questionable ill-defined low density hepatic lesion measuring 8 mm on image 55. This is not clearly seen  previously although is quite subtle and not necessarily new. No other liver lesions are identified. The gallbladder is contracted. There is no biliary dilatation. The spleen, pancreas and adrenal glands appear normal. Low-density renal cysts bilaterally are unchanged. There is no hydronephrosis. The patient has undergone interval hysterectomy. There is no adnexal mass, fluid collection or adenopathy. The bladder appears unremarkable. Aorto iliac atherosclerosis appears stable. Mild thickening of the walls of the distal stomach on image 59 is  stable. Increased soft tissue density along the lateral wall of the ascending colon opposite the ileocecal valve is noted, best  seen on coronal image 43. Because this area appeared normal previously, this likely represents peristalsis and/or adherent stool. There is additional more typical peristalsis near the hepatic flexure. There are no worrisome osseous findings. There are degenerative changes of the lumbar spine associated with a  convex left scoliosis.  IMPRESSION:  1. No evidence of residual pelvic tumor or definite metastatic disease status post interval hysterectomy.  2.  Subtle liver lesion is not well seen previously, but not definitely new. In this patient with a pacemaker who cannot undergo MRI, I would suggest attention to this area on follow-up CT imaging.  3. Stable bilateral renal cyst.  4. Suggested new wall thickening of the ascending colon is likely technical and related to peristalsis and/or adherent stool having appeared normal previously.  Review of Systems: She's doing fairly well. She states her chemotherapy was much better than she had thought. She continues to have erythema in her hands but it's better. She does have some swelling in her hands as well as her feet and her skin is still peeling. She has no specific neuropathy however. She is looking forward to going to [place] for the summer. She will be there until September. She denies any pain. She does have occasional nausea with no emesis. She is overall quite fatigued and really feels only about 25% herself. She states that she did not get "chemo brain".  She said that she had a harder time recovering from this fourth cycle of chemotherapy. Her bladder is doing okay she's not required any stool softeners. She continues to have 2-3 soft stools per day. She states she's eating only about one half of what she normally would eat. Her taste buds or not the same. Denies any chest pain. She does have some shortness of breath due to anemia but that resolved after her blood transfusion. She denies any headaches visual changes unintentional weight loss weight gain headaches. She denies any vaginal bleeding. 10 point ROS is negative except as above.  Current Meds:  Outpatient Encounter Prescriptions as of 11/08/2012  Medication Sig Dispense Refill  . Calcium Carbonate-Vitamin D (CALCIUM 600 + D PO) Take 2 tablets by mouth 2 (two) times daily.        . cetirizine (ZYRTEC) 10 MG tablet Take 10 mg by mouth daily.      . Dabigatran Etexilate Mesylate (PRADAXA PO) Take 150 mg by mouth 2 (two) times daily.       Marland Kitchen  dexamethasone (DECADRON) 4 MG tablet Take 2 tabs twice a day x 3 days.  Begin day prior to Taxotere =08-12-12.  36 tablet  0  . docusate sodium (COLACE) 100 MG capsule Take 100 mg by mouth 2 (two) times daily.      . ferrous gluconate (FERGON) 324 MG tablet Take 1 tablet (324 mg total) by mouth daily with breakfast.  30 tablet  prn  . gabapentin (NEURONTIN) 100 MG capsule Take 1 capsule (100 mg total) by mouth 3 (three) times daily.  60 capsule  0  . hydrOXYzine (ATARAX/VISTARIL) 25 MG tablet Take 25 mg by mouth Every 6 hours as needed.      Marland Kitchen lisinopril (PRINIVIL,ZESTRIL) 20 MG tablet TAKE 1 TABLET BY MOUTH DAILY  90 tablet  0  . LORazepam (ATIVAN) 0.5 MG tablet Take 1/2 tab under tongue or swallow every 6 hours as needed for nausea  15 tablet  0  . Multiple Vitamins-Minerals (MULTIVITAMINS THER. W/MINERALS) TABS Take 1 tablet by mouth daily.        . ondansetron (ZOFRAN) 8 MG tablet TAKE 1 TO 2 TABLETS BY MOUTH EVERY 12 HOURS AS NEEDED FOR NAUSEA  30 tablet  0  . oxyCODONE (OXY IR/ROXICODONE) 5 MG immediate release tablet Take 5 mg by mouth every 4 (  four) hours as needed. For pain      . pantoprazole (PROTONIX) 40 MG tablet Take 1 tablet (40 mg total) by mouth daily.  30 tablet  1  . potassium chloride SA (K-DUR,KLOR-CON) 20 MEQ tablet Take 1 tablet (20 mEq total) by mouth daily.  90 tablet  3  . PRADAXA 150 MG CAPS TAKE ONE CAPSULE BY MOUTH EVERY 12 HOURS  60 capsule  1  . torsemide (DEMADEX) 20 MG tablet Take 1 tablet (20 mg total) by mouth daily.  30 tablet  12  . vitamin E 400 UNIT capsule Take 800 Units by mouth daily.      Marland Kitchen zolpidem (AMBIEN) 10 MG tablet Take 0.5-1 tablets (5-10 mg total) by mouth at bedtime as needed for sleep.  30 tablet  1  . metoCLOPramide (REGLAN) 10 MG tablet Take 1 tablet (10 mg total) by mouth 4 (four) times daily. Take 1 tab po  AC and  HS  To help with GI tract motility.  60 tablet  1   Facility-Administered Encounter Medications as of 11/08/2012  Medication Dose  Route Frequency Provider Last Rate Last Dose  . sodium chloride 0.9 % injection 10 mL  10 mL Intracatheter PRN Lennis Buzzy Han, MD        Allergy:  Allergies  Allergen Reactions  . Tylenol (Acetaminophen) Other (See Comments)    Brand name tylenol causes headache    Social Hx:   History   Social History  . Marital Status: Married    Spouse Name: N/A    Number of Children: N/A  . Years of Education: N/A   Occupational History  . Retired Charity fundraiser    Social History Main Topics  . Smoking status: Current Some Day Smoker -- 0.25 packs/day for 54 years    Types: Cigarettes  . Smokeless tobacco: Not on file  . Alcohol Use: 1.8 oz/week    3 Glasses of wine per week     Comment:  occasionally  . Drug Use: No  . Sexually Active: Not on file   Other Topics Concern  . Not on file   Social History Narrative   No added salt          Past Surgical Hx:  Past Surgical History  Procedure Laterality Date  . Polypectomy  2008     negative 2011,Dr Jarold Motto  . Hemorrhoid surgery    . Tonsillectomy    . Tubal ligation    . Insert / replace / remove pacemaker  DR [facility] pacemaker  09/2009  . Foot surgery      JOINT REPLACEMENT LT GREAT TOE   20 YRS AGO   . Cataract extraction      OS  . Lumbar laminectomy/decompression microdiscectomy  09/21/2011    Procedure: LUMBAR LAMINECTOMY/DECOMPRESSION MICRODISCECTOMY;  Surgeon: Tia Alert, MD;  Location: [facility];  Service: Neurosurgery;  Laterality: N/A;  lumbar laminectomy lumbar four-lumbar five  . Hysteroscopy w/d&c  05/29/2012    Procedure: DILATATION AND CURETTAGE /HYSTEROSCOPY;  Surgeon: Bennye Alm, MD;  Location: [facility];  Service: Gynecology;  Laterality: N/A;  W/ Resectascope  . Dilation and evacuation  05/29/2012    Procedure: DILATATION AND EVACUATION;  Surgeon: Bennye Alm, MD;  Location: [facility];  Service: Gynecology;  Laterality: N/A;    Past Medical Hx:  Past Medical History  Diagnosis Date  .  Arthralgia   . Spinal stenosis     spinal stenosis LS spine  .  Hypertension   . Asthma 2008 , 2012    RAD after BRONCHITIS    . Arthritis     hands, feet, hips, knees  . Headache     allergy related  . Complication of anesthesia     slow to respond  . Dysrhythmia     atrial fib-Medtronic Pacemaker-on pradaxa-off for surgery since 9.25 2013  . Symptomatic bradycardia 2011    Insertion of Medtronic Pacer  . Cancer     uterine leiomyosarcoma    Family Hx:  Family History  Problem Relation Age of Onset  . Stroke Mother     post valve replacement; warfarin missed  . Heart attack Father 60  . Breast cancer Sister   . Deep vein thrombosis Daughter     Factor S non functional    Vitals:  Blood pressure 110/58, pulse 84, temperature 98.1 F (36.7 C), resp. rate 16, height 5' 1.77" (1.569 m), weight 179 lb 6.4 oz (81.375 kg), last menstrual period 09/14/2011.  Physical Exam:  Well-nourished well-developed female in no acute distress.  Neck: Supple, no lymphadenopathy, no thyromegaly.  Cardiovascular: Regular rate rhythm.  Abdomen: Well-healed surgical incisions. There is no evidence of any incisional hernias. Abdomen is soft, nontender, there is no palpable mass or hepatosplenomegaly.  Groins: No lymphadenopathy.  Extremities: Some mild erythema and peeling on the dorsum of the hands. She has 1+ nonpitting edema equal bilaterally.  Pelvic: Normal external female genitalia. Vagina is atrophic. Vaginal cuff is intact there's no visible lesions. Bimanual examination reveals no masses or nodularity. Rectal confirms  Assessment/Plan:  77 year old with stage IA high-grade leiomyosarcoma with greater than 20 mitoses per 10 high power field 2 status post 4 cycles of adjuvant chemotherapy with gemcitabine and Taxotere. Posttreatment CT scan is very reassuring. There is a questionable subtle change in the liver and they recommend repeat scan which she will do in 6 months when she gets  back from [place]. We will continue to follow the pulmonary lesions expectantly. She will follow up with Dr. Darrold Span in 3 months return to see [place] in 6 months time. She has a copy of my card and knows to call us in August that we can schedule her CT scan as well as her appointment.  Dynastie Knoop A., MD 11/08/2012, 10:21 AM

## 2012-11-09 ENCOUNTER — Encounter: Payer: Self-pay | Admitting: Internal Medicine

## 2012-11-09 ENCOUNTER — Ambulatory Visit (INDEPENDENT_AMBULATORY_CARE_PROVIDER_SITE_OTHER): Payer: Medicare Other | Admitting: Internal Medicine

## 2012-11-09 ENCOUNTER — Telehealth: Payer: Self-pay | Admitting: Oncology

## 2012-11-09 VITALS — BP 119/55 | HR 64 | Ht 62.5 in | Wt 180.0 lb

## 2012-11-09 DIAGNOSIS — I4892 Unspecified atrial flutter: Secondary | ICD-10-CM

## 2012-11-09 DIAGNOSIS — Z950 Presence of cardiac pacemaker: Secondary | ICD-10-CM

## 2012-11-09 DIAGNOSIS — I359 Nonrheumatic aortic valve disorder, unspecified: Secondary | ICD-10-CM

## 2012-11-09 LAB — PACEMAKER DEVICE OBSERVATION
AL THRESHOLD: 0.5 V
ATRIAL PACING PM: 72
BAMS-0001: 175 {beats}/min
RV LEAD THRESHOLD: 1 V
VENTRICULAR PACING PM: 1

## 2012-11-09 NOTE — Assessment & Plan Note (Signed)
The patient's device was interrogated.  The information was reviewed. No changes were made in the programming.    

## 2012-11-09 NOTE — Patient Instructions (Addendum)
Remote monitoring is used to monitor your Pacemaker of ICD from home. This monitoring reduces the number of office visits required to check your device to one time per year. It allows us to keep an eye on the functioning of your device to ensure it is working properly. You are scheduled for a device check from home on 02-11-13. You may send your transmission at any time that day. If you have a wireless device, the transmission will be sent automatically. After your physician reviews your transmission, you will receive a postcard with your next transmission date.   Your physician wants you to follow-up in: ONE YEAR WITH DR KLEIN You will receive a reminder letter in the mail two months in advance. If you don't receive a letter, please call our office to schedule the follow-up appointment.    

## 2012-11-09 NOTE — Assessment & Plan Note (Signed)
Infrequent episodes are seen. Most of them appear to be sinus/atrial tachycardia with one-to-one AV ratio heart rates of about 160-180. I wonder whether it is related to chemotherapy as were recurring every month or so for the winter

## 2012-11-09 NOTE — Telephone Encounter (Signed)
Medical Oncology  Spoke with patient by phone re CBC from 11-08-12, with platelets up to 267k so fine to resume pradaxa. Hgb is stable at 9.6 and WBC coming down from last neulasta. She saw [name] after CT, saw Dr Graciela Husbands today, is coming off of meds including laxatives and reglan, used neurontin x 2 days without benefit for hands so stopped that, hands improving tho still peeling. Overall she feels progressively better. She is to see [name] next in Sept, will see me April 8 then plans to be out of state from May thru end of summer.   Ila Mcgill, MD

## 2012-11-09 NOTE — Progress Notes (Signed)
Patient Care Team: Pecola Lawless, MD as PCP - General   HPI  Amy Dominguez is a 77 y.o. female is seen in followup for pacemaker implantation for symptomatic sinus node dysfunction. She has a history of mild aortic stenosis.  Her dyspnea was much improved. She is able to climb a flight of stairs  major complaint is pain and aching in her thighs; there is none her shoulders  her thromboembolic risk factors are noted for hypertension age and gender for CHADS VASC score of 3   She is completing chemotherapy a uterine cancer that was stage I but  quite high grade; she feels like they're still residual effects of chemotherapy   Past Medical History  Diagnosis Date  . Arthralgia   . Spinal stenosis     spinal stenosis LS spine  . Hypertension   . Asthma 2008 , 2012    RAD after BRONCHITIS    . Arthritis     hands, feet, hips, knees  . Headache     allergy related  . Complication of anesthesia     slow to respond  . Dysrhythmia     atrial fib-Medtronic Pacemaker-on pradaxa-off for surgery since 9.25 2013  . Symptomatic bradycardia 2011    Insertion of Medtronic Pacer  . Cancer     uterine leiomyosarcoma    Past Surgical History  Procedure Laterality Date  . Polypectomy  2008     negative 2011,Dr Jarold Motto  . Hemorrhoid surgery    . Tonsillectomy    . Tubal ligation    . Insert / replace / remove pacemaker  DR [facility] pacemaker  09/2009  . Foot surgery      JOINT REPLACEMENT LT GREAT TOE   20 YRS AGO   . Cataract extraction      OS  . Lumbar laminectomy/decompression microdiscectomy  09/21/2011    Procedure: LUMBAR LAMINECTOMY/DECOMPRESSION MICRODISCECTOMY;  Surgeon: Tia Alert, MD;  Location: [facility];  Service: Neurosurgery;  Laterality: N/A;  lumbar laminectomy lumbar four-lumbar five  . Hysteroscopy w/d&c  05/29/2012    Procedure: DILATATION AND CURETTAGE /HYSTEROSCOPY;  Surgeon: Bennye Alm, MD;  Location: [facility];  Service: Gynecology;   Laterality: N/A;  W/ Resectascope  . Dilation and evacuation  05/29/2012    Procedure: DILATATION AND EVACUATION;  Surgeon: Bennye Alm, MD;  Location: [facility];  Service: Gynecology;  Laterality: N/A;    Current Outpatient Prescriptions  Medication Sig Dispense Refill  . cetirizine (ZYRTEC) 10 MG tablet Take 10 mg by mouth daily.      . ferrous gluconate (FERGON) 324 MG tablet Take 1 tablet (324 mg total) by mouth daily with breakfast.  30 tablet  prn  . lisinopril (PRINIVIL,ZESTRIL) 20 MG tablet TAKE 1 TABLET BY MOUTH DAILY  90 tablet  0  . oxyCODONE (OXY IR/ROXICODONE) 5 MG immediate release tablet Take 5 mg by mouth every 4 (four) hours as needed. For pain      . potassium chloride SA (K-DUR,KLOR-CON) 20 MEQ tablet Take 1 tablet (20 mEq total) by mouth daily.  90 tablet  3  . torsemide (DEMADEX) 20 MG tablet Take 1 tablet (20 mg total) by mouth daily.  30 tablet  12  . zolpidem (AMBIEN) 10 MG tablet Take 10 mg by mouth at bedtime as needed for sleep.      . Calcium Carbonate-Vitamin D (CALCIUM 600 + D PO) Take 2 tablets by mouth 2 (two) times daily.        Marland Kitchen  docusate sodium (COLACE) 100 MG capsule Take 100 mg by mouth 2 (two) times daily.      . Multiple Vitamins-Minerals (MULTIVITAMINS THER. W/MINERALS) TABS Take 1 tablet by mouth daily.        Marland Kitchen PRADAXA 150 MG CAPS TAKE ONE CAPSULE BY MOUTH EVERY 12 HOURS  60 capsule  1  . vitamin E 400 UNIT capsule Take 800 Units by mouth daily.      . [DISCONTINUED] furosemide (LASIX) 20 MG tablet Take 20 mg by mouth daily.         No current facility-administered medications for this visit.   Facility-Administered Medications Ordered in Other Visits  Medication Dose Route Frequency Provider Last Rate Last Dose  . sodium chloride 0.9 % injection 10 mL  10 mL Intracatheter PRN Lennis Buzzy Han, MD        Allergies  Allergen Reactions  . Tylenol (Acetaminophen) Other (See Comments)    Brand name tylenol causes headache    Review of Systems  negative except from HPI and PMH  Physical Exam BP 119/55  Pulse 64  Ht 5' 2.5" (1.588 m)  Wt 180 lb 0.6 oz (81.666 kg)  BMI 32.38 kg/m2  LMP 09/14/2011 Well developed and nourished in no acute distress HENT normal Neck supple with JVP-flat Clear  Device pocket well healed; without hematoma or erythema  Regular rate and rhythm, 2/6 murmur  Abd-soft with active BS No Clubbing cyanosis edema Skin-warm and dry some erythema exfoliation of her hands and A & Oriented  Grossly normal sensory and motor function   62-year-old refill motor and exam  Assessment and  Plan

## 2012-11-09 NOTE — Assessment & Plan Note (Signed)
Will anticipate repeat echo next year

## 2012-11-16 ENCOUNTER — Telehealth: Payer: Self-pay | Admitting: Dietician

## 2012-11-16 NOTE — Telephone Encounter (Signed)
Brief Outpatient Oncology Nutrition Note  Patient has been identified to be at risk on malnutrition screen.  Wt Readings from Last 10 Encounters:  11/09/12 180 lb 0.6 oz (81.666 kg)  11/08/12 179 lb 6.4 oz (81.375 kg)  10/29/12 177 lb 8 oz (80.513 kg)  10/19/12 180 lb 14.4 oz (82.056 kg)  10/12/12 180 lb 3.2 oz (81.738 kg)  09/19/12 176 lb 8 oz (80.06 kg)  08/31/12 180 lb 8 oz (81.874 kg)  08/10/12 185 lb 9.6 oz (84.188 kg)  07/17/12 183 lb 1.6 oz (83.054 kg)  07/12/12 183 lb 11.2 oz (83.326 kg)    Chart reviewed.  Dr. Darrold Span say patient 3/13.  At that time patient had finished last chemo treatment and was eating only about 50% of usual.  Called patient who stated that she was eating much better now-almost back to normal.  Patient to call if questions.  Oran Rein, RD, LDN

## 2012-11-26 ENCOUNTER — Other Ambulatory Visit: Payer: Self-pay | Admitting: Oncology

## 2012-11-26 DIAGNOSIS — C55 Malignant neoplasm of uterus, part unspecified: Secondary | ICD-10-CM

## 2012-11-26 DIAGNOSIS — Z85828 Personal history of other malignant neoplasm of skin: Secondary | ICD-10-CM

## 2012-11-27 ENCOUNTER — Telehealth: Payer: Self-pay | Admitting: Internal Medicine

## 2012-11-27 DIAGNOSIS — I10 Essential (primary) hypertension: Secondary | ICD-10-CM

## 2012-11-27 MED ORDER — POTASSIUM CHLORIDE CRYS ER 20 MEQ PO TBCR: EXTENDED_RELEASE_TABLET | ORAL | Status: DC

## 2012-11-27 MED ORDER — TORSEMIDE 20 MG PO TABS: ORAL_TABLET | ORAL | Status: DC

## 2012-11-27 NOTE — Telephone Encounter (Signed)
Pt having problem with meds, pls call

## 2012-11-27 NOTE — Telephone Encounter (Signed)
I spoke with the patient. She reports she is finishing chemo and is having a lot of fluid retention. Per the patient, Dr. Graciela Husbands told her she could take an extra dose of torsemide and potassium when she feels like she needs to. The patient needs an updated prescription to [facility] on [address]. I will send this in for torsemide 20 mg twice daily as directed and potassium 20 meq twice daily as directed. The patient would like a # 90 day supply.

## 2012-12-04 ENCOUNTER — Telehealth: Payer: Self-pay | Admitting: Oncology

## 2012-12-04 ENCOUNTER — Other Ambulatory Visit (HOSPITAL_BASED_OUTPATIENT_CLINIC_OR_DEPARTMENT_OTHER): Payer: Medicare Other | Admitting: Lab

## 2012-12-04 ENCOUNTER — Encounter: Payer: Self-pay | Admitting: Oncology

## 2012-12-04 ENCOUNTER — Ambulatory Visit (HOSPITAL_BASED_OUTPATIENT_CLINIC_OR_DEPARTMENT_OTHER): Payer: Medicare Other | Admitting: Oncology

## 2012-12-04 VITALS — BP 105/51 | HR 70 | Temp 97.4°F | Resp 18 | Ht 62.5 in | Wt 180.6 lb

## 2012-12-04 DIAGNOSIS — D6481 Anemia due to antineoplastic chemotherapy: Secondary | ICD-10-CM

## 2012-12-04 DIAGNOSIS — C55 Malignant neoplasm of uterus, part unspecified: Secondary | ICD-10-CM

## 2012-12-04 DIAGNOSIS — I4891 Unspecified atrial fibrillation: Secondary | ICD-10-CM

## 2012-12-04 DIAGNOSIS — T451X5A Adverse effect of antineoplastic and immunosuppressive drugs, initial encounter: Secondary | ICD-10-CM

## 2012-12-04 LAB — CBC WITH DIFFERENTIAL/PLATELET
Basophils Absolute: 0.1 10*3/uL (ref 0.0–0.1)
EOS%: 3.3 % (ref 0.0–7.0)
HCT: 32.9 % — ABNORMAL LOW (ref 34.8–46.6)
HGB: 10.4 g/dL — ABNORMAL LOW (ref 11.6–15.9)
MCH: 30.4 pg (ref 25.1–34.0)
MONO#: 0.6 10*3/uL (ref 0.1–0.9)
NEUT%: 53.5 % (ref 38.4–76.8)
Platelets: 177 10*3/uL (ref 145–400)
lymph#: 2.2 10*3/uL (ref 0.9–3.3)

## 2012-12-04 NOTE — Telephone Encounter (Signed)
Gave pt appt for May 2014 flush and labs only

## 2012-12-04 NOTE — Progress Notes (Signed)
OFFICE PROGRESS NOTE   12/04/2012   Physicians:Paola Gehrig, [name], [name] / [name], [name] / [name], [name], [name], [name] (podiatry), [name], [name]   INTERVAL HISTORY:   Patient is seen, together with husband, in scheduled follow up of her high grade uterine leiomyosarcoma, having received taxotere/gemzar from 08-06-12 thru 10-22-12. She is now on observation by gyn oncology and thru this office. She had CT CAP 11-06-12 and saw [name] on 11-08-12; plan is for repeat CT and visit back to [name] in ~ Sept, after she returns from summer in [place]. She still has PAC in, flushed with CT on 11-06-12. We will flush PAC again ~ May 6 before she leaves for [place] on May 18; she understands that this needs to be flushed q 6-8 weeks, which she will arrange thru local hospital ED or local physician while in [place].  Patient had vaginal bleeding Sept/ early Oct 2013. She was seen by gyn within 2-3 days, with ultrasound done, then [facility] with hysteroscopic myomectomy and polypectomy by [name] on 05-29-12, path [id number] with high grade leiomyosarcoma with >10 mitoses/ 10 HPF, 3.5 cm into outer half of myometrium, no LVSI. This pathology was confirmed at [facility] She was referred to [name], had CT CAP done in [facility] system 06-12-12 with 2 indeterminate left lung nodules largest 6 mm and 3 cm posterior uterine myometrial mass without apparent extrauterine extension or metastatic disease. She went to robotic assisted laparoscopic hysterectomy, bilateral salpingo-oophorectomy 06/15/12 at [facility] by [name] with findings of physiologic adhesions of right adnexum to appendix; small fibroid noted on uterus, otherwise normal abdominal survey; no evidence of extrauterine disease on evaluation. Pathology from the [facility] surgery [phone]) had high grade leiomyosarcoma with > 20 mitoses/10 HPF and invasion into outer half of myometrium, negative LVSI and benign  ovaries/tubes. Gyn oncology recommended gemzar/ taxotere off study due to her high risk pathology.  As above, 4 cycles of gemzar/taxotere were given from 08-06-12 thru 10-22-12, with dose reduction of gemzar due to skin toxicity and also with cytopenias which required PRBC transfusion and holding pradaxa in addition to neulasta.   Patient has felt gradually better since she has been off chemotherapy. Skin reaction has essentially resolved, with very little numbness persisting on tips of fingers. Appetite is good, taste is normal, energy much better, no bleeding, no fever or symptoms of infection. She has some environmental allergy symptoms, no lower respiratory symptoms, she and husband have both stopped smoking "4 times" since last chemo, presently off cigarettes and using nicotine patch. Still puritic area left cheek. She complains of tightness in thighs bilaterally "since last chemotherapy", worse in AMS, better thru day with activity, better with oxyIR in AM, intermittent swelling in lower legs only, not better with stretches or with chiropracter work on her back or accupuncture. She does not use NSAID due to pradaxa. She does not get as much diuresis from usual medications, increases furosemide occasionally as directed by Dr Graciela Husbands (with extra potassium). She denies low back pain with this. She feels this is different from previous bursitis symptoms. She may have some retinal problem, will see Dr Terrilee Croak with [facility] next week. She does not complain of increased lacrimation. Remainder of 10 point Review of Systems negative.  She saw Dr Graciela Husbands for pacer check recently. She has no scheduled appointments with Dr Alwyn Ren upcoming.  Objective:  Vital signs in last 24 hours:  BP 105/51  Pulse 70  Temp(Src) 97.4 F (36.3 C) (Oral)  Resp 18  Ht 5' 2.5" (1.588 m)  Wt 180 lb 9.6 oz (81.92 kg)  BMI 32.49 kg/m2  LMP 09/14/2011 Weight is up 3 lbs. More easily ambulatory, looks much  more comfortable.   HEENT:PERRLA, sclera clear, anicteric and oropharynx clear, no lesions Wearing wig LymphaticsCervical, supraclavicular, and axillary nodes normal.No inguinal adenopathy. Resp: diminished BS otherwise clear to A&P Cardio: regular rate and rhythm GI: soft, non-tender; bowel sounds normal; no masses,  no organomegaly Extremities: trace pedal edema to calves bilaterally without cords or tenderness No obvious swelling in upper legs. No tenderness to palpation thighs.  Neuro:no sensory deficits noted Skin dry thruout. Palms without erythema, desquamation or puffiness. Nothing apparent left cheek. Eyelashes growing back. Portacath/-without erythema or tenderness.  Lab Results:  Results for orders placed in visit on 12/04/12  CBC WITH DIFFERENTIAL      Result Value Range   WBC 6.7  3.9 - 10.3 10e3/uL   NEUT# 3.6  1.5 - 6.5 10e3/uL   HGB 10.4 (*) 11.6 - 15.9 g/dL   HCT 47.8 (*) 29.5 - 62.1 %   Platelets 177  145 - 400 10e3/uL   MCV 96.2  79.5 - 101.0 fL   MCH 30.4  25.1 - 34.0 pg   MCHC 31.6  31.5 - 36.0 g/dL   RBC 3.08 (*) 6.57 - 8.46 10e6/uL   RDW 14.6 (*) 11.2 - 14.5 %   lymph# 2.2  0.9 - 3.3 10e3/uL   MONO# 0.6  0.1 - 0.9 10e3/uL   Eosinophils Absolute 0.2  0.0 - 0.5 10e3/uL   Basophils Absolute 0.1  0.0 - 0.1 10e3/uL   NEUT% 53.5  38.4 - 76.8 %   LYMPH% 32.2  14.0 - 49.7 %   MONO% 9.5  0.0 - 14.0 %   EOS% 3.3  0.0 - 7.0 %   BASO% 1.5  0.0 - 2.0 %   nRBC 0  0 - 0 %    Last chemistries from 11-08-12 with K 4.1 and alb 3.1.  We will recheck CBC and Bmet from [facility] with flush 01-01-13  Studies/Results: CT CHEST 11-06-12 Findings: There are no enlarged mediastinal or hilar lymph nodes.  Right IJ Port-A-Cath tip is at the SVC right atrial junction.  Pacemaker leads are in place. There is diffuse atherosclerosis of  the great vessels, aorta and coronary arteries.  There is no pleural or pericardial effusion. 6 mm left lower lobe  pulmonary nodule on image 43 is  stable. There is a stable tiny  left upper lobe nodule on image 9 which appears calcified. No new  or enlarging nodules are identified.  IMPRESSION:  1. Stable small left lung nodules, likely benign based on  stability.  2. No acute findings or evidence metastatic disease.   CT ABDOMEN AND PELVIS 11-06-12 Findings: There is a questionable ill-defined low density hepatic  lesion measuring 8 mm on image 55. This is not clearly seen  previously although is quite subtle and not necessarily new. No  other liver lesions are identified. The gallbladder is contracted.  There is no biliary dilatation. The spleen, pancreas and adrenal  glands appear normal.  Low-density renal cysts bilaterally are unchanged. There is no  hydronephrosis.  The patient has undergone interval hysterectomy. There is no  adnexal mass, fluid collection or adenopathy. The bladder appears  unremarkable. Aorto iliac atherosclerosis appears stable.  Mild thickening of the walls of the distal stomach on image 59 is  stable. Increased soft tissue density along the lateral wall of  the ascending colon opposite the ileocecal valve is noted, best  seen on coronal image 43. Because this area appeared normal  previously, this likely represents peristalsis and/or adherent  stool. There is additional more typical peristalsis near the  hepatic flexure.  There are no worrisome osseous findings. There are degenerative  changes of the lumbar spine associated with a convex left  scoliosis.  IMPRESSION:  1. No evidence of residual pelvic tumor or definite metastatic  disease status post interval hysterectomy.  2. Subtle liver lesion is not well seen previously, but not  definitely new. In this patient with a pacemaker who cannot  undergo MRI, I would suggest attention to this area on follow-up CT  imaging.  3. Stable bilateral renal cyst.  4. Suggested new wall thickening of the ascending colon is likely  technical and related to  peristalsis and/or adherent stool having  appeared normal previously.  I have reviewed the CT AP. She does have atherosclerotic vascular changes, but nothing to cause the thigh discomfort that I can tell.   Medications: I have reviewed the patient's current medications.  We have discussed removing PAC vs keeping it flushed for now, and she prefers to keep it in and do flushes, which would be my preference also  She does not want lymphedema PT evaluation and does not want an appointment with Dr Alwyn Ren to discuss LE symptoms. Possibly her lasix is not working as well as previously because her albumin is still somewhat low. She will try to increase protein in diet back to pretreatment amounts.  She will let us know when she is back in [place] after the summer, so that we can set up PAC flushes here. She will probably see [name] with repeat scans in Sept, then I will see her after that visit (not scheduled yet)  Assessment/Plan:  1.IA high grade leiomyosarcoma of uterus: Completed planned 4 cycles of gemzar/taxotere on 10-22-12. No known disease now, tho pulmonary nodules and vague area in liver will be followed up with next scans. 2.blood counts improving 3.PAC in: will flush here May 6, then after she returns to [facility] late summer. Flushed in interim will be arranged by patient in [place]. 4.atrial fibrillation and pacer in, back on pradaxa  5.actively trying to stop smoking, using nicotine patch. Husband has also stopped smoking now. 6. Anemia: iron deficiency and chemotherapy related: on oral ferrous gluconate. Will follow with repeat CBC when PAC is flushed May 6  7.HTN, asthma, borderline hypothyroid, chronic bursitis hips, spinal stenosis 8. Complaints of soreness in thighs: hopefully this will continue to improve along with other symptoms. Patient will increase protein in diet and talk again with chiropracter.  Patient and husband understood conversation and expressed appreciation for  care.  Time spent 25 min, >50% in discussion   LIVESAY,LENNIS P, MD   12/04/2012, 10:47 AM

## 2012-12-04 NOTE — Patient Instructions (Signed)
We will flush portacath and check CBC and K+ ~ May 6. You should have it flushed every 6-8 weeks from there. Please call Dr Precious Reel office when you get back to [place], to let us know when this was flushed last, and we can set up appointments from there

## 2012-12-08 ENCOUNTER — Other Ambulatory Visit: Payer: Self-pay | Admitting: Internal Medicine

## 2012-12-15 ENCOUNTER — Other Ambulatory Visit: Payer: Self-pay | Admitting: Internal Medicine

## 2012-12-17 ENCOUNTER — Encounter: Payer: Self-pay | Admitting: Emergency Medicine

## 2013-01-01 ENCOUNTER — Other Ambulatory Visit (HOSPITAL_BASED_OUTPATIENT_CLINIC_OR_DEPARTMENT_OTHER): Payer: Medicare Other | Admitting: Lab

## 2013-01-01 ENCOUNTER — Ambulatory Visit: Payer: Medicare Other

## 2013-01-01 VITALS — BP 114/62 | HR 87 | Temp 97.7°F | Resp 20

## 2013-01-01 DIAGNOSIS — C55 Malignant neoplasm of uterus, part unspecified: Secondary | ICD-10-CM

## 2013-01-01 LAB — BASIC METABOLIC PANEL (CC13)
Chloride: 103 mEq/L (ref 98–107)
Potassium: 4.4 mEq/L (ref 3.5–5.1)
Sodium: 139 mEq/L (ref 136–145)

## 2013-01-01 LAB — CBC WITH DIFFERENTIAL/PLATELET
Basophils Absolute: 0.1 10*3/uL (ref 0.0–0.1)
Eosinophils Absolute: 0.1 10*3/uL (ref 0.0–0.5)
HGB: 12.2 g/dL (ref 11.6–15.9)
LYMPH%: 32.4 % (ref 14.0–49.7)
MCV: 89.3 fL (ref 79.5–101.0)
MONO%: 8.4 % (ref 0.0–14.0)
NEUT#: 4.7 10*3/uL (ref 1.5–6.5)
NEUT%: 56.9 % (ref 38.4–76.8)
Platelets: 202 10*3/uL (ref 145–400)

## 2013-01-01 MED ORDER — SODIUM CHLORIDE 0.9 % IJ SOLN
10.0000 mL | INTRAMUSCULAR | Status: DC | PRN
  Administered 2013-01-01: 10 mL via INTRAVENOUS
  Filled 2013-01-01: qty 10

## 2013-01-01 MED ORDER — HEPARIN SOD (PORK) LOCK FLUSH 100 UNIT/ML IV SOLN
500.0000 [IU] | Freq: Once | INTRAVENOUS | Status: AC
  Administered 2013-01-01: 500 [IU] via INTRAVENOUS
  Filled 2013-01-01: qty 5

## 2013-01-02 ENCOUNTER — Other Ambulatory Visit: Payer: Self-pay | Admitting: *Deleted

## 2013-01-02 ENCOUNTER — Telehealth: Payer: Self-pay

## 2013-01-02 DIAGNOSIS — C549 Malignant neoplasm of corpus uteri, unspecified: Secondary | ICD-10-CM

## 2013-01-02 NOTE — Telephone Encounter (Signed)
Told Amy Dominguez that her Hgb. as good at 12.2 yesterday and KCL was 4.4  Per Dr. Darrold Span.

## 2013-01-02 NOTE — Telephone Encounter (Signed)
Set up appointment  On 05-27-13 with Dr.Livesay for  follow up.  Ms. Facile wanted to set up appointment now as she is going out of town for the summer. Lab to be done 05-20-13 prior to CT scan  And visit with Dr. Duard Brady.

## 2013-01-15 ENCOUNTER — Telehealth: Payer: Self-pay

## 2013-01-15 NOTE — Telephone Encounter (Signed)
The Infusion Lab at [facility] will not accept the prescription  from Dr. Darrold Span  for [facility] flush. Called infusion lab [phone].  Spoke with Corrie Dandy.   She will fax form for [facility] flush to be filled out for a summer resident by Dr. Darrold Span. The form needs to be faxed back to the number listed on the form.

## 2013-01-17 NOTE — Telephone Encounter (Signed)
Faxed  completed [facility] order form for Portacath Flush with copies of ct and fluroscopy notes for placement to [phone].  Infusion Department [phone]. Sent a copy to HIM to be scanned into patient's chart. Notified patient's husband that the form was faxed back to [facility].

## 2013-02-11 ENCOUNTER — Ambulatory Visit (INDEPENDENT_AMBULATORY_CARE_PROVIDER_SITE_OTHER): Payer: Medicare Other | Admitting: *Deleted

## 2013-02-11 DIAGNOSIS — Z950 Presence of cardiac pacemaker: Secondary | ICD-10-CM

## 2013-02-11 DIAGNOSIS — I4892 Unspecified atrial flutter: Secondary | ICD-10-CM

## 2013-02-13 ENCOUNTER — Encounter: Payer: Self-pay | Admitting: Internal Medicine

## 2013-02-13 ENCOUNTER — Other Ambulatory Visit: Payer: Self-pay | Admitting: *Deleted

## 2013-02-13 MED ORDER — DABIGATRAN ETEXILATE MESYLATE 150 MG PO CAPS: ORAL_CAPSULE | ORAL | Status: DC

## 2013-02-14 ENCOUNTER — Telehealth: Payer: Self-pay

## 2013-02-14 NOTE — Telephone Encounter (Signed)
Ms. Facile received letter regarding Dr. Darrold Span  Leaving [facility].  She is at her home in [place]. For the summer.  Please call.  She has a quick question. Gave a copy of this phone note to Jule Ser to return patient's call.

## 2013-02-26 LAB — REMOTE PACEMAKER DEVICE
ATRIAL PACING PM: 81
BAMS-0001: 175 {beats}/min
RV LEAD THRESHOLD: 0.5 V

## 2013-03-07 ENCOUNTER — Encounter: Payer: Self-pay | Admitting: *Deleted

## 2013-03-12 ENCOUNTER — Telehealth: Payer: Self-pay | Admitting: Hematology and Oncology

## 2013-03-12 NOTE — Telephone Encounter (Signed)
LMONVM ADVISING THE PT OF HER LAB AND HER MD APPTS IN SEPT.

## 2013-03-13 ENCOUNTER — Other Ambulatory Visit: Payer: Self-pay | Admitting: *Deleted

## 2013-03-13 MED ORDER — LISINOPRIL 20 MG PO TABS: ORAL_TABLET | ORAL | Status: DC

## 2013-03-13 MED ORDER — DABIGATRAN ETEXILATE MESYLATE 150 MG PO CAPS: ORAL_CAPSULE | ORAL | Status: DC

## 2013-05-20 ENCOUNTER — Other Ambulatory Visit (HOSPITAL_BASED_OUTPATIENT_CLINIC_OR_DEPARTMENT_OTHER): Payer: Medicare Other

## 2013-05-20 DIAGNOSIS — C55 Malignant neoplasm of uterus, part unspecified: Secondary | ICD-10-CM

## 2013-05-20 DIAGNOSIS — C549 Malignant neoplasm of corpus uteri, unspecified: Secondary | ICD-10-CM

## 2013-05-21 ENCOUNTER — Ambulatory Visit (HOSPITAL_COMMUNITY)
Admission: RE | Admit: 2013-05-21 | Discharge: 2013-05-21 | Disposition: A | Discharge status: Home / Self Care | Payer: Medicare Other | Source: Ambulatory Visit | Attending: Gynecologic Oncology | Admitting: Gynecologic Oncology

## 2013-05-21 ENCOUNTER — Ambulatory Visit (HOSPITAL_COMMUNITY): Payer: Medicare Other

## 2013-05-21 DIAGNOSIS — Z90710 Acquired absence of both cervix and uterus: Secondary | ICD-10-CM | POA: Insufficient documentation

## 2013-05-21 DIAGNOSIS — I2510 Atherosclerotic heart disease of native coronary artery without angina pectoris: Secondary | ICD-10-CM | POA: Insufficient documentation

## 2013-05-21 DIAGNOSIS — I700 Atherosclerosis of aorta: Secondary | ICD-10-CM | POA: Insufficient documentation

## 2013-05-21 DIAGNOSIS — M47814 Spondylosis without myelopathy or radiculopathy, thoracic region: Secondary | ICD-10-CM | POA: Insufficient documentation

## 2013-05-21 DIAGNOSIS — R918 Other nonspecific abnormal finding of lung field: Secondary | ICD-10-CM | POA: Insufficient documentation

## 2013-05-21 DIAGNOSIS — N281 Cyst of kidney, acquired: Secondary | ICD-10-CM | POA: Insufficient documentation

## 2013-05-21 DIAGNOSIS — C549 Malignant neoplasm of corpus uteri, unspecified: Secondary | ICD-10-CM | POA: Insufficient documentation

## 2013-05-21 MED ORDER — IOHEXOL 300 MG/ML  SOLN
100.0000 mL | Freq: Once | INTRAMUSCULAR | Status: AC | PRN
  Administered 2013-05-21: 100 mL via INTRAVENOUS

## 2013-05-22 ENCOUNTER — Other Ambulatory Visit: Payer: Self-pay | Admitting: *Deleted

## 2013-05-22 ENCOUNTER — Telehealth: Payer: Self-pay | Admitting: Internal Medicine

## 2013-05-22 DIAGNOSIS — I10 Essential (primary) hypertension: Secondary | ICD-10-CM

## 2013-05-22 MED ORDER — TORSEMIDE 20 MG PO TABS: 20.0000 mg | ORAL_TABLET | Freq: Every day | ORAL | Status: DC

## 2013-05-22 NOTE — Telephone Encounter (Signed)
Patient stated that her prescription said to take twice daily as directed - but she states that she only takes it once a day. I explained that this changed in April when she called in with fluid retention and Dr. Graciela Husbands advised her to take an extra pill if needed. She states that she has not had to take an extra pill since that time and requested it be changed back to the original once a day, and she would let us know if she had any other fluid retention issues. I discontinued current order and placed original order for once a day. Patient agreeable to plan.

## 2013-05-22 NOTE — Telephone Encounter (Signed)
New Problem:  Pt states she needs clarification on her Tourosimide Rx. Please advise

## 2013-05-23 ENCOUNTER — Ambulatory Visit: Payer: Medicare Other | Attending: Gynecologic Oncology | Admitting: Gynecologic Oncology

## 2013-05-23 ENCOUNTER — Encounter: Payer: Self-pay | Admitting: Gynecologic Oncology

## 2013-05-23 DIAGNOSIS — Z7901 Long term (current) use of anticoagulants: Secondary | ICD-10-CM | POA: Insufficient documentation

## 2013-05-23 DIAGNOSIS — Z79899 Other long term (current) drug therapy: Secondary | ICD-10-CM | POA: Insufficient documentation

## 2013-05-23 DIAGNOSIS — Z90710 Acquired absence of both cervix and uterus: Secondary | ICD-10-CM | POA: Insufficient documentation

## 2013-05-23 DIAGNOSIS — Z9221 Personal history of antineoplastic chemotherapy: Secondary | ICD-10-CM | POA: Insufficient documentation

## 2013-05-23 DIAGNOSIS — Z9079 Acquired absence of other genital organ(s): Secondary | ICD-10-CM | POA: Insufficient documentation

## 2013-05-23 DIAGNOSIS — I4891 Unspecified atrial fibrillation: Secondary | ICD-10-CM | POA: Insufficient documentation

## 2013-05-23 DIAGNOSIS — I10 Essential (primary) hypertension: Secondary | ICD-10-CM | POA: Insufficient documentation

## 2013-05-23 DIAGNOSIS — C549 Malignant neoplasm of corpus uteri, unspecified: Secondary | ICD-10-CM

## 2013-05-23 DIAGNOSIS — R918 Other nonspecific abnormal finding of lung field: Secondary | ICD-10-CM | POA: Insufficient documentation

## 2013-05-23 NOTE — Progress Notes (Signed)
Consult Note: Gyn-Onc  Amy Dominguez 77 y.o. female  CC:  Chief Complaint  Patient presents with  . Leiomyosarcoma    Follow up visit     HPI: Patient had vaginal bleeding Sept/ early Oct 2013. She was seen by gyn within 2-3 days, with ultrasound done, then [facility] with hysteroscopic myomectomy and polypectomy by [name] on 05-29-12, pathology with high grade leiomyosarcoma with >10 mitoses/ 10 HPF, 3.5 cm into outer half of myometrium, no LVSI. This pathology was confirmed at [facility]. She had CT CAP done in [place] system 06-12-12 with 2 indeterminate left lung nodules largest 6 mm and 3 cm posterior uterine myometrial mass without apparent extrauterine extension or metastatic disease. She went to robotic assisted laparoscopic hysterectomy, bilateral salpingo-oophorectomy 06/15/12 at [facility] with findings of physiologic adhesions of right adnexum to appendix; small fibroid noted on uterus, otherwise normal abdominal survey; no evidence of extrauterine disease on evaluation. Pathology from the [facility] surgery [phone]) had high grade leiomyosarcoma with > 20 mitoses/10 HPF and invasion into outer half of myometrium, negative LVSI and benign ovaries/tubes. Gyn oncology recommended gemzar/ taxotere off study due to her high risk pathology. 4 cycles of gemzar/taxotere were given from 08-06-12 thru 10-22-12, with dose reduction of gemzar due to skin toxicity and also with cytopenias which required PRBC transfusion and holding pradaxa in addition to neulasta.    Interval History:  I last saw her in March of this year she was seen by Dr. Darrold Span in April. At that time CT scan from March showed no pelvic tumor, there is a similar lesion that was not previously seen and a stable small left lung nodule. As recommended she'll follow up in 6 months. She is CT scan September 23 that revealed:  COMPARISON: 11/06/2012  FINDINGS:  CT CHEST FINDINGS  No pleural effusion identified. Tiny left upper lobe nodule is  stable measuring 2 mm, image number 8/series 5. The left lung base nodule is unchanged measuring 5 mm, image 41/ series 5. No new or enlarging pulmonary nodules or masses identified. No airspace consolidation noted. The heart size is normal. No pericardial effusion identified. Calcifications are noted within the LAD and left circumflex coronary artery. No enlarged mediastinal or hilar lymph nodes identified.  Review of the visualized osseous structures is significant for mild multilevel thoracic spondylosis. No aggressive lytic or sclerotic bone lesions.   CT ABDOMEN AND PELVIS FINDINGS  Subtle, 4 mm low attenuation structure within the right hepatic lobe is unchanged from previous exam. The gallbladder appears normal. No biliary dilatation. Normal appearance of the pancreas. The spleen is negative. Normal appearance of both adrenal glands. The left kidney stress set there are bilateral renal cysts identified. Urinary bladder appears normal. Previous hysterectomy. Calcified atherosclerotic disease affects the abdominal aorta. No aneurysm. No upper abdominal adenopathy identified. No pelvic or inguinal adenopathy noted. No free fluid or abnormal fluid collections identified within the abdomen or pelvis. The stomach appears normal. The small bowel loops are within normal limits. Normal appearance of the colon. Review of the visualized osseous structures is on unremarkable. No aggressive lytic or sclerotic bone lesions identified.  IMPRESSION:  CT CHEST IMPRESSION  1. Stable small pulmonary nodules. No new findings identified.  CT ABDOMEN AND PELVIS IMPRESSION  1. The previously referenced, subtle low attenuation structure in the right hepatic lobe is unchanged from previous exam.  2. No specific evidence of recurrent pelvic tumor or metastatic disease.  Doing quite well. She states that the next issue she needs  to take care of her as her back pain. She's also having right knee issues and this can be due  for a cortisone injection. She was to put off any replacement long she can. She had some issues with her port the summer. When she was in [place] she went to get it flushed and during the process the person accessing her port missed 3 times and she noticed some discomfort port site after that. She's had no issues since that time her prior to that. She did lose weight with chemotherapy and she was is being careful with your diet since that time she exercised all summer at the gym and is lost approximately 11 pounds since we last saw her in March. When she gets settled back into [place] she plans on starting keeping up her exercise again. She's not sure when she is due for her mammogram initial take the responsibility of calling indicating that scheduled.  Review of Systems:  Constitutional:Denies fever. Skin: No rash, sores, jaundice, itching, or dryness.  Cardiovascular: No chest pain, shortness of breath, or edema  Pulmonary: No cough or wheeze.  Gastro Intestinal: No nausea, vomiting, constipation, or diarrhea reported. No bright red blood per rectum or change in bowel movement.  Genitourinary: No frequency, urgency, or dysuria.  Denies vaginal bleeding and discharge.  Musculoskeletal: + back and and right knee pain Neurologic: No weakness, numbness, or change in gait.  Psychology: Worried about this visit more than the last one. Very relieved with the CT report that she was given.   Current Meds:  Outpatient Encounter Prescriptions as of 05/23/2013  Medication Sig Dispense Refill  . Calcium Carbonate-Vitamin D (CALCIUM 600 + D PO) Take 2 tablets by mouth 2 (two) times daily.        . cetirizine (ZYRTEC) 10 MG tablet Take 10 mg by mouth daily.      . ferrous gluconate (FERGON) 324 MG tablet Take 1 tablet (324 mg total) by mouth daily with breakfast.  30 tablet  prn  . lisinopril (PRINIVIL,ZESTRIL) 20 MG tablet TAKE 1 TABLET BY MOUTH DAILY  90 tablet  1  . Multiple Vitamins-Minerals  (MULTIVITAMINS THER. W/MINERALS) TABS Take 1 tablet by mouth daily.        . potassium chloride SA (K-DUR,KLOR-CON) 20 MEQ tablet Take one tablet by mouth twice daily as directed.  180 tablet  2  . torsemide (DEMADEX) 20 MG tablet Take 1 tablet (20 mg total) by mouth daily.  90 tablet  3  . vitamin E 400 UNIT capsule Take 800 Units by mouth daily.      . dabigatran (PRADAXA) 150 MG CAPS TAKE ONE CAPSULE BY MOUTH EVERY 12 HOURS  180 capsule  1  . oxyCODONE (OXY IR/ROXICODONE) 5 MG immediate release tablet Take 5 mg by mouth every 4 (four) hours as needed. For pain      . zolpidem (AMBIEN) 10 MG tablet TAKE 1/2 TO 1 TABLET BY MOUTH AS NEEDED FOR SLEEP  30 tablet  0   Facility-Administered Encounter Medications as of 05/23/2013  Medication Dose Route Frequency Provider Last Rate Last Dose  . sodium chloride 0.9 % injection 10 mL  10 mL Intracatheter PRN Lennis Buzzy Han, MD        Allergy:  Allergies  Allergen Reactions  . Tylenol [Acetaminophen] Other (See Comments)    Brand name tylenol causes headache    Social Hx:   History   Social History  . Marital Status: Married  Spouse Name: N/A    Number of Children: N/A  . Years of Education: N/A   Occupational History  . Retired Charity fundraiser    Social History Main Topics  . Smoking status: Current Some Day Smoker -- 0.25 packs/day for 54 years    Types: Cigarettes  . Smokeless tobacco: Not on file  . Alcohol Use: 1.8 oz/week    3 Glasses of wine per week     Comment:  occasionally  . Drug Use: No  . Sexual Activity: Not on file   Other Topics Concern  . Not on file   Social History Narrative   No added salt          Past Surgical Hx:  Past Surgical History  Procedure Laterality Date  . Polypectomy  2008     negative 2011,Dr Jarold Motto  . Hemorrhoid surgery    . Tonsillectomy    . Tubal ligation    . Insert / replace / remove pacemaker  DR [facility] pacemaker  09/2009  . Foot surgery      JOINT REPLACEMENT LT GREAT  TOE   20 YRS AGO   . Cataract extraction      OS  . Lumbar laminectomy/decompression microdiscectomy  09/21/2011    Procedure: LUMBAR LAMINECTOMY/DECOMPRESSION MICRODISCECTOMY;  Surgeon: Tia Alert, MD;  Location: [facility] OR[facility];  Service: Neurosurgery;  Laterality: N/A;  lumbar laminectomy lumbar four-lumbar five  . Hysteroscopy w/d&c  05/29/2012    Procedure: DILATATION AND CURETTAGE /HYSTEROSCOPY;  Surgeon: Bennye Alm, MD;  Location: [facility] ORS;  Service: Gynecology;  Laterality: N/A;  W/ Resectascope  . Dilation and evacuation  05/29/2012    Procedure: DILATATION AND EVACUATION;  Surgeon: Bennye Alm, MD;  Location: [facility] ORS;  Service: Gynecology;  Laterality: N/A;    Past Medical Hx:  Past Medical History  Diagnosis Date  . Arthralgia   . Spinal stenosis     spinal stenosis LS spine  . Hypertension   . Asthma 2008 , 2012    RAD after BRONCHITIS    . Arthritis     hands, feet, hips, knees  . Headache(784.0)     allergy related  . Complication of anesthesia     slow to respond  . Dysrhythmia     atrial fib-Medtronic Pacemaker-on pradaxa-off for surgery since 9.25 2013  . Symptomatic bradycardia 2011    Insertion of Medtronic Pacer  . Cancer     uterine leiomyosarcoma    Oncology Hx:    Leiomyosarcoma of uterus   06/15/2012 Initial Diagnosis Leiomyosarcoma of uterus, IA s/p TRH, BSO.   08/06/2012 - 10/22/2012 Chemotherapy 4 cycles of taxotere and gemictabine    Family Hx:  Family History  Problem Relation Age of Onset  . Stroke Mother     post valve replacement; warfarin missed  . Heart attack Father 56  . Breast cancer Sister   . Deep vein thrombosis Daughter     Factor S non functional    Vitals:  Last menstrual period 09/14/2011.  Physical Exam: Well-nourished well-developed female in no acute distress.   Neck: Supple, no lymphadenopathy, no thyromegaly.   Cardiovascular: Regular rate rhythm.   Abdomen: Well-healed surgical incisions. There is no  evidence of any incisional hernias. Abdomen is soft, nontender, there is no palpable mass or hepatosplenomegaly.   Groins: No lymphadenopathy.   Extremities: Some mild erythema and peeling on the dorsum of the hands. She has 1+ nonpitting edema equal bilaterally.  Pelvic: Normal external female genitalia. Vagina is atrophic. Vaginal cuff is intact there's no visible lesions. Bimanual examination reveals no masses or nodularity. Rectal confirms   Assessment/Plan:  77 year old with stage IA high-grade leiomyosarcoma with greater than 20 mitoses per 10 high power field 2 status post 4 cycles of adjuvant chemotherapy with gemcitabine and Taxotere. Posttreatment CT scan is very reassuring. There was a questionable subtle change in the liver and they recommend repeat scan which was stable at 6 months. We will continue to follow the pulmonary lesions expectantly. She will return to see [place] in 3 months time and we will repeat a CT scan in 6 months when she sees Dr. Darrold Span.      Hedwig Mcfall A., MD 05/23/2013, 1:18 PM

## 2013-05-23 NOTE — Patient Instructions (Signed)
Return to gyn oncology in 6 months and we will get a repeat CT scan in 6 months.

## 2013-05-24 ENCOUNTER — Other Ambulatory Visit: Payer: Self-pay | Admitting: Internal Medicine

## 2013-05-24 DIAGNOSIS — C55 Malignant neoplasm of uterus, part unspecified: Secondary | ICD-10-CM

## 2013-05-27 ENCOUNTER — Telehealth: Payer: Self-pay | Admitting: Oncology

## 2013-05-27 ENCOUNTER — Ambulatory Visit (INDEPENDENT_AMBULATORY_CARE_PROVIDER_SITE_OTHER): Payer: Medicare Other | Admitting: *Deleted

## 2013-05-27 ENCOUNTER — Ambulatory Visit (HOSPITAL_BASED_OUTPATIENT_CLINIC_OR_DEPARTMENT_OTHER): Payer: Self-pay | Admitting: Internal Medicine

## 2013-05-27 VITALS — BP 130/72 | HR 79 | Temp 96.9°F | Resp 20 | Ht 62.5 in | Wt 169.9 lb

## 2013-05-27 DIAGNOSIS — I4892 Unspecified atrial flutter: Secondary | ICD-10-CM

## 2013-05-27 DIAGNOSIS — C55 Malignant neoplasm of uterus, part unspecified: Secondary | ICD-10-CM

## 2013-05-27 LAB — REMOTE PACEMAKER DEVICE
AL IMPEDENCE PM: 417 Ohm
AL THRESHOLD: 0.5 V
ATRIAL PACING PM: 84
BATTERY VOLTAGE: 2.79 V
RV LEAD AMPLITUDE: 22.4 mv
RV LEAD IMPEDENCE PM: 617 Ohm
RV LEAD THRESHOLD: 0.375 V

## 2013-05-27 NOTE — Progress Notes (Signed)
Patient came into office today with understanding that she would be seeing Dr Darrold Span. Patient was still listed under Provider #1, and does not feel she needs to see "another doctor". She said she feels fine and will wait until she can be seen by Dr Darrold Span. Will have appts r/s to Dr Darrold Span and notify billing for credit/refund of copay.

## 2013-05-27 NOTE — Telephone Encounter (Signed)
Informed by desk nurse today that pt came in today and was scheduled w/CP1 but expecting to see LL. Pt has paid her copay already but asked to be r/s'd w/LL. Added appt for 10/8 @ 10:30am w/LL. Called pt and lmonvm for pt re appt d/t for 10/8 @ 10:30am w/LL. Schedule mailed. email to [facility] re copay.

## 2013-05-29 NOTE — Progress Notes (Signed)
Question to billing

## 2013-05-31 ENCOUNTER — Other Ambulatory Visit: Payer: Self-pay | Admitting: Oncology

## 2013-05-31 DIAGNOSIS — C55 Malignant neoplasm of uterus, part unspecified: Secondary | ICD-10-CM

## 2013-06-04 ENCOUNTER — Telehealth: Payer: Self-pay | Admitting: Oncology

## 2013-06-04 NOTE — Telephone Encounter (Signed)
, °

## 2013-06-05 ENCOUNTER — Ambulatory Visit (HOSPITAL_BASED_OUTPATIENT_CLINIC_OR_DEPARTMENT_OTHER): Payer: Medicare Other

## 2013-06-05 ENCOUNTER — Telehealth: Payer: Self-pay

## 2013-06-05 ENCOUNTER — Ambulatory Visit (HOSPITAL_BASED_OUTPATIENT_CLINIC_OR_DEPARTMENT_OTHER): Payer: Medicare Other | Admitting: Oncology

## 2013-06-05 ENCOUNTER — Encounter: Payer: Self-pay | Admitting: Oncology

## 2013-06-05 ENCOUNTER — Telehealth: Payer: Self-pay | Admitting: *Deleted

## 2013-06-05 ENCOUNTER — Other Ambulatory Visit (HOSPITAL_BASED_OUTPATIENT_CLINIC_OR_DEPARTMENT_OTHER): Payer: Medicare Other | Admitting: Lab

## 2013-06-05 VITALS — BP 108/72 | HR 96 | Temp 98.0°F | Resp 18 | Ht 62.0 in | Wt 171.2 lb

## 2013-06-05 DIAGNOSIS — C499 Malignant neoplasm of connective and soft tissue, unspecified: Secondary | ICD-10-CM

## 2013-06-05 DIAGNOSIS — C55 Malignant neoplasm of uterus, part unspecified: Secondary | ICD-10-CM

## 2013-06-05 DIAGNOSIS — Z1231 Encounter for screening mammogram for malignant neoplasm of breast: Secondary | ICD-10-CM

## 2013-06-05 DIAGNOSIS — F17200 Nicotine dependence, unspecified, uncomplicated: Secondary | ICD-10-CM

## 2013-06-05 DIAGNOSIS — I4891 Unspecified atrial fibrillation: Secondary | ICD-10-CM

## 2013-06-05 LAB — CBC WITH DIFFERENTIAL/PLATELET
BASO%: 1 % (ref 0.0–2.0)
EOS%: 2 % (ref 0.0–7.0)
Eosinophils Absolute: 0.1 10*3/uL (ref 0.0–0.5)
HCT: 37.6 % (ref 34.8–46.6)
LYMPH%: 26.5 % (ref 14.0–49.7)
MCH: 29.1 pg (ref 25.1–34.0)
MCV: 87.3 fL (ref 79.5–101.0)
MONO#: 0.6 10*3/uL (ref 0.1–0.9)
MONO%: 8.1 % (ref 0.0–14.0)
NEUT#: 4.6 10*3/uL (ref 1.5–6.5)
NEUT%: 62.4 % (ref 38.4–76.8)
Platelets: 189 10*3/uL (ref 145–400)
RBC: 4.31 10*6/uL (ref 3.70–5.45)
WBC: 7.4 10*3/uL (ref 3.9–10.3)

## 2013-06-05 LAB — COMPREHENSIVE METABOLIC PANEL (CC13)
AST: 19 U/L (ref 5–34)
Albumin: 3.7 g/dL (ref 3.5–5.0)
Alkaline Phosphatase: 95 U/L (ref 40–150)
Anion Gap: 8 mEq/L (ref 3–11)
BUN: 15.6 mg/dL (ref 7.0–26.0)
CO2: 30 mEq/L — ABNORMAL HIGH (ref 22–29)
Creatinine: 0.8 mg/dL (ref 0.6–1.1)
Glucose: 90 mg/dl (ref 70–140)
Sodium: 142 mEq/L (ref 136–145)
Total Bilirubin: 0.55 mg/dL (ref 0.20–1.20)

## 2013-06-05 MED ORDER — SODIUM CHLORIDE 0.9 % IJ SOLN
10.0000 mL | INTRAMUSCULAR | Status: DC | PRN
  Administered 2013-06-05: 10 mL via INTRAVENOUS
  Filled 2013-06-05: qty 10

## 2013-06-05 MED ORDER — HEPARIN SOD (PORK) LOCK FLUSH 100 UNIT/ML IV SOLN
500.0000 [IU] | Freq: Once | INTRAVENOUS | Status: AC
  Administered 2013-06-05: 500 [IU] via INTRAVENOUS
  Filled 2013-06-05: qty 5

## 2013-06-05 NOTE — Telephone Encounter (Signed)
Message copied by Lorine Bears on Wed Jun 05, 2013  5:17 PM ------      Message from: Reece Packer      Created: Wed Jun 05, 2013  1:59 PM       Labs seen and need follow up: please let her know chemistries good      Cc LA, TH ------

## 2013-06-05 NOTE — Patient Instructions (Signed)
Implanted Port Instructions  An implanted port is a central line that has a round shape and is placed under the skin. It is used for long-term IV (intravenous) access for:  · Medicine.  · Fluids.  · Liquid nutrition, such as TPN (total parenteral nutrition).  · Blood samples.  Ports can be placed:  · In the chest area just below the collarbone (this is the most common place.)  · In the arms.  · In the belly (abdomen) area.  · In the legs.  PARTS OF THE PORT  A port has 2 main parts:  · The reservoir. The reservoir is round, disc-shaped, and will be a small, raised area under your skin.  · The reservoir is the part where a needle is inserted (accessed) to either give medicines or to draw blood.  · The catheter. The catheter is a long, slender tube that extends from the reservoir. The catheter is placed into a large vein.  · Medicine that is inserted into the reservoir goes into the catheter and then into the vein.  INSERTION OF THE PORT  · The port is surgically placed in either an operating room or in a procedural area (interventional radiology).  · Medicine may be given to help you relax during the procedure.  · The skin where the port will be inserted is numbed (local anesthetic).  · 1 or 2 small cuts (incisions) will be made in the skin to insert the port.  · The port can be used after it has been inserted.  INCISION SITE CARE  · The incision site may have small adhesive strips on it. This helps keep the incision site closed. Sometimes, no adhesive strips are placed. Instead of adhesive strips, a special kind of surgical glue is used to keep the incision closed.  · If adhesive strips were placed on the incision sites, do not take them off. They will fall off on their own.  · The incision site may be sore for 1 to 2 days. Pain medicine can help.  · Do not get the incision site wet. Bathe or shower as directed by your caregiver.  · The incision site should heal in 5 to 7 days. A small scar may form after the  incision has healed.  ACCESSING THE PORT  Special steps must be taken to access the port:  · Before the port is accessed, a numbing cream can be placed on the skin. This helps numb the skin over the port site.  · A sterile technique is used to access the port.  · The port is accessed with a needle. Only "non-coring" port needles should be used to access the port. Once the port is accessed, a blood return should be checked. This helps ensure the port is in the vein and is not clogged (clotted).  · If your caregiver believes your port should remain accessed, a clear (transparent) bandage will be placed over the needle site. The bandage and needle will need to be changed every week or as directed by your caregiver.  · Keep the bandage covering the needle clean and dry. Do not get it wet. Follow your caregiver's instructions on how to take a shower or bath when the port is accessed.  · If your port does not need to stay accessed, no bandage is needed over the port.  FLUSHING THE PORT  Flushing the port keeps it from getting clogged. How often the port is flushed depends on:  · If a   constant infusion is running. If a constant infusion is running, the port may not need to be flushed.  · If intermittent medicines are given.  · If the port is not being used.  For intermittent medicines:  · The port will need to be flushed:  · After medicines have been given.  · After blood has been drawn.  · As part of routine maintenance.  · A port is normally flushed with:  · Normal saline.  · Heparin.  · Follow your caregiver's advice on how often, how much, and the type of flush to use on your port.  IMPORTANT PORT INFORMATION  · Tell your caregiver if you are allergic to heparin.  · After your port is placed, you will get a manufacturer's information card. The card has information about your port. Keep this card with you at all times.  · There are many types of ports available. Know what kind of port you have.  · In case of an  emergency, it may be helpful to wear a medical alert bracelet. This can help alert health care workers that you have a port.  · The port can stay in for as long as your caregiver believes it is necessary.  · When it is time for the port to come out, surgery will be done to remove it. The surgery will be similar to how the port was put in.  · If you are in the hospital or clinic:  · Your port will be taken care of and flushed by a nurse.  · If you are at home:  · A home health care nurse may give medicines and take care of the port.  · You or a family member can get special training and directions for giving medicine and taking care of the port at home.  SEEK IMMEDIATE MEDICAL CARE IF:   · Your port does not flush or you are unable to get a blood return.  · New drainage or pus is coming from the incision.  · A bad smell is coming from the incision site.  · You develop swelling or increased redness at the incision site.  · You develop increased swelling or pain at the port site.  · You develop swelling or pain in the surrounding skin near the port.  · You have an oral temperature above 102° F (38.9° C), not controlled by medicine.  MAKE SURE YOU:   · Understand these instructions.  · Will watch your condition.  · Will get help right away if you are not doing well or get worse.  Document Released: 08/15/2005 Document Revised: 11/07/2011 Document Reviewed: 11/06/2008  Exit[employer]® Patient Information ©2014 [employer].

## 2013-06-05 NOTE — Telephone Encounter (Signed)
appts made and printed. Pt is aware LL schedule is not out for 3/*2015. Printed order .Marland Kitchen..td

## 2013-06-05 NOTE — Progress Notes (Signed)
OFFICE PROGRESS NOTE   06/05/2013   Physicians:Paola Gehrig, [name], [name] / [name], [name] / [name], [name], [name], [name] (podiatry), [name], [name]   INTERVAL HISTORY:  Patient is seen, together with husband, in follow up of high grade uterine leiomyosarcoma, on observation since she completed adjuvant gemzar taxotere in Feb 2014. She saw [name] after CT CAP done 05-21-13, which had stable tiny pulmonary nodules and nothing that appeared of concern for recurrent or metastatic leiomyosarcoma; she is to see [name] again in Jan 2015. She has PAC in, due flush today. This was last flushed in August when she was in [place], with access difficult then and local discomfort for several days after that procedure.  She is feeling entirely well, with no significant residual problems from the gyn oncology surgery or the chemotherapy. Energy and appetite are great, she has stopped smoking (several times) and is now trying nicotine patch. Bowels are moving regularly, no new or different pain, no bleeding, no LE swelling, no respiratory symptoms.   ONCOLOGIC HISTORY Patient had vaginal bleeding Sept/ early Oct 2013. She was seen by gyn within 2-3 days, with ultrasound done, then [facility] with hysteroscopic myomectomy and polypectomy by [name] on 05-29-12, path [id number] with high grade leiomyosarcoma with >10 mitoses/ 10 HPF, 3.5 cm into outer half of myometrium, no LVSI. This pathology was confirmed at [facility] She was referred to [name], had CT CAP done in [facility] system 06-12-12 with 2 indeterminate left lung nodules largest 6 mm and 3 cm posterior uterine myometrial mass without apparent extrauterine extension or metastatic disease. She went to robotic assisted laparoscopic hysterectomy, bilateral salpingo-oophorectomy 06/15/12 at [facility] by [name] with findings of physiologic adhesions of right adnexum to appendix; small fibroid noted on uterus,  otherwise normal abdominal survey; no evidence of extrauterine disease on evaluation. Pathology from the [facility] surgery [phone]) had high grade leiomyosarcoma with > 20 mitoses/10 HPF and invasion into outer half of myometrium, negative LVSI and benign ovaries/tubes. Gyn oncology recommended gemzar/ taxotere off study due to her high risk pathology.  As above, 4 cycles of gemzar/taxotere were given from 08-06-12 thru 10-22-12, with dose reduction of gemzar due to skin toxicity and also with cytopenias which required PRBC transfusion and holding pradaxa in addition to neulasta.   Their daughter, who is a Tax adviser, is taking some time off from work.  Review of systems as above, also: Unchanged knee problems. Sleeping fairly well. No discomfort now with PAC Remainder of 10 point Review of Systems negative.  Objective:  Vital signs in last 24 hours:  BP 108/72  Pulse 96  Temp(Src) 98 F (36.7 C) (Oral)  Resp 18  Ht 5\' 2"  (1.575 m)  Wt 171 lb 3.2 oz (77.656 kg)  BMI 31.31 kg/m2  LMP 09/14/2011  Alert, oriented and appropriate. Ambulatory without assistance. Looks energetic and comfortable   HEENT:PERRL, sclerae not icteric. Oral mucosa moist without lesions, posterior pharynx clear.  Neck supple. No JVD.  Lymphatics:no cervical,suraclavicular, axillary or inguinal adenopathy Resp: diminished breath sounds thruout otherwise clear to auscultation bilaterally and normal percussion bilaterally Cardio: regular rate and rhythm. No gallop. GI: soft, nontender, not distended, no mass or organomegaly. Normally active bowel sounds. Surgical incision not remarkable. Musculoskeletal/ Extremities: without pitting edema, cords, tenderness. Back not tender Neuro: no peripheral neuropathy. Otherwise nonfocal Skin without rash, ecchymosis, petechiae  Axillae benign. Portacath-without erythema or tenderness  Lab Results:  Results for orders placed in  visit on 06/05/13  CBC  WITH DIFFERENTIAL      Result Value Range   WBC 7.4  3.9 - 10.3 10e3/uL   NEUT# 4.6  1.5 - 6.5 10e3/uL   HGB 12.5  11.6 - 15.9 g/dL   HCT 16.1  09.6 - 04.5 %   Platelets 189  145 - 400 10e3/uL   MCV 87.3  79.5 - 101.0 fL   MCH 29.1  25.1 - 34.0 pg   MCHC 33.3  31.5 - 36.0 g/dL   RBC 4.09  8.11 - 9.14 10e6/uL   RDW 13.6  11.2 - 14.5 %   lymph# 2.0  0.9 - 3.3 10e3/uL   MONO# 0.6  0.1 - 0.9 10e3/uL   Eosinophils Absolute 0.1  0.0 - 0.5 10e3/uL   Basophils Absolute 0.1  0.0 - 0.1 10e3/uL   NEUT% 62.4  38.4 - 76.8 %   LYMPH% 26.5  14.0 - 49.7 %   MONO% 8.1  0.0 - 14.0 %   EOS% 2.0  0.0 - 7.0 %   BASO% 1.0  0.0 - 2.0 %  COMPREHENSIVE METABOLIC PANEL (CC13)      Result Value Range   Sodium 142  136 - 145 mEq/L   Potassium 3.9  3.5 - 5.1 mEq/L   Chloride 105  98 - 109 mEq/L   CO2 30 (*) 22 - 29 mEq/L   Glucose 90  70 - 140 mg/dl   BUN 78.2  7.0 - 95.6 mg/dL   Creatinine 0.8  0.6 - 1.1 mg/dL   Total Bilirubin 2.13  0.20 - 1.20 mg/dL   Alkaline Phosphatase 95  40 - 150 U/L   AST 19  5 - 34 U/L   ALT 6  0 - 55 U/L   Total Protein 6.7  6.4 - 8.3 g/dL   Albumin 3.7  3.5 - 5.0 g/dL   Calcium 08.6  8.4 - 57.8 mg/dL   Anion Gap 8  3 - 11 mEq/L     Studies/Results: CT CHEST, ABDOMEN, AND PELVIS WITH CONTRAST 05-21-13 COMPARISON: 11/06/2012  FINDINGS:  CT CHEST FINDINGS  No pleural effusion identified. Tiny left upper lobe nodule is  stable measuring 2 mm, image number 8/series 5. The left lung base  nodule is unchanged measuring 5 mm, image 41/ series 5. No new or  enlarging pulmonary nodules or masses identified. No airspace  consolidation noted. The heart size is normal. No pericardial  effusion identified. Calcifications are noted within the LAD and  left circumflex coronary artery. No enlarged mediastinal or hilar  lymph nodes identified.  Review of the visualized osseous structures is significant for mild  multilevel thoracic spondylosis. No aggressive lytic or sclerotic   bone lesions.  CT ABDOMEN AND PELVIS FINDINGS  Subtle, 4 mm low attenuation structure within the right hepatic lobe  is unchanged from previous exam. The gallbladder appears normal. No  biliary dilatation. Normal appearance of the pancreas. The spleen is  negative.  Normal appearance of both adrenal glands. The left kidney stress set  there are bilateral renal cysts identified. Urinary bladder appears  normal. Previous hysterectomy.  Calcified atherosclerotic disease affects the abdominal aorta. No  aneurysm. No upper abdominal adenopathy identified. No pelvic or  inguinal adenopathy noted.  No free fluid or abnormal fluid collections identified within the  abdomen or pelvis. The stomach appears normal. The small bowel loops  are within normal limits. Normal appearance of the colon. Review of  the visualized osseous structures is on  unremarkable. No aggressive  lytic or sclerotic bone lesions identified.  IMPRESSION:  CT CHEST IMPRESSION  1. Stable small pulmonary nodules. No new findings identified.  CT ABDOMEN AND PELVIS IMPRESSION  1. The previously referenced, subtle low attenuation structure in  the right hepatic lobe is unchanged from previous exam.  2. No specific evidence of recurrent pelvic tumor or metastatic  disease.   She is overdue mammograms, last in EMR at [facility] 07-15-2011. We will schedule these if patient agrees.   Medications: I have reviewed the patient's current medications. She plans to have flu vaccine elsewhere this fall    Assessment/Plan: 1.IA high grade leiomyosarcoma of uterus: Completed planned 4 cycles of gemzar/taxotere on 10-22-12. No known disease now, tho pulmonary nodules and vague area in liver will be followed up with next scans.  2.anemia and other blood counts recovered 3.PAC in: will flush every 6-8 weeks while not otherwise used. I will try to coordinate flushes with visits as possible. 4.atrial fibrillation and pacer in, back on  pradaxa  5.actively trying to stop smoking, using nicotine patch. Husband has also stopped smoking now.  6. For flu vaccine elsewhere 7.HTN, asthma, borderline hypothyroid, chronic bursitis hips, spinal stenosis 8. Overdue mammograms which we have discussed  She is to see [name] in Jan and I will see her in ~ late March. Plan is for repeat CT in ~ 6 months. Patient and husband understand and agree with plan above.   Reece Packer, MD   06/05/2013, 12:31 PM

## 2013-06-05 NOTE — Patient Instructions (Signed)
portacath should be flushed every 6-8 weeks when not otherwise used

## 2013-06-05 NOTE — Telephone Encounter (Signed)
Told Mr. Wawrzyniak that Ms. Winburn's chemistries from today were fine per Dr. Darrold Span.  He will relay the message as his wife is sleeping.

## 2013-06-19 ENCOUNTER — Encounter: Payer: Self-pay | Admitting: *Deleted

## 2013-07-05 ENCOUNTER — Encounter: Payer: Self-pay | Admitting: Internal Medicine

## 2013-07-09 ENCOUNTER — Ambulatory Visit
Admission: RE | Admit: 2013-07-09 | Discharge: 2013-07-09 | Disposition: A | Discharge status: Home / Self Care | Payer: Self-pay | Source: Ambulatory Visit | Attending: Oncology | Admitting: Oncology

## 2013-07-09 DIAGNOSIS — Z1231 Encounter for screening mammogram for malignant neoplasm of breast: Secondary | ICD-10-CM

## 2013-07-30 ENCOUNTER — Telehealth: Payer: Self-pay | Admitting: *Deleted

## 2013-07-30 NOTE — Telephone Encounter (Signed)
Pt called to cancel her appt for 07/31/13 due to a funeral and rs for 08/06/13. gv appt w/ labs@ 10am and flush @ 11am. Pt is aware...td

## 2013-07-31 ENCOUNTER — Other Ambulatory Visit: Payer: Medicare Other | Admitting: Lab

## 2013-08-06 ENCOUNTER — Ambulatory Visit: Payer: Self-pay | Attending: Gynecologic Oncology | Admitting: Gynecologic Oncology

## 2013-08-06 ENCOUNTER — Telehealth: Payer: Self-pay | Admitting: *Deleted

## 2013-08-06 ENCOUNTER — Encounter: Payer: Self-pay | Admitting: Gynecologic Oncology

## 2013-08-06 ENCOUNTER — Other Ambulatory Visit (HOSPITAL_BASED_OUTPATIENT_CLINIC_OR_DEPARTMENT_OTHER): Payer: Medicare Other | Admitting: Lab

## 2013-08-06 ENCOUNTER — Other Ambulatory Visit: Payer: Self-pay | Admitting: Internal Medicine

## 2013-08-06 ENCOUNTER — Ambulatory Visit (HOSPITAL_BASED_OUTPATIENT_CLINIC_OR_DEPARTMENT_OTHER): Payer: Medicare Other

## 2013-08-06 ENCOUNTER — Encounter (INDEPENDENT_AMBULATORY_CARE_PROVIDER_SITE_OTHER): Payer: Self-pay

## 2013-08-06 VITALS — BP 141/64 | HR 86 | Temp 98.3°F | Resp 16 | Ht 61.0 in | Wt 171.9 lb

## 2013-08-06 DIAGNOSIS — C55 Malignant neoplasm of uterus, part unspecified: Secondary | ICD-10-CM

## 2013-08-06 DIAGNOSIS — D259 Leiomyoma of uterus, unspecified: Secondary | ICD-10-CM

## 2013-08-06 LAB — COMPREHENSIVE METABOLIC PANEL (CC13)
ALT: 9 U/L (ref 0–55)
AST: 21 U/L (ref 5–34)
Albumin: 3.9 g/dL (ref 3.5–5.0)
Anion Gap: 13 mEq/L — ABNORMAL HIGH (ref 3–11)
BUN: 20.9 mg/dL (ref 7.0–26.0)
Calcium: 9.9 mg/dL (ref 8.4–10.4)
Chloride: 104 mEq/L (ref 98–109)
Creatinine: 0.9 mg/dL (ref 0.6–1.1)
Sodium: 141 mEq/L (ref 136–145)
Total Bilirubin: 1.03 mg/dL (ref 0.20–1.20)

## 2013-08-06 LAB — CBC WITH DIFFERENTIAL/PLATELET
Basophils Absolute: 0.1 10*3/uL (ref 0.0–0.1)
Eosinophils Absolute: 0.1 10*3/uL (ref 0.0–0.5)
HGB: 12.3 g/dL (ref 11.6–15.9)
LYMPH%: 22.5 % (ref 14.0–49.7)
MONO#: 0.9 10*3/uL (ref 0.1–0.9)
NEUT#: 7.3 10*3/uL — ABNORMAL HIGH (ref 1.5–6.5)
NEUT%: 67.1 % (ref 38.4–76.8)
Platelets: 208 10*3/uL (ref 145–400)
RBC: 4.15 10*6/uL (ref 3.70–5.45)
RDW: 14.5 % (ref 11.2–14.5)
WBC: 10.8 10*3/uL — ABNORMAL HIGH (ref 3.9–10.3)
lymph#: 2.4 10*3/uL (ref 0.9–3.3)

## 2013-08-06 MED ORDER — SODIUM CHLORIDE 0.9 % IJ SOLN
10.0000 mL | INTRAMUSCULAR | Status: DC | PRN
  Administered 2013-08-06: 10 mL via INTRAVENOUS
  Filled 2013-08-06: qty 10

## 2013-08-06 MED ORDER — HEPARIN SOD (PORK) LOCK FLUSH 100 UNIT/ML IV SOLN
500.0000 [IU] | Freq: Once | INTRAVENOUS | Status: AC
  Administered 2013-08-06: 500 [IU] via INTRAVENOUS
  Filled 2013-08-06: qty 5

## 2013-08-06 NOTE — Telephone Encounter (Signed)
appts made and printed...td 

## 2013-08-06 NOTE — Patient Instructions (Signed)
Implanted Port Instructions  An implanted port is a central line that has a round shape and is placed under the skin. It is used for long-term IV (intravenous) access for:  · Medicine.  · Fluids.  · Liquid nutrition, such as TPN (total parenteral nutrition).  · Blood samples.  Ports can be placed:  · In the chest area just below the collarbone (this is the most common place.)  · In the arms.  · In the belly (abdomen) area.  · In the legs.  PARTS OF THE PORT  A port has 2 main parts:  · The reservoir. The reservoir is round, disc-shaped, and will be a small, raised area under your skin.  · The reservoir is the part where a needle is inserted (accessed) to either give medicines or to draw blood.  · The catheter. The catheter is a long, slender tube that extends from the reservoir. The catheter is placed into a large vein.  · Medicine that is inserted into the reservoir goes into the catheter and then into the vein.  INSERTION OF THE PORT  · The port is surgically placed in either an operating room or in a procedural area (interventional radiology).  · Medicine may be given to help you relax during the procedure.  · The skin where the port will be inserted is numbed (local anesthetic).  · 1 or 2 small cuts (incisions) will be made in the skin to insert the port.  · The port can be used after it has been inserted.  INCISION SITE CARE  · The incision site may have small adhesive strips on it. This helps keep the incision site closed. Sometimes, no adhesive strips are placed. Instead of adhesive strips, a special kind of surgical glue is used to keep the incision closed.  · If adhesive strips were placed on the incision sites, do not take them off. They will fall off on their own.  · The incision site may be sore for 1 to 2 days. Pain medicine can help.  · Do not get the incision site wet. Bathe or shower as directed by your caregiver.  · The incision site should heal in 5 to 7 days. A small scar may form after the  incision has healed.  ACCESSING THE PORT  Special steps must be taken to access the port:  · Before the port is accessed, a numbing cream can be placed on the skin. This helps numb the skin over the port site.  · A sterile technique is used to access the port.  · The port is accessed with a needle. Only "non-coring" port needles should be used to access the port. Once the port is accessed, a blood return should be checked. This helps ensure the port is in the vein and is not clogged (clotted).  · If your caregiver believes your port should remain accessed, a clear (transparent) bandage will be placed over the needle site. The bandage and needle will need to be changed every week or as directed by your caregiver.  · Keep the bandage covering the needle clean and dry. Do not get it wet. Follow your caregiver's instructions on how to take a shower or bath when the port is accessed.  · If your port does not need to stay accessed, no bandage is needed over the port.  FLUSHING THE PORT  Flushing the port keeps it from getting clogged. How often the port is flushed depends on:  · If a   constant infusion is running. If a constant infusion is running, the port may not need to be flushed.  · If intermittent medicines are given.  · If the port is not being used.  For intermittent medicines:  · The port will need to be flushed:  · After medicines have been given.  · After blood has been drawn.  · As part of routine maintenance.  · A port is normally flushed with:  · Normal saline.  · Heparin.  · Follow your caregiver's advice on how often, how much, and the type of flush to use on your port.  IMPORTANT PORT INFORMATION  · Tell your caregiver if you are allergic to heparin.  · After your port is placed, you will get a manufacturer's information card. The card has information about your port. Keep this card with you at all times.  · There are many types of ports available. Know what kind of port you have.  · In case of an  emergency, it may be helpful to wear a medical alert bracelet. This can help alert health care workers that you have a port.  · The port can stay in for as long as your caregiver believes it is necessary.  · When it is time for the port to come out, surgery will be done to remove it. The surgery will be similar to how the port was put in.  · If you are in the hospital or clinic:  · Your port will be taken care of and flushed by a nurse.  · If you are at home:  · A home health care nurse may give medicines and take care of the port.  · You or a family member can get special training and directions for giving medicine and taking care of the port at home.  SEEK IMMEDIATE MEDICAL CARE IF:   · Your port does not flush or you are unable to get a blood return.  · New drainage or pus is coming from the incision.  · A bad smell is coming from the incision site.  · You develop swelling or increased redness at the incision site.  · You develop increased swelling or pain at the port site.  · You develop swelling or pain in the surrounding skin near the port.  · You have an oral temperature above 102° F (38.9° C), not controlled by medicine.  MAKE SURE YOU:   · Understand these instructions.  · Will watch your condition.  · Will get help right away if you are not doing well or get worse.  Document Released: 08/15/2005 Document Revised: 11/07/2011 Document Reviewed: 11/06/2008  Exit[employer]® Patient Information ©2014 [employer].

## 2013-08-06 NOTE — Patient Instructions (Signed)
Follow up with Dr. Darrold Span in March 2015. Dr. Duard Brady [facility] approximately 3 months after that visit. Dr. Darrold Span is planning a CT scan in approximately 4 months.

## 2013-08-06 NOTE — Progress Notes (Signed)
Consult Note: Gyn-Onc  Amy Dominguez 77 y.o. female  CC:  Chief Complaint  Patient presents with  . Uterine sarcoma    Follow up    HPI: Patient had vaginal bleeding Sept/ early Oct 2013. She was seen by gyn within 2-3 days, with ultrasound done, then 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 This pathology was confirmed at [facility]. She had CT CAP done in  system 06-12-12 with 2 indeterminate left lung nodules largest 6 mm and 3 cm posterior uterine myometrial mass without apparent extrauterine extension or metastatic disease. She went to robotic assisted laparoscopic hysterectomy, bilateral salpingo-oophorectomy 06/15/12 at [facility] with findings of physiologic adhesions of right adnexum to appendix; small fibroid noted on uterus, otherwise normal abdominal survey; no evidence of extrauterine disease on evaluation. Pathology from the [facility] surgery [phone]) had high grade leiomyosarcoma with > 20 mitoses/10 HPF and invasion into outer half of myometrium, negative LVSI and benign ovaries/tubes. Gyn oncology recommended gemzar/ taxotere off study due to her high risk pathology. 4 cycles of gemzar/taxotere were given from 08-06-12 thru 10-22-12, with dose reduction of gemzar due to skin toxicity and also with cytopenias which required PRBC transfusion and holding pradaxa in addition to neulasta.    Interval History:  I last saw her in March of this year she was seen by Dr. Darrold Span in April. At that time CT scan from March showed no pelvic tumor, there is a similar lesion that was not previously seen and a stable small left lung nodule. As recommended she'll follow up in 6 months. She had CT scan May 21, 2013 that revealed:  COMPARISON: 11/06/2012  FINDINGS:  CT CHEST FINDINGS  No pleural effusion identified. Tiny left upper lobe nodule is  stable measuring 2 mm, image number 8/series 5. The left lung base nodule is unchanged measuring 5 mm, image 41/ series 5. No new or enlarging pulmonary nodules or masses identified. No airspace consolidation noted. The heart size is normal. No pericardial effusion identified. Calcifications are noted within the LAD and left circumflex coronary artery. No enlarged mediastinal or hilar lymph nodes identified.  Review of the visualized osseous structures is significant for mild multilevel thoracic spondylosis. No aggressive lytic or sclerotic bone lesions.   CT ABDOMEN AND PELVIS FINDINGS  Subtle, 4 mm low attenuation structure within the right hepatic lobe is unchanged from previous exam. The gallbladder appears normal. No biliary dilatation. Normal appearance of the pancreas. The spleen is negative. Normal appearance of both adrenal glands. The left kidney stress set there are bilateral renal cysts identified. Urinary bladder appears normal. Previous hysterectomy. Calcified atherosclerotic disease affects the abdominal aorta. No aneurysm. No upper abdominal adenopathy identified. No pelvic or inguinal adenopathy noted. No free fluid or abnormal fluid collections identified within the abdomen or pelvis. The stomach appears normal. The small bowel loops are within normal limits. Normal appearance of the colon. Review of the visualized osseous structures is on unremarkable. No aggressive lytic or sclerotic bone lesions identified.  IMPRESSION:  CT CHEST IMPRESSION  1. Stable small pulmonary nodules. No new findings identified.  CT ABDOMEN AND PELVIS IMPRESSION  1. The previously referenced, subtle low attenuation structure in the right hepatic lobe is unchanged from previous exam.  2. No specific evidence of recurrent pelvic tumor or metastatic disease.  Interval History: She is overall doing quite well. She states that  her energy level is "age appropriate". She continues to have her back pain and she  states that when she gets tired this were she really feels it. She did have her port flushed today and labs were drawn. Her sister-in-law passed away last week. Pelvic she has been in declining health and her since his stroke many years ago. She was recently diagnosed with an abdominal mass and survived approximately 3 weeks. She did have a mammogram performed in the latter part of 2014 and she has one year followup scheduled. As stated above, she had a negative CT scan in September 2014. Dr. Darrold Span has a CT scan planned for the spring.  Review of Systems:  Constitutional:Denies fever. Skin: No rash, sores, jaundice, itching, or dryness.  Cardiovascular: No chest pain, shortness of breath, or edema  Pulmonary: No cough or wheeze.  Gastro Intestinal: No nausea, vomiting, constipation, or diarrhea reported. No bright red blood per rectum or change in bowel movement.  Genitourinary: No frequency, urgency, or dysuria.  Denies vaginal bleeding and discharge.  Musculoskeletal: + back and and right knee pain Neurologic: No weakness,+ numbness tips of her toes, or change in gait.  Psychology: No changes   Current Meds:  Outpatient Encounter Prescriptions as of 08/06/2013  Medication Sig  . Calcium Carbonate-Vitamin D (CALCIUM 600 + D PO) Take 2 tablets by mouth 2 (two) times daily.    . dabigatran (PRADAXA) 150 MG CAPS TAKE ONE CAPSULE BY MOUTH EVERY 12 HOURS  . lisinopril (PRINIVIL,ZESTRIL) 20 MG tablet TAKE 1 TABLET BY MOUTH DAILY  . Loratadine (CLARITIN PO) Take 1 tablet by mouth at bedtime.  . Multiple Vitamins-Minerals (MULTIVITAMINS THER. W/MINERALS) TABS Take 1 tablet by mouth daily.    Marland Kitchen oxyCODONE (OXY IR/ROXICODONE) 5 MG immediate release tablet Take 5 mg by mouth every 4 (four) hours as needed. For pain  . potassium chloride SA (K-DUR,KLOR-CON) 20 MEQ tablet Take one tablet by mouth twice daily as directed.  . torsemide (DEMADEX) 20 MG tablet Take 1 tablet (20 mg total) by mouth daily.  .  vitamin E 400 UNIT capsule Take 800 Units by mouth daily.  . cetirizine (ZYRTEC) 10 MG tablet Take 10 mg by mouth daily.  . ferrous gluconate (FERGON) 324 MG tablet Take 1 tablet (324 mg total) by mouth daily with breakfast.    Allergy:  Allergies  Allergen Reactions  . Tylenol [Acetaminophen] Other (See Comments)    Brand name tylenol causes headache    Social Hx:   History   Social History  . Marital Status: Married    Spouse Name: N/A    Number of Children: N/A  . Years of Education: N/A   Occupational History  . Retired Charity fundraiser    Social History Main Topics  . Smoking status: Former Smoker -- 0.25 packs/day for 54 years    Types: Cigarettes    Quit date: 06/05/2013  . Smokeless tobacco: Not on file  . Alcohol Use: 1.8 oz/week    3 Glasses of wine per week     Comment:  occasionally  . Drug Use: No  . Sexual Activity: Not on file   Other Topics Concern  . Not on file   Social History Narrative   No added salt          Past Surgical Hx:  Past Surgical History  Procedure Laterality Date  . Polypectomy  2008     negative 2011,Dr Jarold Motto  . Hemorrhoid surgery    . Tonsillectomy    .  Tubal ligation    . Insert / replace / remove pacemaker  DR [facility] pacemaker  09/2009  . Foot surgery      JOINT REPLACEMENT LT GREAT TOE   20 YRS AGO   . Cataract extraction      OS  . Lumbar laminectomy/decompression microdiscectomy  09/21/2011    Procedure: LUMBAR LAMINECTOMY/DECOMPRESSION MICRODISCECTOMY;  Surgeon: Tia Alert, MD;  Location: [facility];  Service: Neurosurgery;  Laterality: N/A;  lumbar laminectomy lumbar four-lumbar five  . Hysteroscopy w/d&c  05/29/2012    Procedure: DILATATION AND CURETTAGE /HYSTEROSCOPY;  Surgeon: Bennye Alm, MD;  Location: [facility];  Service: Gynecology;  Laterality: N/A;  W/ Resectascope  . Dilation and evacuation  05/29/2012    Procedure: DILATATION AND EVACUATION;  Surgeon: Bennye Alm, MD;  Location: [facility];   Service: Gynecology;  Laterality: N/A;    Past Medical Hx:  Past Medical History  Diagnosis Date  . Arthralgia   . Spinal stenosis     spinal stenosis LS spine  . Hypertension   . Asthma 2008 , 2012    RAD after BRONCHITIS    . Arthritis     hands, feet, hips, knees  . Headache(784.0)     allergy related  . Complication of anesthesia     slow to respond  . Dysrhythmia     atrial fib-Medtronic Pacemaker-on pradaxa-off for surgery since 9.25 2013  . Symptomatic bradycardia 2011    Insertion of Medtronic Pacer  . Cancer     uterine leiomyosarcoma    Oncology Hx:    Leiomyosarcoma of uterus   06/15/2012 Initial Diagnosis Leiomyosarcoma of uterus, IA s/p TRH, BSO.   08/06/2012 - 10/22/2012 Chemotherapy 4 cycles of taxotere and gemictabine    Family Hx:  Family History  Problem Relation Age of Onset  . Stroke Mother     post valve replacement; warfarin missed  . Heart attack Father 15  . Breast cancer Sister   . Deep vein thrombosis Daughter     Factor S non functional    Vitals:  Blood pressure 141/64, pulse 86, temperature 98.3 F (36.8 C), temperature source Oral, resp. rate 16, height 5\' 1"  (1.549 m), weight 171 lb 14.4 oz (77.973 kg), last menstrual period 09/14/2011.  Physical Exam: Well-nourished well-developed female in no acute distress.   Neck: Supple, no lymphadenopathy, no thyromegaly.   Cardiovascular: Regular rate rhythm.   Abdomen: Well-healed surgical incision. There is no evidence of any incisional hernias. Abdomen is soft, nontender, there is no palpable mass or hepatosplenomegaly.   Groins: No lymphadenopathy.   Extremities: Some mild erythema and peeling on the dorsum of the hands. She has 1+ nonpitting edema equal bilaterally.   Pelvic: Normal external female genitalia. Vagina is atrophic. Vaginal cuff is intact there's no visible lesions. Bimanual examination reveals no masses or nodularity. Rectal confirms   Assessment/Plan:  77 year old  with stage IA high-grade leiomyosarcoma with greater than 20 mitoses per 10 high power field 2 status post 4 cycles of adjuvant chemotherapy with gemcitabine and Taxotere. Posttreatment CT scan is very reassuring. There was a questionable subtle change in the liver and they recommend repeat scan which was stable at 6 months. We will continue to follow the pulmonary lesions expectantly. She will return to see [place] in 6 months time and she will repeat a CT scan in 3 months when she sees Dr. Darrold Span.      Mariea Mcmartin A., MD 08/06/2013, 11:10 AM

## 2013-08-12 ENCOUNTER — Other Ambulatory Visit: Payer: Self-pay

## 2013-08-12 DIAGNOSIS — C55 Malignant neoplasm of uterus, part unspecified: Secondary | ICD-10-CM

## 2013-08-25 ENCOUNTER — Encounter: Payer: Self-pay | Admitting: Internal Medicine

## 2013-08-25 LAB — MDC_IDC_ENUM_SESS_TYPE_REMOTE
Brady Statistic AP VS Percent: 84 %
Brady Statistic AS VS Percent: 16 %
Date Time Interrogation Session: 20141228201826
Lead Channel Impedance Value: 401 Ohm
Lead Channel Impedance Value: 614 Ohm
Lead Channel Pacing Threshold Amplitude: 0.5 V
Lead Channel Pacing Threshold Pulse Width: 0.4 ms
Lead Channel Sensing Intrinsic Amplitude: 22.4 mV
Lead Channel Setting Pacing Amplitude: 2.5 V
Lead Channel Setting Sensing Sensitivity: 5.6 mV

## 2013-08-26 ENCOUNTER — Ambulatory Visit (INDEPENDENT_AMBULATORY_CARE_PROVIDER_SITE_OTHER): Payer: Medicare Other | Admitting: *Deleted

## 2013-08-26 DIAGNOSIS — I4892 Unspecified atrial flutter: Secondary | ICD-10-CM

## 2013-09-02 ENCOUNTER — Telehealth: Payer: Self-pay | Admitting: *Deleted

## 2013-09-02 NOTE — Telephone Encounter (Signed)
i will mail a letter/avs  Making the pt aware that her appts for 11/20/13 has been move to 11/22/13. 11/22/13 w/ labs@9 :45am, flush@ 10:00, ov@ 10:30am...td

## 2013-09-07 ENCOUNTER — Other Ambulatory Visit: Payer: Self-pay | Admitting: Internal Medicine

## 2013-09-11 ENCOUNTER — Encounter: Payer: Self-pay | Admitting: *Deleted

## 2013-09-13 ENCOUNTER — Telehealth: Payer: Self-pay

## 2013-09-13 NOTE — Telephone Encounter (Signed)
Reviewed appointments scheduled with Dr. Marko Plume on 11-06-13 and followup with Dr. Alycia Rossetti on 01-09-14.   Mailed updated appointment  calendars for march and may to Ms. Idler as she requested.

## 2013-09-17 ENCOUNTER — Telehealth: Payer: Self-pay

## 2013-09-17 NOTE — Telephone Encounter (Signed)
Error

## 2013-09-17 NOTE — Telephone Encounter (Signed)
Medication and allergies:  Reviewed and updated  90 day supply/mail order: na Local pharmacy: [facility] [address]   Immunizations due:  UTD  A/P:   No changes to FH, PSH or Personal Hx Flu vaccine--06/2013 Tdap--08/2010 PNA--would like Prevnar 13 Shingles--2012 Bone Density--2008 CCS--11/2009--Dr Patterson--recommendations q 3 years  To Discuss with Provider: Shingles vaccine for spouse

## 2013-09-19 ENCOUNTER — Encounter: Payer: Self-pay | Admitting: Internal Medicine

## 2013-09-19 ENCOUNTER — Ambulatory Visit (INDEPENDENT_AMBULATORY_CARE_PROVIDER_SITE_OTHER): Payer: Medicare Other | Admitting: Internal Medicine

## 2013-09-19 VITALS — BP 106/69 | HR 85 | Temp 98.3°F | Ht 61.75 in | Wt 175.6 lb

## 2013-09-19 DIAGNOSIS — M791 Myalgia, unspecified site: Secondary | ICD-10-CM

## 2013-09-19 DIAGNOSIS — E785 Hyperlipidemia, unspecified: Secondary | ICD-10-CM

## 2013-09-19 DIAGNOSIS — Z0000 Encounter for general adult medical examination without abnormal findings: Secondary | ICD-10-CM

## 2013-09-19 DIAGNOSIS — E039 Hypothyroidism, unspecified: Secondary | ICD-10-CM

## 2013-09-19 DIAGNOSIS — IMO0001 Reserved for inherently not codable concepts without codable children: Secondary | ICD-10-CM

## 2013-09-19 DIAGNOSIS — Z23 Encounter for immunization: Secondary | ICD-10-CM

## 2013-09-19 DIAGNOSIS — Z85828 Personal history of other malignant neoplasm of skin: Secondary | ICD-10-CM

## 2013-09-19 NOTE — Progress Notes (Signed)
Pre visit review using our clinic review tool, if applicable. No additional management support is needed unless otherwise documented below in the visit note. 

## 2013-09-19 NOTE — Progress Notes (Signed)
Subjective:    Patient ID: Amy Dominguez, female    DOB: Feb 12, 1936, 78 y.o.   MRN: [id number]  HPI Medicare Wellness Visit: Psychosocial and medical history were reviewed as required by Medicare (history related to abuse, antisocial behavior , firearm risk). Social history: Caffeine:3-4 cups /day coffee  , Alcohol:3/week  , Tobacco KWI:OXBD 2014 Exercise:not since holidays Personal safety/fall risk:no Limitations of activities of daily living:no Seatbelt/ smoke alarm use:yes Healthcare Power of Attorney/Living Will status: in place Ophthalmologic exam status:UTD Hearing evaluation status:see below Orientation: Oriented X3 Memory and recall: good Math testing: good Depression/anxiety assessment: no Foreign travel history:2012 [place] Immunization status for influenza/pneumonia/ shingles /tetanus:PNA needed Transfusion history:2014 with chemo induced anemia Preventive health care maintenance status: Colonoscopy/BMD/mammogram/Pap as per protocol/standard care:UTD Dental care:every 3 mos Chart reviewed and updated. Active issues reviewed and addressed as documented below.    Review of Systems Blood pressure range <120/60-70s. Compliant with anti hypertemsive medication. No lightheadedness ,cough ,angioedema or other adverse medication effect described.  Significant headaches, epistaxis, chest pain, palpitations, exertional dyspnea, claudication, paroxysmal nocturnal dyspnea, or edema absent. Mid - upper back pain ,worse standing     Objective:   Physical Exam  Gen.: Healthy and well-nourished in appearance. Alert, appropriate and cooperative throughout exam.Appears younger than stated age  Head: Normocephalic without obvious abnormalities;wearing wig Eyes: No corneal or conjunctival inflammation noted. Pupils equal round reactive to light and accommodation. Extraocular motion intact. Ears: External  ear exam reveals no significant lesions or deformities. Canals clear .TMs  normal. Hearing is grossly normal bilaterally. Nose: External nasal exam reveals no deformity or inflammation. Nasal mucosa are pink and moist. No lesions or exudates noted. Mouth: Oral mucosa and oropharynx reveal no lesions or exudates. Teeth in good repair. Neck: No deformities, masses, or tenderness noted. Range of motion & Thyroid normal. Lungs: Normal respiratory effort; chest expands symmetrically. Lungs are clear to auscultation without rales, wheezes, or increased work of breathing. Heart: Normal rate and rhythm. Normal S1 and S2. No gallop, click, or rub. Grade 1.5-2/6 AS  Murmur with neck radiation Abdomen: Bowel sounds normal; abdomen soft and nontender. No masses, organomegaly or hernias noted. Genitalia: as per Gyn                                  Musculoskeletal/extremities: Accentuated curvature of upper thoracic spine. No clubbing, cyanosis, or edema. Range of motion decreased @ knee .Tone & strength normal. Hand joints reveal   DJD DIP changes. Fingernail / toenail health good. Able to lie down & sit up w/o help. Negative SLR bilaterally Vascular: Carotid, radial artery, dorsalis pedis and  posterior tibial pulses are equal.Decreased PTP. No bruits present. Neurologic: Alert and oriented x3. Deep tendon reflexes symmetrical and normal.        Skin: Intact without suspicious lesions or rashes. Lymph: No cervical, axillary lymphadenopathy present. Psych: Mood and affect are normal. Normally interactive                                                                                        Assessment & Plan:  #  1 Medicare Wellness Exam; criteria met ; data entered #2 Problem List/Diagnoses reviewed #3 myalgia in mid -upper back Plan:  Assessments made/ Orders entered

## 2013-09-19 NOTE — Patient Instructions (Signed)
Your next office appointment will be determined based upon review of your pending labs &/ or x-rays. Those instructions will be transmitted to you through My Chart  or by mail if you're not using this system.   Minimal Blood Pressure Goal= AVERAGE < 140/90;  Ideal is an AVERAGE < 135/85. This AVERAGE should be calculated from @ least 5-7 BP readings taken @ different times of day on different days of week. You should not respond to isolated BP readings , but rather the AVERAGE for that week .Please bring your  blood pressure cuff to office visits to verify that it is reliable.It  can also be checked against the blood pressure device at the pharmacy. Finger or wrist cuffs are not dependable; an arm cuff is.  The best exercises for the  back include freestyle swimming, stretch aerobics, and yoga.

## 2013-09-25 ENCOUNTER — Other Ambulatory Visit: Payer: Medicare Other

## 2013-10-02 ENCOUNTER — Other Ambulatory Visit (HOSPITAL_BASED_OUTPATIENT_CLINIC_OR_DEPARTMENT_OTHER): Payer: Medicare Other

## 2013-10-02 ENCOUNTER — Ambulatory Visit (HOSPITAL_BASED_OUTPATIENT_CLINIC_OR_DEPARTMENT_OTHER): Payer: Medicare Other

## 2013-10-02 VITALS — BP 109/63 | HR 84 | Temp 97.8°F

## 2013-10-02 DIAGNOSIS — C55 Malignant neoplasm of uterus, part unspecified: Secondary | ICD-10-CM

## 2013-10-02 DIAGNOSIS — Z95828 Presence of other vascular implants and grafts: Secondary | ICD-10-CM

## 2013-10-02 LAB — COMPREHENSIVE METABOLIC PANEL (CC13)
ALK PHOS: 90 U/L (ref 40–150)
ALT: 10 U/L (ref 0–55)
AST: 20 U/L (ref 5–34)
Albumin: 4.1 g/dL (ref 3.5–5.0)
Anion Gap: 9 mEq/L (ref 3–11)
BUN: 19 mg/dL (ref 7.0–26.0)
CO2: 27 mEq/L (ref 22–29)
Calcium: 9.6 mg/dL (ref 8.4–10.4)
Chloride: 102 mEq/L (ref 98–109)
Creatinine: 0.8 mg/dL (ref 0.6–1.1)
Glucose: 99 mg/dl (ref 70–140)
Potassium: 4 mEq/L (ref 3.5–5.1)
SODIUM: 138 meq/L (ref 136–145)
Total Bilirubin: 1 mg/dL (ref 0.20–1.20)
Total Protein: 6.8 g/dL (ref 6.4–8.3)

## 2013-10-02 LAB — CBC WITH DIFFERENTIAL/PLATELET
BASO%: 1.2 % (ref 0.0–2.0)
Basophils Absolute: 0.1 10*3/uL (ref 0.0–0.1)
EOS ABS: 0.1 10*3/uL (ref 0.0–0.5)
EOS%: 1.4 % (ref 0.0–7.0)
HCT: 37.8 % (ref 34.8–46.6)
HGB: 12.7 g/dL (ref 11.6–15.9)
LYMPH#: 2.2 10*3/uL (ref 0.9–3.3)
LYMPH%: 26.1 % (ref 14.0–49.7)
MCH: 29.8 pg (ref 25.1–34.0)
MCHC: 33.5 g/dL (ref 31.5–36.0)
MCV: 89 fL (ref 79.5–101.0)
MONO#: 0.9 10*3/uL (ref 0.1–0.9)
MONO%: 10.2 % (ref 0.0–14.0)
NEUT%: 61.1 % (ref 38.4–76.8)
NEUTROS ABS: 5.2 10*3/uL (ref 1.5–6.5)
Platelets: 192 10*3/uL (ref 145–400)
RBC: 4.25 10*6/uL (ref 3.70–5.45)
RDW: 13.4 % (ref 11.2–14.5)
WBC: 8.5 10*3/uL (ref 3.9–10.3)

## 2013-10-02 MED ORDER — HEPARIN SOD (PORK) LOCK FLUSH 100 UNIT/ML IV SOLN
500.0000 [IU] | Freq: Once | INTRAVENOUS | Status: AC
  Administered 2013-10-02: 500 [IU] via INTRAVENOUS
  Filled 2013-10-02: qty 5

## 2013-10-02 MED ORDER — SODIUM CHLORIDE 0.9 % IJ SOLN
10.0000 mL | INTRAMUSCULAR | Status: DC | PRN
  Administered 2013-10-02: 10 mL via INTRAVENOUS
  Filled 2013-10-02: qty 10

## 2013-10-02 NOTE — Patient Instructions (Signed)

## 2013-10-04 ENCOUNTER — Telehealth: Payer: Self-pay

## 2013-10-04 NOTE — Telephone Encounter (Signed)
Message copied by Baruch Merl on Fri Oct 04, 2013  6:02 PM ------      Message from: Evlyn Clines P      Created: Wed Oct 02, 2013  8:33 PM       Please let patient know result is normal cbc and CMET ------

## 2013-10-04 NOTE — Telephone Encounter (Signed)
Told Amy Dominguez that the results of the cbc and c-met from 10-02-13 looked normal as noted below by Dr. Marko Plume.

## 2013-10-07 ENCOUNTER — Other Ambulatory Visit: Payer: Self-pay | Admitting: Internal Medicine

## 2013-10-18 ENCOUNTER — Other Ambulatory Visit: Payer: Self-pay | Admitting: Oncology

## 2013-10-21 ENCOUNTER — Telehealth: Payer: Self-pay | Admitting: Oncology

## 2013-10-21 NOTE — Telephone Encounter (Signed)
, °

## 2013-11-04 ENCOUNTER — Encounter (HOSPITAL_COMMUNITY): Payer: Self-pay

## 2013-11-04 ENCOUNTER — Ambulatory Visit (HOSPITAL_COMMUNITY)
Admission: RE | Admit: 2013-11-04 | Discharge: 2013-11-04 | Disposition: A | Discharge status: Home / Self Care | Payer: Medicare Other | Source: Ambulatory Visit | Attending: Oncology | Admitting: Oncology

## 2013-11-04 DIAGNOSIS — Z9221 Personal history of antineoplastic chemotherapy: Secondary | ICD-10-CM | POA: Insufficient documentation

## 2013-11-04 DIAGNOSIS — K7689 Other specified diseases of liver: Secondary | ICD-10-CM | POA: Insufficient documentation

## 2013-11-04 DIAGNOSIS — R918 Other nonspecific abnormal finding of lung field: Secondary | ICD-10-CM | POA: Insufficient documentation

## 2013-11-04 DIAGNOSIS — C55 Malignant neoplasm of uterus, part unspecified: Secondary | ICD-10-CM

## 2013-11-04 MED ORDER — IOHEXOL 300 MG/ML  SOLN
100.0000 mL | Freq: Once | INTRAMUSCULAR | Status: AC | PRN
  Administered 2013-11-04: 100 mL via INTRAVENOUS

## 2013-11-05 ENCOUNTER — Telehealth: Payer: Self-pay | Admitting: *Deleted

## 2013-11-05 ENCOUNTER — Telehealth: Payer: Self-pay

## 2013-11-05 NOTE — Telephone Encounter (Signed)
Spoke with the pt and she want to know if the labs that Dr. Linna Darner had ordered for her had been done when she had lab done for Dr. Marko Plume.  Informed the pt that I did not see where the labs that was ordered by Dr/ Linna Darner was done.  Pt stated she informed the person at the [facility] lab that the labs needed to be done.  Pt stated that she will come by and have the labs done at [facility] lab.  Informed the pt that the labs are in there for what she needs.//AB/CMA

## 2013-11-05 NOTE — Telephone Encounter (Signed)
Told Ms. Facile the result of her CT scan as noted below by Dr. Marko Plume.  Reviewed appointment times for 11-14-13 lab, flush, and visit. Confirmed with Diane in our lab that the lipid, ck, and TSH were drawn and sent to [facility] under Dr. Jaclyn Prime Name on 10-02-13. This information given to Ms. Faith.

## 2013-11-05 NOTE — Telephone Encounter (Signed)
Message copied by Baruch Merl on Tue Nov 05, 2013  3:25 PM ------      Message from: Gordy Levan      Created: Tue Nov 05, 2013  2:25 PM       Labs seen and need follow up: please let her know CT stable/ nothing that looks concerning. Keep apt as scheduled. ------

## 2013-11-05 NOTE — Telephone Encounter (Signed)
Patient phoned requesting Angie return her call regarding labs that she needs to have done.  CB# [phone]

## 2013-11-06 ENCOUNTER — Other Ambulatory Visit: Payer: Medicare Other

## 2013-11-06 ENCOUNTER — Ambulatory Visit: Payer: Medicare Other | Admitting: Oncology

## 2013-11-07 ENCOUNTER — Other Ambulatory Visit: Payer: Self-pay | Admitting: Internal Medicine

## 2013-11-10 ENCOUNTER — Other Ambulatory Visit: Payer: Self-pay | Admitting: Oncology

## 2013-11-12 ENCOUNTER — Encounter: Payer: Self-pay | Admitting: Internal Medicine

## 2013-11-12 ENCOUNTER — Telehealth: Payer: Self-pay

## 2013-11-12 ENCOUNTER — Telehealth: Payer: Self-pay | Admitting: *Deleted

## 2013-11-12 ENCOUNTER — Encounter: Payer: Self-pay | Admitting: Cardiovascular Disease

## 2013-11-12 ENCOUNTER — Ambulatory Visit (HOSPITAL_COMMUNITY): Payer: Medicare Other | Attending: Cardiovascular Disease | Admitting: Radiology

## 2013-11-12 ENCOUNTER — Ambulatory Visit (INDEPENDENT_AMBULATORY_CARE_PROVIDER_SITE_OTHER): Payer: Medicare Other | Admitting: Internal Medicine

## 2013-11-12 ENCOUNTER — Other Ambulatory Visit: Payer: Self-pay | Admitting: *Deleted

## 2013-11-12 VITALS — BP 115/66 | HR 81 | Ht 62.0 in | Wt 177.0 lb

## 2013-11-12 DIAGNOSIS — R011 Cardiac murmur, unspecified: Secondary | ICD-10-CM

## 2013-11-12 DIAGNOSIS — I495 Sick sinus syndrome: Secondary | ICD-10-CM

## 2013-11-12 DIAGNOSIS — I4892 Unspecified atrial flutter: Secondary | ICD-10-CM

## 2013-11-12 DIAGNOSIS — Z950 Presence of cardiac pacemaker: Secondary | ICD-10-CM

## 2013-11-12 DIAGNOSIS — I10 Essential (primary) hypertension: Secondary | ICD-10-CM

## 2013-11-12 LAB — MDC_IDC_ENUM_SESS_TYPE_INCLINIC
Battery Impedance: 164 Ohm
Battery Remaining Longevity: 121 mo
Brady Statistic AP VS Percent: 82 %
Brady Statistic AS VP Percent: 0 %
Brady Statistic AS VS Percent: 17 %
Date Time Interrogation Session: 20150317163725
Lead Channel Impedance Value: 609 Ohm
Lead Channel Pacing Threshold Amplitude: 0.5 V
Lead Channel Pacing Threshold Pulse Width: 0.4 ms
Lead Channel Pacing Threshold Pulse Width: 0.4 ms
Lead Channel Setting Sensing Sensitivity: 5.6 mV
MDC IDC MSMT BATTERY VOLTAGE: 2.8 V
MDC IDC MSMT LEADCHNL RA IMPEDANCE VALUE: 386 Ohm
MDC IDC MSMT LEADCHNL RA PACING THRESHOLD AMPLITUDE: 0.5 V
MDC IDC MSMT LEADCHNL RV SENSING INTR AMPL: 15.67 mV
MDC IDC SET LEADCHNL RA PACING AMPLITUDE: 2 V
MDC IDC SET LEADCHNL RV PACING AMPLITUDE: 2.5 V
MDC IDC SET LEADCHNL RV PACING PULSEWIDTH: 0.4 ms
MDC IDC STAT BRADY AP VP PERCENT: 1 %

## 2013-11-12 MED ORDER — LISINOPRIL 20 MG PO TABS
20.0000 mg | ORAL_TABLET | Freq: Every day | ORAL | Status: DC
Start: 2013-11-12 — End: 2014-01-13

## 2013-11-12 MED ORDER — POTASSIUM CHLORIDE CRYS ER 20 MEQ PO TBCR: EXTENDED_RELEASE_TABLET | ORAL | Status: DC

## 2013-11-12 MED ORDER — DABIGATRAN ETEXILATE MESYLATE 150 MG PO CAPS: 150.0000 mg | ORAL_CAPSULE | Freq: Two times a day (BID) | ORAL | Status: DC

## 2013-11-12 MED ORDER — POTASSIUM CHLORIDE CRYS ER 20 MEQ PO TBCR: 20.0000 meq | EXTENDED_RELEASE_TABLET | Freq: Once | ORAL | Status: DC

## 2013-11-12 MED ORDER — TORSEMIDE 20 MG PO TABS: 20.0000 mg | ORAL_TABLET | Freq: Every day | ORAL | Status: DC

## 2013-11-12 NOTE — Progress Notes (Signed)
Echocardiogram performed.  

## 2013-11-12 NOTE — Progress Notes (Signed)
Patient Care Team: Hendricks Limes, MD as PCP - General   HPI  Amy Dominguez is a 78 y.o. female is seen in followup for pacemaker implantation for symptomatic sinus node dysfunction. She has a history of mild aortic stenosis.   Her dyspnea was much improved. She is able to climb a flight of stairs     Her thromboembolic risk factors are noted for hypertension age and gender for CHADS VASC score of 3   She is completing chemotherapy a uterine cancer that was stage I but quite high grade; she feels like they're still residual effects of chemotherapy  They are movintg to [place].  She relates the story of their grandson    Past Medical History  Diagnosis Date  . Spinal stenosis     spinal stenosis LS spine  . Hypertension   . Asthma 2008 , 2012    RAD after BRONCHITIS    . Arthritis     hands, feet, hips, knees  . Headache(784.0)     allergy related  . Complication of anesthesia     slow to respond  . Dysrhythmia     atrial fib-Medtronic Pacemaker-on pradaxa-off for surgery since 9.25 2013  . Symptomatic bradycardia 2011    Insertion of Medtronic Pacer  . Cancer 05/2012    uterine leiomyosarcoma    Past Surgical History  Procedure Laterality Date  . Polypectomy  2008     negative 2011,Dr Sharlett Iles  . Hemorrhoid surgery    . Tonsillectomy    . Tubal ligation    . Insert / replace / remove pacemaker  DR [facility] pacemaker  09/2009  . Foot surgery      JOINT REPLACEMENT LT GREAT TOE   20 YRS AGO   . Cataract extraction      OS  . Lumbar laminectomy/decompression microdiscectomy  09/21/2011    Procedure: LUMBAR LAMINECTOMY/DECOMPRESSION MICRODISCECTOMY;  Surgeon: Eustace Moore, MD;  Location: [place] [facility];  Service: Neurosurgery;  Laterality: N/A;  lumbar laminectomy lumbar four-lumbar five  . Hysteroscopy w/d&c  05/29/2012    Procedure: DILATATION AND CURETTAGE /HYSTEROSCOPY;  Surgeon: Azalia Bilis, MD;  Location: [place] [facility];  Service:  Gynecology;  Laterality: N/A;  W/ Resectascope  . Dilation and evacuation  05/29/2012    Procedure: DILATATION AND EVACUATION;  Surgeon: Azalia Bilis, MD;  Location: [facility];  Service: Gynecology;  Laterality: N/A;    Current Outpatient Prescriptions  Medication Sig Dispense Refill  . cetirizine (ZYRTEC) 10 MG tablet Take 10 mg by mouth daily.      Marland Kitchen lisinopril (PRINIVIL,ZESTRIL) 20 MG tablet TAKE 1 TABLET BY MOUTH ONCE DAILY  30 tablet  0  . Multiple Vitamins-Minerals (MULTIVITAMINS THER. W/MINERALS) TABS Take 1 tablet by mouth daily.        . potassium chloride SA (K-DUR,KLOR-CON) 20 MEQ tablet Take one tablet by mouth twice daily as directed.  180 tablet  2  . PRADAXA 150 MG CAPS capsule TAKE ONE CAPSULE BY MOUTH EVERY 12 HOURS  180 capsule  0  . torsemide (DEMADEX) 20 MG tablet Take 1 tablet (20 mg total) by mouth daily.  90 tablet  3  . vitamin E 400 UNIT capsule Take 400 Units by mouth daily.       . Calcium Carbonate-Vitamin D (CALCIUM 600 + D PO) Take 2 tablets by mouth 2 (two) times daily.        . [DISCONTINUED] furosemide (LASIX) 20  MG tablet Take 20 mg by mouth daily.         No current facility-administered medications for this visit.   Facility-Administered Medications Ordered in Other Visits  Medication Dose Route Frequency Provider Last Rate Last Dose  . sodium chloride 0.9 % injection 10 mL  10 mL Intracatheter PRN Lennis Marion Downer, MD        Allergies  Allergen Reactions  . Tylenol [Acetaminophen] Other (See Comments)    Brand name tylenol causes headache    Review of Systems negative except from HPI and PMH  Physical Exam BP 115/66  Pulse 81  Ht 5\' 2"  (1.575 m)  Wt 177 lb (80.287 kg)  BMI 32.37 kg/m2  LMP 09/14/2011 Well developed and well nourished in no acute distress HENT normal E scleral and icterus clear Neck Supple JVP flat; carotids brisk and full Clear to ausculation  Regular rate and rhythm,2/6  Soft with active bowel sounds No clubbing  cyanosis  Edema Alert and oriented, grossly normal motor and sensory function Skin Warm and Dry    Assessment and  Plan  Aortic stenosis-mild  Sinus node dysfunction/chronotropic incompetence  Pacemaker-Medtronic  Atrial fibrillation-paroxysmal   Stable  Continue current medications. Await echo results

## 2013-11-12 NOTE — Telephone Encounter (Signed)
Pt called states she was billed from [facility] lab for labs that were ordered by Dr Linna Darner on 1.22.2015.  She further states [facility] says Dr Linna Darner needs to call to request the lab results, their number is [phone].  pts account # is [id number].  The labs were Lipid panel, Creatinine, TSH.

## 2013-11-12 NOTE — Telephone Encounter (Signed)
Patient had questions concerning labs. LM for CB and also gave number for [facility] office in case she needs [facility].

## 2013-11-12 NOTE — Patient Instructions (Addendum)
Your physician recommends that you continue on your current medications as directed. Please refer to the Current Medication list given to you today.  No follow up needed at this time due to your relocating

## 2013-11-13 ENCOUNTER — Ambulatory Visit: Payer: Medicare Other | Admitting: Gynecologic Oncology

## 2013-11-13 NOTE — Telephone Encounter (Signed)
Please get results; not in [place], this is strange. Why would [facility] not release labs? Thanks, [employer]

## 2013-11-14 ENCOUNTER — Other Ambulatory Visit (HOSPITAL_BASED_OUTPATIENT_CLINIC_OR_DEPARTMENT_OTHER): Payer: Medicare Other

## 2013-11-14 ENCOUNTER — Ambulatory Visit (HOSPITAL_BASED_OUTPATIENT_CLINIC_OR_DEPARTMENT_OTHER): Payer: Medicare Other | Admitting: Oncology

## 2013-11-14 ENCOUNTER — Encounter: Payer: Self-pay | Admitting: Oncology

## 2013-11-14 ENCOUNTER — Ambulatory Visit (HOSPITAL_BASED_OUTPATIENT_CLINIC_OR_DEPARTMENT_OTHER): Payer: Medicare Other

## 2013-11-14 VITALS — BP 126/76 | HR 89 | Temp 97.8°F | Resp 18 | Ht 62.0 in | Wt 176.9 lb

## 2013-11-14 DIAGNOSIS — I10 Essential (primary) hypertension: Secondary | ICD-10-CM

## 2013-11-14 DIAGNOSIS — I4891 Unspecified atrial fibrillation: Secondary | ICD-10-CM

## 2013-11-14 DIAGNOSIS — C55 Malignant neoplasm of uterus, part unspecified: Secondary | ICD-10-CM

## 2013-11-14 DIAGNOSIS — Z95828 Presence of other vascular implants and grafts: Secondary | ICD-10-CM

## 2013-11-14 LAB — COMPREHENSIVE METABOLIC PANEL (CC13)
ALT: 7 U/L (ref 0–55)
ANION GAP: 10 meq/L (ref 3–11)
AST: 18 U/L (ref 5–34)
Albumin: 3.7 g/dL (ref 3.5–5.0)
Alkaline Phosphatase: 93 U/L (ref 40–150)
BUN: 24 mg/dL (ref 7.0–26.0)
CALCIUM: 9.3 mg/dL (ref 8.4–10.4)
CHLORIDE: 102 meq/L (ref 98–109)
CO2: 28 meq/L (ref 22–29)
CREATININE: 1 mg/dL (ref 0.6–1.1)
Glucose: 114 mg/dl (ref 70–140)
Potassium: 4.3 mEq/L (ref 3.5–5.1)
Sodium: 140 mEq/L (ref 136–145)
Total Bilirubin: 0.46 mg/dL (ref 0.20–1.20)
Total Protein: 6.7 g/dL (ref 6.4–8.3)

## 2013-11-14 LAB — CBC WITH DIFFERENTIAL/PLATELET
BASO%: 0.7 % (ref 0.0–2.0)
Basophils Absolute: 0.1 10*3/uL (ref 0.0–0.1)
EOS%: 1.8 % (ref 0.0–7.0)
Eosinophils Absolute: 0.2 10*3/uL (ref 0.0–0.5)
HCT: 36.3 % (ref 34.8–46.6)
HEMOGLOBIN: 12 g/dL (ref 11.6–15.9)
LYMPH#: 3.2 10*3/uL (ref 0.9–3.3)
LYMPH%: 32.7 % (ref 14.0–49.7)
MCH: 29 pg (ref 25.1–34.0)
MCHC: 33.1 g/dL (ref 31.5–36.0)
MCV: 87.5 fL (ref 79.5–101.0)
MONO#: 0.8 10*3/uL (ref 0.1–0.9)
MONO%: 7.8 % (ref 0.0–14.0)
NEUT#: 5.5 10*3/uL (ref 1.5–6.5)
NEUT%: 57 % (ref 38.4–76.8)
PLATELETS: 195 10*3/uL (ref 145–400)
RBC: 4.15 10*6/uL (ref 3.70–5.45)
RDW: 14 % (ref 11.2–14.5)
WBC: 9.7 10*3/uL (ref 3.9–10.3)

## 2013-11-14 MED ORDER — HEPARIN SOD (PORK) LOCK FLUSH 100 UNIT/ML IV SOLN
500.0000 [IU] | Freq: Once | INTRAVENOUS | Status: AC
Start: 2013-11-14 — End: 2013-11-14
  Administered 2013-11-14: 500 [IU] via INTRAVENOUS
  Filled 2013-11-14: qty 5

## 2013-11-14 MED ORDER — SODIUM CHLORIDE 0.9 % IJ SOLN
10.0000 mL | INTRAMUSCULAR | Status: DC | PRN
  Administered 2013-11-14: 10 mL via INTRAVENOUS
  Filled 2013-11-14: qty 10

## 2013-11-14 NOTE — Progress Notes (Signed)
OFFICE PROGRESS NOTE   11/14/2013   Physicians:Paola Gehrig, [name], [name] / [name], [name] / [name], [name], [name], [name] (podiatry), [name], [name]   INTERVAL HISTORY:  Patient is seen, together with husband, in scheduled follow up of uterine leiomyosarcoma, on observation since completing adjuvant gemzar taxotere in Feb 2014. She had CT CAP 11-05-13 which is stable, including tiny lung nodules, without evidence of recurrent or metastatic disease. She is to see [name] on 01-09-14. Patient has felt well other than degenerative right knee problems, better since recent injection by Dr Despina Hick but probably will need surgery this fall. Energy is mostly good, no respiratory or GI complaints, no bleeding, no new or different pain otherwise, no LE swelling.  She has PAC, which she will be glad to have removed when [name] is comfortable with that, vs keeping in for orthopedic surgery ~ Sept (she will call Dr Alusio's office to ask if he would use PAC). PAC was flushed today.   ONCOLOGIC HISTORY   Leiomyosarcoma of uterus   06/15/2012 Initial Diagnosis Leiomyosarcoma of uterus, IA s/p TRH, BSO.   08/06/2012 - 10/22/2012 Chemotherapy 4 cycles of taxotere and gemictabine  Patient had vaginal bleeding Sept/ early Oct 2013. She was seen by gyn within 2-3 days, with ultrasound done, then [facility] with hysteroscopic myomectomy and polypectomy by [name] on 05-29-12, path [id number] with high grade leiomyosarcoma with >10 mitoses/ 10 HPF, 3.5 cm into outer half of myometrium, no LVSI. This pathology was confirmed at [facility]. She was referred to [name], had CT CAP done in [facility] system 06-12-12 with 2 indeterminate left lung nodules largest 6 mm and 3 cm posterior uterine myometrial mass without apparent extrauterine extension or metastatic disease. She had robotic assisted laparoscopic hysterectomy, bilateral salpingo-oophorectomy 06/15/12 at [facility]  by [name] with findings of physiologic adhesions of right adnexum to appendix; small fibroid noted on uterus, otherwise normal abdominal survey; no evidence of extrauterine disease on evaluation. Pathology from the [facility] surgery [phone]) had high grade leiomyosarcoma with > 20 mitoses/10 HPF and invasion into outer half of myometrium, negative LVSI and benign ovaries/tubes. Gyn oncology recommended gemzar/ taxotere off study due to her high risk pathology.  4 cycles of gemzar/taxotere were given from 08-06-12 thru 10-22-12, with dose reduction of gemzar due to skin toxicity and also cytopenias.   Review of systems as above, also: No recent fever or symptoms of infection. No problems with pacemaker, no different cardiac symptoms. Remainder of 10 point Review of Systems negative.  Objective:  Vital signs in last 24 hours:  BP 126/76  Pulse 89  Temp(Src) 97.8 F (36.6 C) (Oral)  Resp 18  Ht 5\' 2"  (1.575 m)  Wt 176 lb 14.4 oz (80.241 kg)  BMI 32.35 kg/m2  LMP 09/14/2011 weight is up 5 lbs.  Alert, oriented and appropriate. Ambulatory without assistance.   HEENT:PERRL, sclerae not icteric. Oral mucosa moist without lesions, posterior pharynx clear.  Neck supple. No JVD.  Lymphatics:no cervical,suraclavicular, axillary or inguinal adenopathy Resp: clear to auscultation bilaterally and normal percussion bilaterally Cardio: regular rate and rhythm. No gallop. GI: soft, nontender, not distended, no mass or organomegaly. Normally active bowel sounds. Surgical incision not remarkable. Musculoskeletal/ Extremities: without pitting edema, cords, tenderness Neuro: no peripheral neuropathy. Otherwise nonfocal. Psych normal mood and affect Skin without rash, ecchymosis, petechiae Portacath-without erythema or tenderness  Lab Results:  Results for orders placed in visit on 11/14/13  CBC WITH DIFFERENTIAL  Result Value Ref Range   WBC 9.7  3.9 - 10.3 10e3/uL   NEUT# 5.5  1.5 - 6.5  10e3/uL   HGB 12.0  11.6 - 15.9 g/dL   HCT 36.3  34.8 - 46.6 %   Platelets 195  145 - 400 10e3/uL   MCV 87.5  79.5 - 101.0 fL   MCH 29.0  25.1 - 34.0 pg   MCHC 33.1  31.5 - 36.0 g/dL   RBC 4.15  3.70 - 5.45 10e6/uL   RDW 14.0  11.2 - 14.5 %   lymph# 3.2  0.9 - 3.3 10e3/uL   MONO# 0.8  0.1 - 0.9 10e3/uL   Eosinophils Absolute 0.2  0.0 - 0.5 10e3/uL   Basophils Absolute 0.1  0.0 - 0.1 10e3/uL   NEUT% 57.0  38.4 - 76.8 %   LYMPH% 32.7  14.0 - 49.7 %   MONO% 7.8  0.0 - 14.0 %   EOS% 1.8  0.0 - 7.0 %   BASO% 0.7  0.0 - 2.0 %  COMPREHENSIVE METABOLIC PANEL (XB14)      Result Value Ref Range   Sodium 140  136 - 145 mEq/L   Potassium 4.3  3.5 - 5.1 mEq/L   Chloride 102  98 - 109 mEq/L   CO2 28  22 - 29 mEq/L   Glucose 114  70 - 140 mg/dl   BUN 24.0  7.0 - 26.0 mg/dL   Creatinine 1.0  0.6 - 1.1 mg/dL   Total Bilirubin 0.46  0.20 - 1.20 mg/dL   Alkaline Phosphatase 93  40 - 150 U/L   AST 18  5 - 34 U/L   ALT 7  0 - 55 U/L   Total Protein 6.7  6.4 - 8.3 g/dL   Albumin 3.7  3.5 - 5.0 g/dL   Calcium 9.3  8.4 - 10.4 mg/dL   Anion Gap 10  3 - 11 mEq/L     Studies/Results: CT CHEST FINDINGS  Right-sided Port-A-Cath in place with tip in the distal SVC.  Left-sided pacer in place. 2 mm left upper lobe pulmonary nodule  image 10 is reidentified. 5 mm left lower lobe pulmonary nodule  image 41 is stable. No new pulmonary consolidation, mass, or nodule  is otherwise identified. Central airways are patent.  Heart size is normal. No pericardial or pleural effusion. No  lymphadenopathy. Moderate atheromatous aortic and coronary arterial  calcifications are reidentified. Great vessels are normal in  caliber.  CT ABDOMEN AND PELVIS FINDINGS  Too small to characterize 4 mm posterior segment right hepatic lobe  hypodense lesion image 56 is stable. Right upper renal pole cortical  cyst image 63 is stable, as are too small to characterize bilateral  renal cortical hypodense lesions  statistically most likely cysts but  not further characterized. Gallbladder, adrenal glands, spleen, and  pancreas are unremarkable. Moderate atheromatous aortic  calcification without aneurysm is present. No free air, free fluid,  or lymphadenopathy.  Status post hysterectomy. Neither ovary identified but no adnexal  mass is seen. The appendix is not identified but there is no  secondary evidence for acute appendicitis. No bowel wall thickening  or focal segmental dilatation. The bladder is normal. No pelvic mass  is identified. Bladder is decompressed but otherwise unremarkable.  Degenerative changes are noted in the hips and spine. Mild leftward  curvature centered at L1 is noted. No lytic or sclerotic osseous  lesion. No compression deformity.  IMPRESSION:  No evidence for metastatic disease to  the chest, abdomen, or pelvis.  Status post total hysterectomy.  Stable pulmonary nodules as above, amenable to followup at future  exams to determine presumed 2 year stability and probable benignity  by imaging criteria.  Stable 4 mm right hepatic lobe hypodense lesion, also amenable to  followup back presumed future restaging exams    [facility]  Medications: I have reviewed the patient's current medications.  DISCUSSION: CT information discussed. PAC discussed as above -- if kept in she will need additional flushes up until she goes out of state for summer, with flushes there on appropriate schedule until back in [place].  Note patient and husband are considering move back closer to their children, tho will not be doing so in very near future.  Assessment/Plan:  1.IA high grade leiomyosarcoma of uterus: Completed 4 cycles of adjuvant gemzar/taxotere on 10-22-12. CT CAP stable 11-05-13. She will keep appointment with gyn oncology as scheduled and I will see her back after she returns to [facility] in Aug or Sept.  2.anemia and other blood counts recovered  3.PAC in:  will flush every 6-8 weeks while not otherwise used. I will try to coordinate flushes with visits as possible.  4.atrial fibrillation and pacer in, back on pradaxa  5.actively trying to stop smoking, using nicotine patch. Husband has also stopped smoking now.  6. .HTN, asthma, borderline hypothyroid, chronic bursitis hips, spinal stenosis 7.degenerative arthritis right knee, may need surgery    Patient and husband understood discussion well and are in agreement with plan. She will let us know whether or not the [facility] will stay in over next several months.   Elek Holderness P, MD   11/14/2013, 3:58 PM

## 2013-11-14 NOTE — Telephone Encounter (Signed)
Spoke with the pt and informed her that I will call [facility] lab to get her lab results.//AB/CMA

## 2013-11-17 ENCOUNTER — Other Ambulatory Visit: Payer: Self-pay | Admitting: Internal Medicine

## 2013-11-18 ENCOUNTER — Telehealth: Payer: Self-pay | Admitting: Oncology

## 2013-11-18 ENCOUNTER — Other Ambulatory Visit: Payer: Self-pay

## 2013-11-18 NOTE — Telephone Encounter (Signed)
lvm for pt fregarding to April and May appt.....mailed pt appt sched/avs and letter

## 2013-11-20 ENCOUNTER — Other Ambulatory Visit: Payer: Medicare Other

## 2013-11-22 ENCOUNTER — Other Ambulatory Visit: Payer: Medicare Other

## 2013-11-22 ENCOUNTER — Ambulatory Visit: Payer: Medicare Other | Admitting: Oncology

## 2013-11-25 ENCOUNTER — Other Ambulatory Visit: Payer: Medicare Other | Admitting: Lab

## 2013-11-28 ENCOUNTER — Telehealth: Payer: Self-pay | Admitting: Oncology

## 2013-11-28 NOTE — Telephone Encounter (Signed)
, °

## 2013-12-02 ENCOUNTER — Encounter: Payer: Self-pay | Admitting: Internal Medicine

## 2013-12-04 ENCOUNTER — Ambulatory Visit: Payer: Medicare Other | Admitting: Oncology

## 2013-12-30 ENCOUNTER — Encounter (HOSPITAL_COMMUNITY): Admission: RE | Payer: Self-pay | Source: Ambulatory Visit

## 2013-12-30 ENCOUNTER — Inpatient Hospital Stay (HOSPITAL_COMMUNITY): Admission: RE | Admit: 2013-12-30 | Payer: Medicare Other | Source: Ambulatory Visit | Admitting: Orthopedic Surgery

## 2013-12-30 SURGERY — ARTHROPLASTY, KNEE, TOTAL
Anesthesia: Choice | Site: Knee | Laterality: Right

## 2014-01-09 ENCOUNTER — Encounter: Payer: Self-pay | Admitting: Gynecologic Oncology

## 2014-01-09 ENCOUNTER — Ambulatory Visit: Payer: Medicare Other | Attending: Gynecologic Oncology | Admitting: Gynecologic Oncology

## 2014-01-09 VITALS — BP 121/57 | HR 52 | Temp 98.1°F | Resp 20 | Ht 62.0 in | Wt 176.8 lb

## 2014-01-09 DIAGNOSIS — C55 Malignant neoplasm of uterus, part unspecified: Secondary | ICD-10-CM

## 2014-01-09 DIAGNOSIS — Z90710 Acquired absence of both cervix and uterus: Secondary | ICD-10-CM | POA: Insufficient documentation

## 2014-01-09 DIAGNOSIS — R0602 Shortness of breath: Secondary | ICD-10-CM | POA: Insufficient documentation

## 2014-01-09 DIAGNOSIS — Z9079 Acquired absence of other genital organ(s): Secondary | ICD-10-CM | POA: Insufficient documentation

## 2014-01-09 DIAGNOSIS — J984 Other disorders of lung: Secondary | ICD-10-CM | POA: Insufficient documentation

## 2014-01-09 DIAGNOSIS — Z87891 Personal history of nicotine dependence: Secondary | ICD-10-CM | POA: Insufficient documentation

## 2014-01-09 DIAGNOSIS — C549 Malignant neoplasm of corpus uteri, unspecified: Secondary | ICD-10-CM | POA: Insufficient documentation

## 2014-01-09 NOTE — Progress Notes (Signed)
Called IR, pt will need to be off Pradax x 2-3 days prior to [facility] bring removed. IR will contact Dr. Caryl Comes for clearance and call pt with appt date/time

## 2014-01-09 NOTE — Patient Instructions (Signed)
Please call us to send records once you're established in [place]. The phone number is [phone]

## 2014-01-09 NOTE — Progress Notes (Signed)
Consult Note: Gyn-Onc  Amy Dominguez 78 y.o. female  CC:  Chief Complaint  Patient presents with  . Leiomyosarcoma of uterus    Follow up    HPI: Patient had vaginal bleeding Sept/ early Oct 2013. She was seen by gyn within 2-3 days, with ultrasound done, then [facility] with hysteroscopic myomectomy and polypectomy by Dr Charlies Constable on 05-29-12, pathology with high grade leiomyosarcoma with >10 mitoses/ 10 HPF, 3.5 cm into outer half of myometrium, no LVSI. This pathology was confirmed at [facility]. She had CT CAP done in [place] system 06-12-12 with 2 indeterminate left lung nodules largest 6 mm and 3 cm posterior uterine myometrial mass without apparent extrauterine extension or metastatic disease. She went to robotic assisted laparoscopic hysterectomy, bilateral salpingo-oophorectomy 06/15/12 at [facility] with findings of physiologic adhesions of right adnexum to appendix; small fibroid noted on uterus, otherwise normal abdominal survey; no evidence of extrauterine disease on evaluation. Pathology from the [facility] surgery [phone]) had high grade leiomyosarcoma with > 20 mitoses/10 HPF and invasion into outer half of myometrium, negative LVSI and benign ovaries/tubes. Gyn oncology recommended gemzar/ taxotere off study due to her high risk pathology. 4 cycles of gemzar/taxotere were given from 08-06-12 thru 10-22-12, with dose reduction of gemzar due to skin toxicity and also with cytopenias which required PRBC transfusion and holding pradaxa in addition to neulasta.    Interval History:  I last saw her in 12/14 she was seen by Dr. Marko Plume in 3/15. At that time CT scan from [facility] showed no pelvic tumor, there is a similar lesion that was not previously seen and a stable small left lung nodule. As recommended she'll follow up in 6 months. She had CT scan 3/15 that revealed:  FINDINGS:  CT CHEST FINDINGS  Right-sided Port-A-Cath in place with tip in the distal SVC. Left-sided pacer in place. 2 mm left upper lobe  pulmonary nodule image 10 is reidentified. 5 mm left lower lobe pulmonary nodule image 41 is stable. No new pulmonary consolidation, mass, or nodule is otherwise identified. Central airways are patent. Heart size is normal. No pericardial or pleural effusion. No lymphadenopathy. Moderate atheromatous aortic and coronary arterial calcifications are reidentified. Great vessels are normal in caliber.  CT ABDOMEN AND PELVIS FINDINGS  Too small to characterize 4 mm posterior segment right hepatic lobe hypodense lesion image 56 is stable. Right upper renal pole cortical cyst image 63 is stable, as are too small to characterize bilateral renal cortical hypodense lesions statistically most likely cysts but not further characterized. Gallbladder, adrenal glands, spleen, and pancreas are unremarkable. Moderate atheromatous aortic calcification without aneurysm is present. No free air, free fluid, or lymphadenopathy. Status post hysterectomy. Neither ovary identified but no adnexal mass is seen. The appendix is not identified but there is no secondary evidence for acute appendicitis. No bowel wall thickening or focal segmental dilatation. The bladder is normal. No pelvic mass is identified. Bladder is decompressed but otherwise unremarkable. Degenerative changes are noted in the hips and spine. Mild leftward curvature centered at L1 is noted. No lytic or sclerotic osseous lesion. No compression deformity.  IMPRESSION:  No evidence for metastatic disease to the chest, abdomen, or pelvis.  Status post total hysterectomy.  Stable pulmonary nodules as above, amenable to followup at future exams to determine presumed 2 year stability and probable benignity by imaging criteria.  Stable 4 mm right hepatic lobe hypodense lesion, also amenable to followup back presumed future restaging exams.   Interval History: She is overall doing  quite well. She was to have her Port-A-Cath removed next week. There can be moving back to  [place] will be leaving this area moving back to [place]. She does have some shortness of breath with exertion which is nothing new. She states that she bruises a delay in her pacemaker taking them. She's been no chest pain worse diaphoresis with this. She has a change about bladder habits push any vaginal bleeding. She continues to have back pain which is stable. She sees her chiropractor and uses a home massage machine.  Review of Systems:  Constitutional:Denies fever. Skin: No rash, sores, jaundice, itching, or dryness.  Cardiovascular: No chest pain, shortness of breath, or edema  Pulmonary: No cough or wheeze.  Gastro Intestinal: No nausea, vomiting, constipation, or diarrhea reported. No bright red blood per rectum or change in bowel movement.  Genitourinary: No frequency, urgency, or dysuria.  Denies vaginal bleeding and discharge.  Musculoskeletal: + back and and right knee pain Neurologic: No weakness,+ numbness tips of her toes, or change in gait.  Psychology: No changes   Current Meds:  Outpatient Encounter Prescriptions as of 01/09/2014  Medication Sig  . Calcium Carbonate-Vitamin D (CALCIUM 600 + D PO) Take 2 tablets by mouth 2 (two) times daily.    . cetirizine (ZYRTEC) 10 MG tablet Take 10 mg by mouth daily.  . dabigatran (PRADAXA) 150 MG CAPS capsule Take 1 capsule (150 mg total) by mouth 2 (two) times daily.  Marland Kitchen lisinopril (PRINIVIL,ZESTRIL) 20 MG tablet Take 1 tablet (20 mg total) by mouth daily.  . Multiple Vitamins-Minerals (MULTIVITAMINS THER. W/MINERALS) TABS Take 1 tablet by mouth daily.    . potassium chloride SA (K-DUR,KLOR-CON) 20 MEQ tablet Take 1 tablet (20 mEq total) by mouth once.  . torsemide (DEMADEX) 20 MG tablet TAKE 1 TABLET BY MOUTH TWICE A DAY AS DIRECTED  . vitamin E 400 UNIT capsule Take 400 Units by mouth daily.     Allergy:  Allergies  Allergen Reactions  . Tylenol [Acetaminophen] Other (See Comments)    Brand name tylenol causes headache     Social Hx:   History   Social History  . Marital Status: Married    Spouse Name: N/A    Number of Children: N/A  . Years of Education: N/A   Occupational History  . Retired Therapist, sports    Social History Main Topics  . Smoking status: Former Smoker -- 0.25 packs/day for 54 years    Types: Cigarettes    Quit date: 06/05/2013  . Smokeless tobacco: Not on file     Comment: smoked 1957-2014, up to < 1 ppd (off 5 years )  . Alcohol Use: 1.8 oz/week    3 Glasses of wine per week     Comment:  occasionally  . Drug Use: No  . Sexual Activity: Not on file   Other Topics Concern  . Not on file   Social History Narrative   No added salt          Past Surgical Hx:  Past Surgical History  Procedure Laterality Date  . Polypectomy  2008     negative 2011,Dr Sharlett Iles  . Hemorrhoid surgery    . Tonsillectomy    . Tubal ligation    . Insert / replace / remove pacemaker  [facility] pacemaker  09/2009  . Foot surgery      JOINT REPLACEMENT LT GREAT TOE   20 YRS AGO   . Cataract extraction  OS  . Lumbar laminectomy/decompression microdiscectomy  09/21/2011    Procedure: LUMBAR LAMINECTOMY/DECOMPRESSION MICRODISCECTOMY;  Surgeon: Eustace Moore, MD;  Location: [place] [facility];  Service: Neurosurgery;  Laterality: N/A;  lumbar laminectomy lumbar four-lumbar five  . Hysteroscopy w/d&c  05/29/2012    Procedure: DILATATION AND CURETTAGE /HYSTEROSCOPY;  Surgeon: Azalia Bilis, MD;  Location: [place] [facility];  Service: Gynecology;  Laterality: N/A;  W/ Resectascope  . Dilation and evacuation  05/29/2012    Procedure: DILATATION AND EVACUATION;  Surgeon: Azalia Bilis, MD;  Location: [place] [facility];  Service: Gynecology;  Laterality: N/A;    Past Medical Hx:  Past Medical History  Diagnosis Date  . Spinal stenosis     spinal stenosis LS spine  . Hypertension   . Asthma 2008 , 2012    RAD after BRONCHITIS    . Arthritis     hands, feet, hips, knees  . Headache(784.0)     allergy related   . Complication of anesthesia     slow to respond  . Dysrhythmia     atrial fib-Medtronic Pacemaker-on pradaxa-off for surgery since 9.25 2013  . Symptomatic bradycardia 2011    Insertion of Medtronic Pacer  . Cancer 05/2012    uterine leiomyosarcoma    Oncology Hx:    Leiomyosarcoma of uterus   06/15/2012 Initial Diagnosis Leiomyosarcoma of uterus, IA s/p TRH, BSO.   08/06/2012 - 10/22/2012 Chemotherapy 4 cycles of taxotere and gemictabine    Family Hx:  Family History  Problem Relation Age of Onset  . Stroke Mother 30    post valve replacement; warfarin missed  . Heart attack Father 12  . Breast cancer Sister   . Deep vein thrombosis Daughter     Factor S non functional  . Diabetes Neg Hx     Vitals:  Blood pressure 121/57, pulse 52, temperature 98.1 F (36.7 C), temperature source Oral, resp. rate 20, height 5\' 2"  (1.575 m), weight 176 lb 12.8 oz (80.196 kg), last menstrual period 09/14/2011.  Physical Exam: Well-nourished well-developed female in no acute distress.   Neck: Supple, no lymphadenopathy, no thyromegaly.   Cardiovascular: Regular rate rhythm.   Abdomen: Well-healed surgical incision. There is no evidence of any incisional hernias. Abdomen is soft, nontender, there is no palpable mass or hepatosplenomegaly.   Groins: No lymphadenopathy.   Extremities: Some mild erythema and peeling on the dorsum of the hands. She has 1+ nonpitting edema equal bilaterally.   Pelvic: Normal external female genitalia. Bimanual examination reveals no masses or nodularity. Rectal confirms   Assessment/Plan:  78 year old with stage IA high-grade leiomyosarcoma with greater than 20 mitoses per 10 high power field 2 status post 4 cycles of adjuvant chemotherapy with gemcitabine and Taxotere. Posttreatment CT scan is very reassuring. There was a questionable subtle change in the liver and they recommend repeat scan which was stable at 6 months. We will continue to follow the  pulmonary lesions expectantly. This most recent scans to stability which is excellent. She will be moving back to [place]. She was given emergent oncologist in [place]. When she establishes an appointment she will call and we will have records sent. Return when necessary basis. We will schedule her removal. She'll need to hold her pradaxa for 3 days        Blair Lundeen A. Alycia Rossetti, MD 01/09/2014, 3:49 PM

## 2014-01-10 ENCOUNTER — Telehealth: Payer: Self-pay | Admitting: Internal Medicine

## 2014-01-10 NOTE — Telephone Encounter (Signed)
New message    Patient need to hold pradaxa for 2 days for upcoming port removal.

## 2014-01-13 ENCOUNTER — Other Ambulatory Visit: Payer: Self-pay | Admitting: Radiology

## 2014-01-13 ENCOUNTER — Telehealth: Payer: Self-pay | Admitting: Internal Medicine

## 2014-01-13 ENCOUNTER — Encounter (HOSPITAL_COMMUNITY): Payer: Self-pay | Admitting: Pharmacy Technician

## 2014-01-13 NOTE — Telephone Encounter (Signed)
Tiffany can be reached at [phone]. She thinks this is poor patient care on our part.

## 2014-01-13 NOTE — Telephone Encounter (Signed)
Tiffany with [facility] ( IR) is calling wanting to know if patient can stop Prodaxa for a procedure. She is very UPSET that no one called her back on Friday. She wants a call ASAP!

## 2014-01-13 NOTE — Telephone Encounter (Signed)
Please see 5/18 telephone note

## 2014-01-13 NOTE — Telephone Encounter (Signed)
dabigitran can be stopped as necessary for procedure  48 hrs for peripheral and 96 hrs for neurological   i called number lsited and left message  No last name for epic note Amy Dominguez

## 2014-01-13 NOTE — Telephone Encounter (Signed)
Called to make sure they had received Dr. Olin Pia message and new orders. Tiffany confirmed they received recommendations from Dr. Caryl Comes.

## 2014-01-13 NOTE — Telephone Encounter (Addendum)
Called Tiffany at [facility] interventional radiology explaining that on Friday I had to leave early with an emergency and left follow up, with another nurse, to be addressed - but it did not get addressed with Dr. Caryl Comes. (I explained that I informed our supervisor of this also) I let her know that I will discuss this with Dr. Caryl Comes today and get back with her. She was agreeable to this.

## 2014-01-14 ENCOUNTER — Ambulatory Visit (HOSPITAL_COMMUNITY)
Admission: RE | Admit: 2014-01-14 | Discharge: 2014-01-14 | Disposition: A | Discharge status: Home / Self Care | Payer: Medicare Other | Source: Ambulatory Visit | Attending: Gynecologic Oncology | Admitting: Gynecologic Oncology

## 2014-01-14 ENCOUNTER — Encounter (HOSPITAL_COMMUNITY): Payer: Self-pay

## 2014-01-14 DIAGNOSIS — Z79899 Other long term (current) drug therapy: Secondary | ICD-10-CM | POA: Insufficient documentation

## 2014-01-14 DIAGNOSIS — Z950 Presence of cardiac pacemaker: Secondary | ICD-10-CM | POA: Insufficient documentation

## 2014-01-14 DIAGNOSIS — I4891 Unspecified atrial fibrillation: Secondary | ICD-10-CM | POA: Insufficient documentation

## 2014-01-14 DIAGNOSIS — C55 Malignant neoplasm of uterus, part unspecified: Secondary | ICD-10-CM

## 2014-01-14 DIAGNOSIS — I10 Essential (primary) hypertension: Secondary | ICD-10-CM | POA: Insufficient documentation

## 2014-01-14 DIAGNOSIS — Z87891 Personal history of nicotine dependence: Secondary | ICD-10-CM | POA: Insufficient documentation

## 2014-01-14 DIAGNOSIS — J45909 Unspecified asthma, uncomplicated: Secondary | ICD-10-CM | POA: Insufficient documentation

## 2014-01-14 DIAGNOSIS — Z452 Encounter for adjustment and management of vascular access device: Secondary | ICD-10-CM | POA: Insufficient documentation

## 2014-01-14 LAB — CBC
HCT: 39 % (ref 36.0–46.0)
HEMOGLOBIN: 13 g/dL (ref 12.0–15.0)
MCH: 29.2 pg (ref 26.0–34.0)
MCHC: 33.3 g/dL (ref 30.0–36.0)
MCV: 87.6 fL (ref 78.0–100.0)
PLATELETS: 193 10*3/uL (ref 150–400)
RBC: 4.45 MIL/uL (ref 3.87–5.11)
RDW: 13.9 % (ref 11.5–15.5)
WBC: 7.9 10*3/uL (ref 4.0–10.5)

## 2014-01-14 LAB — PROTIME-INR
INR: 0.96 (ref 0.00–1.49)
Prothrombin Time: 12.6 seconds (ref 11.6–15.2)

## 2014-01-14 LAB — APTT: aPTT: 30 seconds (ref 24–37)

## 2014-01-14 MED ORDER — SODIUM CHLORIDE 0.9 % IV SOLN
INTRAVENOUS | Status: DC
  Administered 2014-01-14: 13:00:00 via INTRAVENOUS

## 2014-01-14 MED ORDER — FENTANYL CITRATE 0.05 MG/ML IJ SOLN
INTRAMUSCULAR | Status: AC
  Filled 2014-01-14: qty 2

## 2014-01-14 MED ORDER — FENTANYL CITRATE 0.05 MG/ML IJ SOLN
INTRAMUSCULAR | Status: AC | PRN
  Administered 2014-01-14: 100 ug via INTRAVENOUS

## 2014-01-14 MED ORDER — CEFAZOLIN SODIUM-DEXTROSE 2-3 GM-% IV SOLR
2.0000 g | INTRAVENOUS | Status: AC
  Administered 2014-01-14: 2 g via INTRAVENOUS
  Filled 2014-01-14: qty 50

## 2014-01-14 MED ORDER — MIDAZOLAM HCL 2 MG/2ML IJ SOLN
INTRAMUSCULAR | Status: AC | PRN
  Administered 2014-01-14: 2 mg via INTRAVENOUS

## 2014-01-14 MED ORDER — MIDAZOLAM HCL 2 MG/2ML IJ SOLN
INTRAMUSCULAR | Status: AC
  Filled 2014-01-14: qty 2

## 2014-01-14 NOTE — Procedures (Signed)
Interventional Radiology Procedure Note  Procedure: Removal of right chest port Complications: None Recommendations: - Keep bandage dry x 48 hrs  Signed,  Criselda Peaches, MD [facility]

## 2014-01-14 NOTE — H&P (Signed)
Chief Complaint: "I'm here to have my port removed" Referring Physician:Gerhig HPI: Amy Dominguez is an 78 y.o. female with hx of uterine leiomyosarcoma. She has completed treatment and is scheduled for port removal today. PMHx and meds reviewed. She has held her Pradaxa for the past 5 days  Past Medical History:  Past Medical History  Diagnosis Date  . Spinal stenosis     spinal stenosis LS spine  . Hypertension   . Asthma 2008 , 2012    RAD after BRONCHITIS    . Headache(784.0)     allergy related  . Symptomatic bradycardia 2011    Insertion of Medtronic Pacer  . 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 Dysrhythmia     atrial fib-Medtronic Pacemaker-on pradaxa-off for surgery since 9.25 2013  . Arthritis     hands, feet, hips, knees  . Complication of anesthesia     slow to wake up    Past Surgical History:  Past Surgical History  Procedure Laterality Date  . Polypectomy  2008     negative 2011,Dr Sharlett Iles  . Hemorrhoid surgery    . Tonsillectomy    . Tubal ligation    . Insert / replace / remove pacemaker  DR [facility] pacemaker  09/2009  . Foot surgery      JOINT REPLACEMENT LT GREAT TOE   20 YRS AGO   . Cataract extraction      OS  . Lumbar laminectomy/decompression microdiscectomy  09/21/2011    Procedure: LUMBAR LAMINECTOMY/DECOMPRESSION MICRODISCECTOMY;  Surgeon: Eustace Moore, MD;  Location: [facility];  Service: Neurosurgery;  Laterality: N/A;  lumbar laminectomy lumbar four-lumbar five  . Hysteroscopy w/d&c  05/29/2012    Procedure: DILATATION AND CURETTAGE /HYSTEROSCOPY;  Surgeon: Azalia Bilis, MD;  Location: [facility];  Service: Gynecology;  Laterality: N/A;  W/ Resectascope  . Dilation and evacuation  05/29/2012    Procedure: DILATATION AND EVACUATION;  Surgeon: Azalia Bilis, MD;  Location: [facility];  Service: Gynecology;  Laterality: N/A;    Family History:  Family History  Problem Relation Age of Onset  . Stroke Mother 59    post  valve replacement; warfarin missed  . Heart attack Father 53  . Breast cancer Sister   . Deep vein thrombosis Daughter     Factor S non functional  . Diabetes Neg Hx     Social History:  reports that she quit smoking about 7 months ago. Her smoking use included Cigarettes. She has a 13.5 pack-year smoking history. She does not have any smokeless tobacco history on file. She reports that she drinks about 1.8 ounces of alcohol per week. She reports that she does not use illicit drugs.  Allergies:  Allergies  Allergen Reactions  . Tylenol [Acetaminophen] Other (See Comments)    Brand name tylenol causes headache    Medications:   Medication List    ASK your doctor about these medications       dabigatran 150 MG Caps capsule  Commonly known as:  PRADAXA  Take 1 capsule (150 mg total) by mouth 2 (two) times daily.     lisinopril 20 MG tablet  Commonly known as:  PRINIVIL,ZESTRIL  Take 20 mg by mouth at bedtime.     multivitamin with minerals Tabs tablet  Take 1 tablet by mouth daily.     multivitamins ther. w/minerals Tabs tablet  Take 1 tablet by mouth daily.     potassium chloride SA  20 MEQ tablet  Commonly known as:  K-DUR,KLOR-CON  Take 20 mEq by mouth every morning.     torsemide 20 MG tablet  Commonly known as:  DEMADEX  Take 20 mg by mouth every morning.     vitamin E 400 UNIT capsule  Take 400 Units by mouth daily.        Please HPI for pertinent positives, otherwise complete 10 system ROS negative.  Physical Exam: Temp: 98, HR: 87, RR: 18, BP: 122/68    General Appearance:  Alert, cooperative, no distress, appears stated age  Head:  Normocephalic, without obvious abnormality, atraumatic  ENT: Unremarkable  Neck: Supple, symmetrical, trachea midline  Lungs:   Clear to auscultation bilaterally, no w/r/r, respirations unlabored without use of accessory muscles.  Chest Wall:  No tenderness or deformity. Right chest port palpable.  Heart:  Regular rate  and rhythm, S1, S2 normal, no murmur, rub or gallop.  Abdomen:   Soft, non-tender, non distended.  Neurologic: Normal affect, no gross deficits.   No results found for this or any previous visit (from the past 48 hour(s)). No results found.  Assessment/Plan Uterine leiomyosarcoma For Port removal Procedure, risks, complications, use of sedation reviewed Consent signed in chart  Ascencion Dike PA-C 01/14/2014, 1:25 PM

## 2014-01-14 NOTE — Discharge Instructions (Signed)
Moderate Sedation, Adult Moderate sedation is given to help you relax or even sleep through a procedure. You may remain sleepy, be clumsy, or have poor balance for several hours following this procedure. Arrange for a responsible adult, family member, or friend to take you home. A responsible adult should stay with you for at least 24 hours or until the medicines have worn off.  Do not participate in any activities where you could become injured for the next 24 hours, or until you feel normal again. Do not:  Drive.  Swim.  Ride a bicycle.  Operate heavy machinery.  Cook.  Use power tools.  Climb ladders.  Work at [employer].  Do not make important decisions or sign legal documents until you are improved.  Vomiting may occur if you eat too soon. When you can drink without vomiting, try water, juice, or soup. Try solid foods if you feel little or no nausea.  Only take over-the-counter or prescription medications for pain, discomfort, or fever as directed by your caregiver.If pain medications have been prescribed for you, ask your caregiver how soon it is safe to take them.  Make sure you and your family fully understands everything about the medication given to you. Make sure you understand what side effects may occur.  You should not drink alcohol, take sleeping pills, or medications that cause drowsiness for at least 24 hours.  If you smoke, do not smoke alone.  If you are feeling better, you may resume normal activities 24 hours after receiving sedation.  Keep all appointments as scheduled. Follow all instructions.  Ask questions if you do not understand. SEEK MEDICAL CARE IF:   Your skin is pale or bluish in color.  You continue to feel sick to your stomach (nauseous) or throw up (vomit).  Your pain is getting worse and not helped by medication.  You have bleeding or swelling.  You are still sleepy or feeling clumsy after 24 hours. SEEK IMMEDIATE MEDICAL CARE IF:    You develop a rash.  You have difficulty breathing.  You develop any type of allergic problem.  You have a fever. Document Released: 05/10/2001 Document Revised: 11/07/2011 Document Reviewed: 04/22/2013 [facility] Patient Information 2014 [place]. Incision Care  An incision is a surgical cut to open your skin. You need to take care of your incision. This helps you to not get an infection. HOME CARE  Only take medicine as told by your doctor.  Do not take off your bandage (dressing) or get your incision wet until your doctor approves. Change the bandage and call your doctor if the bandage gets wet, dirty, or starts to smell.  Take showers. Do not take baths, swim, or do anything that may soak your incision until it heals.  Return to your normal diet and activities as told or allowed by your doctor.  Avoid lifting any weight until your doctor approves.  Put medicine that helps lessen itching on your incision as told by your doctor. Do not pick or scratch at your incision.  Keep your doctor visit to have your stitches (sutures) or staples removed.  Drink enough fluids to keep your pee (urine) clear or pale yellow. GET HELP RIGHT AWAY IF:  You have a fever.  You have a rash.  You are dizzy, or you pass out (faint) while standing.  You have trouble breathing.  You have a reaction or side effects to medicine given to you.  You have redness, puffiness (swelling), or more pain in  the incision and medicine does not help.  You have fluid, blood, or yellowish-white fluid (pus) coming from the incision lasting over 1 day.  You have muscle aches, chills, or you feel sick.  You have a bad smell coming from the incision or bandage.  Your incision opens up after stitches, staples, or sticky strips have been removed.  You keep feeling sick to your stomach (nauseous) or keep throwing up (vomiting). MAKE SURE YOU:   Understand these instructions.  Will watch your  condition.  Will get help right away if you are not doing well or get worse. Document Released: 11/07/2011 Document Reviewed: 11/07/2011 [facility] Patient Information 2014 [place].

## 2014-02-13 ENCOUNTER — Ambulatory Visit (INDEPENDENT_AMBULATORY_CARE_PROVIDER_SITE_OTHER): Payer: Medicare Other | Admitting: *Deleted

## 2014-02-13 ENCOUNTER — Encounter: Payer: Self-pay | Admitting: Internal Medicine

## 2014-02-13 DIAGNOSIS — Z950 Presence of cardiac pacemaker: Secondary | ICD-10-CM

## 2014-02-13 DIAGNOSIS — I495 Sick sinus syndrome: Secondary | ICD-10-CM

## 2014-02-13 LAB — MDC_IDC_ENUM_SESS_TYPE_REMOTE
Battery Voltage: 2.79 V
Brady Statistic AP VP Percent: 0 %
Brady Statistic AP VS Percent: 70 %
Brady Statistic AS VP Percent: 0 %
Date Time Interrogation Session: 20150618123745
Lead Channel Impedance Value: 413 Ohm
Lead Channel Pacing Threshold Amplitude: 0.375 V
Lead Channel Pacing Threshold Amplitude: 0.5 V
Lead Channel Pacing Threshold Pulse Width: 0.4 ms
Lead Channel Sensing Intrinsic Amplitude: 16 mV
Lead Channel Sensing Intrinsic Amplitude: 2.8 mV
Lead Channel Setting Pacing Amplitude: 2.5 V
Lead Channel Setting Pacing Pulse Width: 0.4 ms
MDC IDC MSMT BATTERY IMPEDANCE: 164 Ohm
MDC IDC MSMT BATTERY REMAINING LONGEVITY: 124 mo
MDC IDC MSMT LEADCHNL RV IMPEDANCE VALUE: 615 Ohm
MDC IDC MSMT LEADCHNL RV PACING THRESHOLD PULSEWIDTH: 0.4 ms
MDC IDC SET LEADCHNL RA PACING AMPLITUDE: 2 V
MDC IDC SET LEADCHNL RV SENSING SENSITIVITY: 5.6 mV
MDC IDC STAT BRADY AS VS PERCENT: 29 %

## 2014-02-13 NOTE — Progress Notes (Signed)
Remote pacemaker transmission.   

## 2014-02-20 IMAGING — US IR FLUORO GUIDE CV LINE*R*
1 series · 1 of 1 positions shown · non-contrast
Comparison: none

TUNNELED CENTRAL VENOUS CATHETER WITH SUBCUTANEOUS RESERVOIR
(PORTACATH) PLACEMENT WITH ULTRASOUND AND FLUOROSCOPIC  GUIDANCE

Date: 08/01/2012
CLINICAL HISTORY: 76-year-old female with a history of uterine
cancer.  She requires durable central venous access for
chemotherapy.

[Series 1: sp fluoro guide cv line*left* · 1 of 1 slices shown]
[im 1/1]
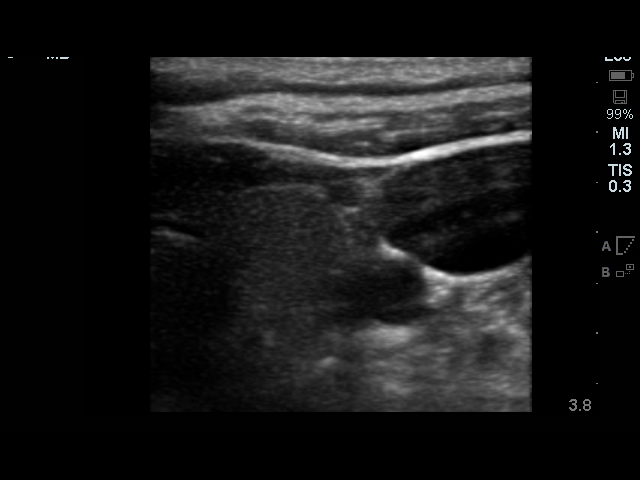

[1 of 1 positions shown; findings below may reference images not displayed]

Sedation: Moderate (conscious) sedation was administered during
this procedure.  A total of 3mg Versed and 200mg Fentanyl were
administered intravenously.  The patient's vital signs were
monitored continuously by radiology nursing throughout the course
of the procedure.

Total sedation time: 30 minutes

Fluoroscopy Time: 0.4 minutes.

Procedure:

The right neck and chest was prepped with chlorhexidine, and draped
in the usual sterile fashion using maximum barrier technique (cap
and mask, sterile gown, sterile gloves, large sterile sheet, hand
hygiene and cutaneous antiseptic).  Antibiotic prophylaxis was
provided with twog Ancef administered IV one hour prior to skin
incision.  Local anesthesia was attained by infiltration with 1%
lidocaine with epinephrine.

Ultrasound demonstrated patency of the right internal jugular vein,
and this was documented with an image.  Under real-time ultrasound
guidance, this vein was accessed with a 21 gauge micropuncture
needle and image documentation was performed.  A small dermatotomy
was made at the access site with an 11 scalpel.  A 0.018" wire was
advanced into the SVC and the access needle exchanged for a 4F
micropuncture vascular sheath.  The 0.018" wire was then removed
and a 0.035" wire advanced into the IVC.

An appropriate location for the subcutaneous reservoir was selected
below the clavicle and an incision was made through the skin and
underlying soft tissues.  The subcutaneous tissues were then
dissected using a combination of blunt and sharp surgical technique
and a pocket was formed.  A single lumen power injectable
portacatheter was then tunneled through the subcutaneous tissues
from the pocket to the dermatotomy and the port reservoir placed
within the subcutaneous pocket.  Given the overall looseness of the
subcutaneous adipose tissue, the port was secured in place with two
2-0 nylon sutures.

The venous access site was then serially dilated and a peel away
vascular sheath placed over the wire.  The wire was removed and the
port catheter advanced into position under fluoroscopic guidance.
The catheter tip is positioned in the superior cavoatrial junction.
This was documented with a spot image. The portacatheter was then
tested and found to flush and aspirate well.  The port was flushed
with saline followed by 100 units/mL heparinized saline.

The pocket was then closed in two layers using first subdermal
inverted interrupted absorbable sutures followed by a running
subcuticular suture.  The epidermis was then sealed with Dermabond.
The dermatotomy at the venous access site was also closed with a
single inverted subdermal suture and the epidermis sealed with
Dermabond.

Complications:  None.  The patient tolerated the procedure well.
IMPRESSION: Successful placement of a right IJapproach PowerPort with
ultrasound and fluoroscopic guidance.  The catheter is ready for
use.

[REDACTED]

## 2014-02-25 ENCOUNTER — Encounter: Payer: Self-pay | Admitting: Cardiology

## 2014-04-30 ENCOUNTER — Telehealth: Payer: Self-pay

## 2014-04-30 DIAGNOSIS — C55 Malignant neoplasm of uterus, part unspecified: Secondary | ICD-10-CM

## 2014-04-30 NOTE — Telephone Encounter (Signed)
Told Mr. Amy Dominguez that Ms. Amy Dominguez needs to call the [facility] schedulers and make an appointment with Dr. Marko Plume for the end of September beginning of October  For lab and visit. They will be returning to [place] from [place]. After Labor Day. Patient does not have her [place] cath anymore per her husband.

## 2014-04-30 NOTE — Telephone Encounter (Signed)
Sent POF to scheduling.

## 2014-05-02 ENCOUNTER — Telehealth: Payer: Self-pay | Admitting: Oncology

## 2014-05-02 NOTE — Telephone Encounter (Signed)
no answer....mailed pt appt sched and letter °

## 2014-05-06 ENCOUNTER — Telehealth: Payer: Self-pay | Admitting: *Deleted

## 2014-05-06 NOTE — Telephone Encounter (Signed)
Patient called and states she is having a knee replacement 05/19/14 and would like to have appt with Dr. Marko Plume prior to that. Per Dr. Marko Plume, patient can come in and have labs in September and then see her in October once she has recovered some. Called patient and left VM with the above information and told her to call us back if that works for her.

## 2014-05-06 NOTE — Telephone Encounter (Signed)
Patient called back regarding schedule. Agreeable to come on 06/04/14 for labs and Dr. Marko Plume at 9:30am. Pt states she will be moving to [place] in mid November - asked if Dr. Marko Plume can recommend an oncologist there. The closest medical center per pt is [facility] and pt prefers female but is okay with female. Told patient I would pass information along to Dr. Marko Plume.

## 2014-05-08 ENCOUNTER — Other Ambulatory Visit: Payer: Self-pay | Admitting: Orthopedic Surgery

## 2014-05-08 NOTE — Progress Notes (Signed)
Preoperative surgical orders have been place into the Epic hospital system for Amy Dominguez on 05/08/2014, 5:23 PM  by Mickel Crow for surgery on 05/19/2014.  Preop Total Knee orders including Experal, IV Tylenol, and IV Decadron as long as there are no contraindications to the above medications. Arlee Muslim, PA-C

## 2014-05-09 ENCOUNTER — Encounter (HOSPITAL_COMMUNITY): Payer: Self-pay | Admitting: Pharmacy Technician

## 2014-05-12 ENCOUNTER — Encounter (HOSPITAL_COMMUNITY): Payer: Self-pay

## 2014-05-13 ENCOUNTER — Other Ambulatory Visit (HOSPITAL_COMMUNITY): Payer: Self-pay | Admitting: *Deleted

## 2014-05-14 ENCOUNTER — Encounter: Payer: Self-pay | Admitting: Gynecologic Oncology

## 2014-05-14 ENCOUNTER — Encounter (HOSPITAL_COMMUNITY): Payer: Self-pay

## 2014-05-14 ENCOUNTER — Encounter (HOSPITAL_COMMUNITY)
Admission: RE | Admit: 2014-05-14 | Discharge: 2014-05-14 | Disposition: A | Discharge status: Home / Self Care | Payer: Medicare Other | Source: Ambulatory Visit | Attending: Orthopedic Surgery | Admitting: Orthopedic Surgery

## 2014-05-14 DIAGNOSIS — I4891 Unspecified atrial fibrillation: Secondary | ICD-10-CM | POA: Diagnosis not present

## 2014-05-14 DIAGNOSIS — I10 Essential (primary) hypertension: Secondary | ICD-10-CM | POA: Insufficient documentation

## 2014-05-14 DIAGNOSIS — R51 Headache: Secondary | ICD-10-CM | POA: Insufficient documentation

## 2014-05-14 DIAGNOSIS — J45909 Unspecified asthma, uncomplicated: Secondary | ICD-10-CM | POA: Diagnosis not present

## 2014-05-14 DIAGNOSIS — M1710 Unilateral primary osteoarthritis, unspecified knee: Secondary | ICD-10-CM | POA: Diagnosis not present

## 2014-05-14 DIAGNOSIS — Z950 Presence of cardiac pacemaker: Secondary | ICD-10-CM | POA: Insufficient documentation

## 2014-05-14 DIAGNOSIS — Z01812 Encounter for preprocedural laboratory examination: Secondary | ICD-10-CM | POA: Insufficient documentation

## 2014-05-14 HISTORY — DX: Presence of cardiac pacemaker: Z95.0

## 2014-05-14 LAB — COMPREHENSIVE METABOLIC PANEL
ALT: 6 U/L (ref 0–35)
AST: 18 U/L (ref 0–37)
Albumin: 4.1 g/dL (ref 3.5–5.2)
Alkaline Phosphatase: 82 U/L (ref 39–117)
Anion gap: 11 (ref 5–15)
BUN: 19 mg/dL (ref 6–23)
CALCIUM: 9.9 mg/dL (ref 8.4–10.5)
CO2: 27 meq/L (ref 19–32)
CREATININE: 0.84 mg/dL (ref 0.50–1.10)
Chloride: 101 mEq/L (ref 96–112)
GFR calc Af Amer: 76 mL/min — ABNORMAL LOW (ref >90)
GFR, EST NON AFRICAN AMERICAN: 66 mL/min — AB (ref >90)
Glucose, Bld: 95 mg/dL (ref 70–99)
Potassium: 4.3 mEq/L (ref 3.7–5.3)
SODIUM: 139 meq/L (ref 137–147)
Total Bilirubin: 0.5 mg/dL (ref 0.3–1.2)
Total Protein: 7.2 g/dL (ref 6.0–8.3)

## 2014-05-14 LAB — CBC
HCT: 39.8 % (ref 36.0–46.0)
Hemoglobin: 12.7 g/dL (ref 12.0–15.0)
MCH: 29.3 pg (ref 26.0–34.0)
MCHC: 31.9 g/dL (ref 30.0–36.0)
MCV: 91.9 fL (ref 78.0–100.0)
Platelets: 186 10*3/uL (ref 150–400)
RBC: 4.33 MIL/uL (ref 3.87–5.11)
RDW: 13.3 % (ref 11.5–15.5)
WBC: 8.9 10*3/uL (ref 4.0–10.5)

## 2014-05-14 LAB — URINALYSIS, ROUTINE W REFLEX MICROSCOPIC
BILIRUBIN URINE: NEGATIVE
Glucose, UA: NEGATIVE mg/dL
Hgb urine dipstick: NEGATIVE
KETONES UR: NEGATIVE mg/dL
LEUKOCYTES UA: NEGATIVE
NITRITE: NEGATIVE
PH: 5.5 (ref 5.0–8.0)
Protein, ur: NEGATIVE mg/dL
SPECIFIC GRAVITY, URINE: 1.01 (ref 1.005–1.030)
UROBILINOGEN UA: 0.2 mg/dL (ref 0.0–1.0)

## 2014-05-14 LAB — PROTIME-INR
INR: 1.01 (ref 0.00–1.49)
PROTHROMBIN TIME: 13.3 s (ref 11.6–15.2)

## 2014-05-14 LAB — APTT: aPTT: 29 seconds (ref 24–37)

## 2014-05-14 LAB — SURGICAL PCR SCREEN
MRSA, PCR: NEGATIVE
STAPHYLOCOCCUS AUREUS: NEGATIVE

## 2014-05-14 NOTE — Patient Instructions (Addendum)
20     Your procedure is scheduled on:  Monday 05/19/2014  Report to [facility] Main Entrance and follow signs to Short Stay  at  [place]  AM.  Call this number if you have problems the night before or morning of surgery:  [phone]   Remember:          Do not eat food or drink liquids AFTER MIDNIGHT!  Take these medicines the morning of surgery with A SIP OF WATER: NONE    [place] IS NOT RESPONSIBLE FOR ANY BELONGINGS OR VALUABLES BROUGHT TO HOSPITAL.  Marland Kitchen  Leave suitcase in the car. After surgery it may be brought to your room.  For patients admitted to the hospital, checkout time is 11:00 AM the day of              Discharge.    DO NOT WEAR JEWELRY,MAKE-UP,LOTIONS,POWDERS,PERFUMES,CONTACTS , DENTURES OR BRIDGEWORK ,AND DO NOT WEAR FALSE EYELASHES                                    Patients discharged the day of surgery will not be allowed to drive home.   If going home the same day of surgery, must have someone stay with you first 24 hrs.at home and arrange for someone to drive you home from the [place] IS:N/A   Special Instructions:              Please read over the following fact sheets that you were given:             1. [place] - Preparing for Surgery Before surgery, you can play an important role.  Because skin is not sterile, your skin needs to be as free of germs as possible.  You can reduce the number of germs on your skin by washing with CHG (chlorahexidine gluconate) soap before surgery.  CHG is an antiseptic cleaner which kills germs and bonds with the skin to continue killing germs even after washing. Please DO NOT use if you have an allergy to CHG or antibacterial soaps.  If your skin becomes reddened/irritated stop using the CHG and inform your nurse when you arrive at Short Stay. Do  not shave (including legs and underarms) for at least 48 hours prior to the first CHG shower.  You may shave your face/neck. Please follow these instructions carefully:  1.  Shower with CHG Soap the night before surgery and the  morning of Surgery.  2.  If you choose to wash your hair, wash your hair first as usual with your  normal  shampoo.  3.  After you shampoo, rinse your hair and body thoroughly to remove the  shampoo.  4.  Use CHG as you would any other liquid soap.  You can apply chg directly  to the skin and wash                       Gently with a scrungie or clean washcloth.  5.  Apply the CHG Soap to your body ONLY FROM THE NECK DOWN.   Do not use on face/ open                           Wound or open sores. Avoid contact with eyes, ears mouth and genitals (private parts).                       Wash face,  Genitals (private parts) with your normal soap.             6.  Wash thoroughly, paying special attention to the area where your surgery  will be performed.  7.  Thoroughly rinse your body with warm water from the neck down.  8.  DO NOT shower/wash with your normal soap after using and rinsing off  the CHG Soap.                9.  Pat yourself dry with a clean towel.            10.  Wear clean pajamas.            11.  Place clean sheets on your bed the night of your first shower and do not  sleep with pets. Day of Surgery : Do not apply any lotions/deodorants the morning of surgery.  Please wear clean clothes to the hospital/surgery center.  FAILURE TO FOLLOW THESE INSTRUCTIONS MAY RESULT IN THE CANCELLATION OF YOUR SURGERY PATIENT SIGNATURE_________________________________  NURSE SIGNATURE__________________________________  ________________________________________________________________________   Adam Phenix  An incentive spirometer is a tool that can help keep your lungs clear and active. This tool measures how well you are filling your  lungs with each breath. Taking long deep breaths may help reverse or decrease the chance of developing breathing (pulmonary) problems (especially infection) following:  A long period of time when you are unable to move or be active. BEFORE THE PROCEDURE   If the spirometer includes an indicator to show your best effort, your nurse or respiratory therapist will set it to a desired goal.  If possible, sit up straight or lean slightly forward. Try not to slouch.  Hold the incentive spirometer in an upright position. INSTRUCTIONS FOR USE  1. Sit on the edge of your bed if possible, or sit up as far as you can in bed or on a chair. 2. Hold the incentive spirometer in an upright position. 3. Breathe out normally. 4. Place the mouthpiece in your mouth and seal your lips tightly around it. 5. Breathe in slowly and as deeply as possible, raising the piston or the ball toward the top of the column. 6. Hold your breath for 3-5 seconds or for as long as possible. Allow the piston or ball to fall to the bottom of the column. 7. Remove the mouthpiece from your mouth and breathe out normally. 8. Rest for a few seconds and repeat Steps 1 through 7 at least 10 times every 1-2 hours when you are awake. Take your time and take a few normal breaths between deep breaths. 9. The spirometer may include an indicator to show  your best effort. Use the indicator as a goal to work toward during each repetition. 10. After each set of 10 deep breaths, practice coughing to be sure your lungs are clear. If you have an incision (the cut made at the time of surgery), support your incision when coughing by placing a pillow or rolled up towels firmly against it. Once you are able to get out of bed, walk around indoors and cough well. You may stop using the incentive spirometer when instructed by your caregiver.  RISKS AND COMPLICATIONS  Take your time so you do not get dizzy or light-headed.  If you are in pain, you may need  to take or ask for pain medication before doing incentive spirometry. It is harder to take a deep breath if you are having pain. AFTER USE  Rest and breathe slowly and easily.  It can be helpful to keep track of a log of your progress. Your caregiver can provide you with a simple table to help with this. If you are using the spirometer at home, follow these instructions: [place] IF:   You are having difficultly using the spirometer.  You have trouble using the spirometer as often as instructed.  Your pain medication is not giving enough relief while using the spirometer.  You develop fever of 100.5 F (38.1 C) or higher. SEEK IMMEDIATE MEDICAL CARE IF:   You cough up bloody sputum that had not been present before.  You develop fever of 102 F (38.9 C) or greater.  You develop worsening pain at or near the incision site. MAKE SURE YOU:   Understand these instructions.  Will watch your condition.  Will get help right away if you are not doing well or get worse. Document Released: 12/26/2006 Document Revised: 11/07/2011 Document Reviewed: 02/26/2007 ExitCare Patient Information 2014 ExitCare, [place].   ________________________________________________________________________  WHAT IS A BLOOD TRANSFUSION? Blood Transfusion Information  A transfusion is the replacement of blood or some of its parts. Blood is made up of multiple cells which provide different functions.  Red blood cells carry oxygen and are used for blood loss replacement.  White blood cells fight against infection.  Platelets control bleeding.  Plasma helps clot blood.  Other blood products are available for specialized needs, such as hemophilia or other clotting disorders. BEFORE THE TRANSFUSION  Who gives blood for transfusions?   Healthy volunteers who are fully evaluated to make sure their blood is safe. This is blood bank blood. Transfusion therapy is the safest it has ever been in the  practice of medicine. Before blood is taken from a donor, a complete history is taken to make sure that person has no history of diseases nor engages in risky social behavior (examples are intravenous drug use or sexual activity with multiple partners). The donor's travel history is screened to minimize risk of transmitting infections, such as malaria. The donated blood is tested for signs of infectious diseases, such as HIV and hepatitis. The blood is then tested to be sure it is compatible with you in order to minimize the chance of a transfusion reaction. If you or a relative donates blood, this is often done in anticipation of surgery and is not appropriate for emergency situations. It takes many days to process the donated blood. RISKS AND COMPLICATIONS Although transfusion therapy is very safe and saves many lives, the main dangers of transfusion include:   Getting an infectious disease.  Developing a transfusion reaction. This is an allergic reaction to  something in the blood you were given. Every precaution is taken to prevent this. The decision to have a blood transfusion has been considered carefully by your caregiver before blood is given. Blood is not given unless the benefits outweigh the risks. AFTER THE TRANSFUSION  Right after receiving a blood transfusion, you will usually feel much better and more energetic. This is especially true if your red blood cells have gotten low (anemic). The transfusion raises the level of the red blood cells which carry oxygen, and this usually causes an energy increase.  The nurse administering the transfusion will monitor you carefully for complications. HOME CARE INSTRUCTIONS  No special instructions are needed after a transfusion. You may find your energy is better. Speak with your caregiver about any limitations on activity for underlying diseases you may have. SEEK MEDICAL CARE IF:   Your condition is not improving after your transfusion.  You  develop redness or irritation at the intravenous (IV) site. SEEK IMMEDIATE MEDICAL CARE IF:  Any of the following symptoms occur over the next 12 hours:  Shaking chills.  You have a temperature by mouth above 102 F (38.9 C), not controlled by medicine.  Chest, back, or muscle pain.  People around you feel you are not acting correctly or are confused.  Shortness of breath or difficulty breathing.  Dizziness and fainting.  You get a rash or develop hives.  You have a decrease in urine output.  Your urine turns a dark color or changes to pink, red, or brown. Any of the following symptoms occur over the next 10 days:  You have a temperature by mouth above 102 F (38.9 C), not controlled by medicine.  Shortness of breath.  Weakness after normal activity.  The white part of the eye turns yellow (jaundice).  You have a decrease in the amount of urine or are urinating less often.  Your urine turns a dark color or changes to pink, red, or brown. Document Released: 08/12/2000 Document Revised: 11/07/2011 Document Reviewed: 03/31/2008 ExitCare Patient Information 2014 ExitCare, [place].  _______________________________________________________________________ WHAT IS A BLOOD TRANSFUSION? Blood Transfusion Information  A transfusion is the replacement of blood or some of its parts. Blood is made up of multiple cells which provide different functions.  Red blood cells carry oxygen and are used for blood loss replacement.  White blood cells fight against infection.  Platelets control bleeding.  Plasma helps clot blood.  Other blood products are available for specialized needs, such as hemophilia or other clotting disorders. BEFORE THE TRANSFUSION  Who gives blood for transfusions?   Healthy volunteers who are fully evaluated to make sure their blood is safe. This is blood bank blood. Transfusion therapy is the safest it has ever been in the practice of medicine. Before blood  is taken from a donor, a complete history is taken to make sure that person has no history of diseases nor engages in risky social behavior (examples are intravenous drug use or sexual activity with multiple partners). The donor's travel history is screened to minimize risk of transmitting infections, such as malaria. The donated blood is tested for signs of infectious diseases, such as HIV and hepatitis. The blood is then tested to be sure it is compatible with you in order to minimize the chance of a transfusion reaction. If you or a relative donates blood, this is often done in anticipation of surgery and is not appropriate for emergency situations. It takes many days to process the donated blood. RISKS AND  COMPLICATIONS Although transfusion therapy is very safe and saves many lives, the main dangers of transfusion include:   Getting an infectious disease.  Developing a transfusion reaction. This is an allergic reaction to something in the blood you were given. Every precaution is taken to prevent this. The decision to have a blood transfusion has been considered carefully by your caregiver before blood is given. Blood is not given unless the benefits outweigh the risks. AFTER THE TRANSFUSION  Right after receiving a blood transfusion, you will usually feel much better and more energetic. This is especially true if your red blood cells have gotten low (anemic). The transfusion raises the level of the red blood cells which carry oxygen, and this usually causes an energy increase.  The nurse administering the transfusion will monitor you carefully for complications. HOME CARE INSTRUCTIONS  No special instructions are needed after a transfusion. You may find your energy is better. Speak with your caregiver about any limitations on activity for underlying diseases you may have. SEEK MEDICAL CARE IF:   Your condition is not improving after your transfusion.  You develop redness or irritation at the  intravenous (IV) site. SEEK IMMEDIATE MEDICAL CARE IF:  Any of the following symptoms occur over the next 12 hours:  Shaking chills.  You have a temperature by mouth above 102 F (38.9 C), not controlled by medicine.  Chest, back, or muscle pain.  People around you feel you are not acting correctly or are confused.  Shortness of breath or difficulty breathing.  Dizziness and fainting.  You get a rash or develop hives.  You have a decrease in urine output.  Your urine turns a dark color or changes to pink, red, or brown. Any of the following symptoms occur over the next 10 days:  You have a temperature by mouth above 102 F (38.9 C), not controlled by medicine.  Shortness of breath.  Weakness after normal activity.  The white part of the eye turns yellow (jaundice).  You have a decrease in the amount of urine or are urinating less often.  Your urine turns a dark color or changes to pink, red, or brown. Document Released: 08/12/2000 Document Revised: 11/07/2011 Document Reviewed: 03/31/2008 [facility] Patient Information 2014 [place], [place].  _______________________________________________________________________

## 2014-05-15 ENCOUNTER — Other Ambulatory Visit: Payer: Self-pay | Admitting: Orthopedic Surgery

## 2014-05-15 NOTE — H&P (Signed)
Amy Dominguez DOB: 1935-09-21 Married / Language: English / Race: White Female Date of Admission:  05/19/2014 Chief Complaint:  Right Knee Pain History of Present Illness  The patient is a 78 year old female who comes in for a preoperative history and physical. The patient is scheduled for a right total knee arthroplasty to be performed by Dr. Dione Plover. Aluisio, MD at [facility] on 05/19/2014. The patient is a 78 year old female who presents for follow up of their knee. The patient is being followed for their right knee pain and osteoarthritis. Symptoms reported today include: pain. The patient feels that they are doing poorly. The following medication has been used for pain control: Ultram. The patient presents today following cortisone injection 09/24/2013 (Patient states that the cortisone injection really helped her last time. The injections have helped in the past She started to hurt again following the injections and wants to get a more permanant fix. She is now ready to proceed with knee replacement. They have been treated conservatively in the past for the above stated problem and despite conservative measures, they continue to have progressive pain and severe functional limitations and dysfunction. They have failed non-operative management including home exercise, medications, and injections. It is felt that they would benefit from undergoing total joint replacement. Risks and benefits of the procedure have been discussed with the patient and they elect to proceed with surgery. There are no active contraindications to surgery such as ongoing infection or rapidly progressive neurological disease.  Allergies TYLENOL - Brand name only - the binding agent - severe migraines. Able to take generic acetaminophens.  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 mother  Social History Pain Contract no Number of flights of stairs before winded 2-3 Current work status retired Tobacco use Never smoker. current some days smoker; smoke(d) less than 1/2 pack(s) per day Marital status married Living situation live with spouse Illicit drug use no Drug/Alcohol Rehab (Currently) no Drug/Alcohol Rehab (Previously) no Children 4 Alcohol use Occasional alcohol use. current drinker; drinks wine; less than 5 per week Exercise Exercises weekly; does gym / weights Post-Surgical Plans Home Advance Directives Living Will, Healthcare POA  Medication History  Claritin (Oral) Specific dose unknown - Active. Multi Vitamin Daily (Oral) Active. Pradaxa (150MG  Capsule, Oral) Active. Torsemide (20MG  Tablet, Oral) Active. Klor-Con M20 [facility] Tablet ER, Oral) Active. Lisinopril (20MG  Tablet, Oral) Active. Vitamin E Active. Fish Oil Active.  Past Surgical History Spinal Surgery Tonsillectomy Adnoids Tubal Ligation Colon Polyp Removal - Colonoscopy Dilation and Curettage of Uterus Hysterectomy Date: 2013. Great Toe Joint Replacement, Left Date: 69.   Review of Systems  General Not Present- Chills, Fatigue, Fever, Memory Loss, Night Sweats, Weight Gain and Weight Loss. Skin Not Present- Eczema, Hives, Itching, Lesions and Rash. HEENT Not Present- Dentures, Double Vision, Headache, Hearing Loss, Tinnitus and Visual Loss. Respiratory Not Present- Allergies, Chronic Cough, Coughing up blood, Shortness of breath at rest and Shortness of breath with exertion. Cardiovascular Not Present- Chest Pain, Difficulty Breathing Lying Down, Murmur, Palpitations, Racing/skipping heartbeats  and Swelling. Gastrointestinal Not Present- Abdominal Pain, Bloody Stool, Constipation, Diarrhea, Difficulty Swallowing, Heartburn, Jaundice, Loss of appetitie, Nausea and Vomiting. Female Genitourinary Not Present- Blood  in Urine, Discharge, Flank Pain, Incontinence, Painful Urination, Urgency, Urinary frequency, Urinary Retention, Urinating at Night and Weak urinary stream. Musculoskeletal Present- Back Pain, Joint Pain and Morning Stiffness. Not Present- Joint Swelling, Muscle Pain, Muscle Weakness and Spasms. Neurological Not Present- Blackout spells, Difficulty with balance, Dizziness, Paralysis, Tremor and Weakness. Psychiatric Not Present- Insomnia.   Vitals  Pulse: 64 (Regular)  Resp.: 14 (Unlabored)  BP: 114/64 (Sitting, Right Arm, Standard)  Physical Exam The physical exam findings are as follows: Note:78 year old female with continued knee pain. Patient is accompanied today by her husband [name].  General Mental Status -Alert, cooperative and good historian. General Appearance-pleasant, Not in acute distress. Orientation-Oriented X3. Build & Nutrition-Well nourished and Well developed.  Head and Neck Head-normocephalic, atraumatic . Neck Global Assessment - supple, no bruit auscultated on the right, no bruit auscultated on the left.  Eye Vision-Wears contact lenses. Pupil - Bilateral-Regular and Round. Motion - Bilateral-EOMI.  Chest and Lung Exam Auscultation Breath sounds - clear at anterior chest wall and clear at posterior chest wall. Adventitious sounds - No Adventitious sounds.  Cardiovascular Auscultation Rhythm - Regular rate and rhythm. Heart Sounds - S1 WNL and S2 WNL. Murmurs & Other Heart Sounds: Murmur 1 - Location - Aortic Area, Pulmonic Area and Sternal Border - Left. Timing - Mid-systolic. Grade - III/VI. Character - Crescendo/Decrescendo.  Abdomen Palpation/Percussion Tenderness - Abdomen is non-tender to palpation. Rigidity  (guarding) - Abdomen is soft. Auscultation Auscultation of the abdomen reveals - Bowel sounds normal.  Female Genitourinary Note: Not done, not pertinent to present illness   Musculoskeletal Note: Well developed female in no distress. Examination of the right knee shows no effusion. Her range of motion in the knee is about 5 to 125. There is marked crepitus on range of motion. She is tender along the medial joint line. There is no lateral tenderness or any instability noted about the knee.  Assessment & Plan Osteoarthritis of right knee (715.96  M17.9) Note:Plan is for a Right Total Knee Replacement by Dr. Wynelle Link.  Plan is to go home with husband.  PCP - Dr. Linna Darner  The patient will receive topical TXA (tranexamic acid) due to: Malignant Fibroid Tumor  Signed electronically by Joelene Millin, III PA-C

## 2014-05-16 ENCOUNTER — Ambulatory Visit (INDEPENDENT_AMBULATORY_CARE_PROVIDER_SITE_OTHER): Payer: Medicare Other | Admitting: *Deleted

## 2014-05-16 ENCOUNTER — Encounter: Payer: Self-pay | Admitting: Internal Medicine

## 2014-05-16 DIAGNOSIS — I495 Sick sinus syndrome: Secondary | ICD-10-CM

## 2014-05-16 LAB — MDC_IDC_ENUM_SESS_TYPE_REMOTE
Battery Remaining Longevity: 115 mo
Brady Statistic AP VS Percent: 73 %
Brady Statistic AS VP Percent: 0 %
Brady Statistic AS VS Percent: 26 %
Date Time Interrogation Session: 20150918121015
Lead Channel Impedance Value: 630 Ohm
Lead Channel Pacing Threshold Amplitude: 0.375 V
Lead Channel Pacing Threshold Amplitude: 0.5 V
Lead Channel Pacing Threshold Pulse Width: 0.4 ms
Lead Channel Sensing Intrinsic Amplitude: 16 mV
Lead Channel Setting Pacing Amplitude: 2.5 V
Lead Channel Setting Pacing Pulse Width: 0.4 ms
Lead Channel Setting Sensing Sensitivity: 5.6 mV
MDC IDC MSMT BATTERY IMPEDANCE: 213 Ohm
MDC IDC MSMT BATTERY VOLTAGE: 2.8 V
MDC IDC MSMT LEADCHNL RA IMPEDANCE VALUE: 419 Ohm
MDC IDC MSMT LEADCHNL RA SENSING INTR AMPL: 1.4 mV
MDC IDC MSMT LEADCHNL RV PACING THRESHOLD PULSEWIDTH: 0.4 ms
MDC IDC SET LEADCHNL RA PACING AMPLITUDE: 2 V
MDC IDC STAT BRADY AP VP PERCENT: 0 %

## 2014-05-16 NOTE — Progress Notes (Signed)
Remote pacemaker transmission.   

## 2014-05-19 ENCOUNTER — Inpatient Hospital Stay (HOSPITAL_COMMUNITY)
Admission: RE | Admit: 2014-05-19 | Discharge: 2014-05-21 | DRG: 470 | Disposition: A | Discharge status: Home Health | Payer: Medicare Other | Source: Ambulatory Visit | Attending: Orthopedic Surgery | Admitting: Orthopedic Surgery

## 2014-05-19 ENCOUNTER — Encounter (HOSPITAL_COMMUNITY)
Admission: RE | Disposition: A | Discharge status: Home Health | Payer: Self-pay | Source: Ambulatory Visit | Attending: Orthopedic Surgery

## 2014-05-19 ENCOUNTER — Encounter (HOSPITAL_COMMUNITY): Payer: Self-pay | Admitting: *Deleted

## 2014-05-19 ENCOUNTER — Inpatient Hospital Stay (HOSPITAL_COMMUNITY): Payer: Medicare Other | Admitting: Anesthesiology

## 2014-05-19 ENCOUNTER — Encounter (HOSPITAL_COMMUNITY): Payer: Medicare Other | Admitting: Anesthesiology

## 2014-05-19 DIAGNOSIS — M1710 Unilateral primary osteoarthritis, unspecified knee: Secondary | ICD-10-CM | POA: Diagnosis present

## 2014-05-19 DIAGNOSIS — Z79899 Other long term (current) drug therapy: Secondary | ICD-10-CM

## 2014-05-19 DIAGNOSIS — Z860100 Personal history of colon polyps, unspecified: Secondary | ICD-10-CM

## 2014-05-19 DIAGNOSIS — IMO0002 Reserved for concepts with insufficient information to code with codable children: Secondary | ICD-10-CM | POA: Diagnosis present

## 2014-05-19 DIAGNOSIS — Z8249 Family history of ischemic heart disease and other diseases of the circulatory system: Secondary | ICD-10-CM

## 2014-05-19 DIAGNOSIS — Z888 Allergy status to other drugs, medicaments and biological substances status: Secondary | ICD-10-CM

## 2014-05-19 DIAGNOSIS — J45909 Unspecified asthma, uncomplicated: Secondary | ICD-10-CM | POA: Diagnosis present

## 2014-05-19 DIAGNOSIS — Z01812 Encounter for preprocedural laboratory examination: Secondary | ICD-10-CM

## 2014-05-19 DIAGNOSIS — Z8542 Personal history of malignant neoplasm of other parts of uterus: Secondary | ICD-10-CM | POA: Diagnosis not present

## 2014-05-19 DIAGNOSIS — M1711 Unilateral primary osteoarthritis, right knee: Secondary | ICD-10-CM

## 2014-05-19 DIAGNOSIS — I10 Essential (primary) hypertension: Secondary | ICD-10-CM | POA: Diagnosis present

## 2014-05-19 DIAGNOSIS — M76899 Other specified enthesopathies of unspecified lower limb, excluding foot: Secondary | ICD-10-CM | POA: Diagnosis present

## 2014-05-19 DIAGNOSIS — I4891 Unspecified atrial fibrillation: Secondary | ICD-10-CM | POA: Diagnosis present

## 2014-05-19 DIAGNOSIS — C569 Malignant neoplasm of unspecified ovary: Secondary | ICD-10-CM | POA: Diagnosis present

## 2014-05-19 DIAGNOSIS — M25569 Pain in unspecified knee: Secondary | ICD-10-CM | POA: Diagnosis present

## 2014-05-19 DIAGNOSIS — M898X9 Other specified disorders of bone, unspecified site: Secondary | ICD-10-CM | POA: Diagnosis present

## 2014-05-19 DIAGNOSIS — M179 Osteoarthritis of knee, unspecified: Secondary | ICD-10-CM | POA: Diagnosis present

## 2014-05-19 HISTORY — PX: TOTAL KNEE ARTHROPLASTY: SHX125

## 2014-05-19 LAB — TYPE AND SCREEN
ABO/RH(D): O POS
Antibody Screen: POSITIVE
DAT, IGG: NEGATIVE
PT AG TYPE: NEGATIVE

## 2014-05-19 SURGERY — ARTHROPLASTY, KNEE, TOTAL
Anesthesia: Spinal | Site: Knee | Laterality: Right

## 2014-05-19 MED ORDER — MEPERIDINE HCL 50 MG/ML IJ SOLN: 6.2500 mg | INTRAMUSCULAR | Status: DC | PRN

## 2014-05-19 MED ORDER — ONDANSETRON HCL 4 MG/2ML IJ SOLN
INTRAMUSCULAR | Status: DC | PRN
  Administered 2014-05-19: 4 mg via INTRAVENOUS

## 2014-05-19 MED ORDER — BUPIVACAINE IN DEXTROSE 0.75-8.25 % IT SOLN
INTRATHECAL | Status: DC | PRN
  Administered 2014-05-19: 1.4 mL via INTRATHECAL

## 2014-05-19 MED ORDER — SODIUM CHLORIDE 0.9 % IJ SOLN
INTRAMUSCULAR | Status: AC
Start: 2014-05-19 — End: 2014-05-19
  Filled 2014-05-19: qty 50

## 2014-05-19 MED ORDER — DIPHENHYDRAMINE HCL 12.5 MG/5ML PO ELIX
12.5000 mg | ORAL_SOLUTION | ORAL | Status: DC | PRN
  Administered 2014-05-20: 25 mg via ORAL
  Filled 2014-05-19: qty 10

## 2014-05-19 MED ORDER — ACETAMINOPHEN 10 MG/ML IV SOLN
1000.0000 mg | Freq: Once | INTRAVENOUS | Status: DC
  Filled 2014-05-19: qty 100

## 2014-05-19 MED ORDER — PROMETHAZINE HCL 25 MG/ML IJ SOLN: 6.2500 mg | INTRAMUSCULAR | Status: DC | PRN

## 2014-05-19 MED ORDER — SODIUM CHLORIDE 0.9 % IV SOLN: INTRAVENOUS | Status: DC

## 2014-05-19 MED ORDER — ONDANSETRON HCL 4 MG/2ML IJ SOLN
INTRAMUSCULAR | Status: AC
  Filled 2014-05-19: qty 2

## 2014-05-19 MED ORDER — CEFAZOLIN SODIUM-DEXTROSE 2-3 GM-% IV SOLR
INTRAVENOUS | Status: AC
  Filled 2014-05-19: qty 50

## 2014-05-19 MED ORDER — PHENOL 1.4 % MT LIQD
1.0000 | OROMUCOSAL | Status: DC | PRN
  Filled 2014-05-19: qty 177

## 2014-05-19 MED ORDER — FLEET ENEMA 7-19 GM/118ML RE ENEM: 1.0000 | ENEMA | Freq: Once | RECTAL | Status: AC | PRN

## 2014-05-19 MED ORDER — SODIUM CHLORIDE 0.9 % IV SOLN
2000.0000 mg | INTRAVENOUS | Status: DC | PRN
  Administered 2014-05-19: 2000 mg via INTRAVENOUS

## 2014-05-19 MED ORDER — RIVAROXABAN 10 MG PO TABS
10.0000 mg | ORAL_TABLET | Freq: Every day | ORAL | Status: DC
  Filled 2014-05-19: qty 1

## 2014-05-19 MED ORDER — FENTANYL CITRATE 0.05 MG/ML IJ SOLN
INTRAMUSCULAR | Status: AC
  Filled 2014-05-19: qty 2

## 2014-05-19 MED ORDER — SODIUM CHLORIDE 0.9 % IJ SOLN
INTRAMUSCULAR | Status: DC | PRN
  Administered 2014-05-19: 30 mL

## 2014-05-19 MED ORDER — RIVAROXABAN 10 MG PO TABS
10.0000 mg | ORAL_TABLET | Freq: Every day | ORAL | Status: DC
  Administered 2014-05-20 – 2014-05-21 (×2): 10 mg via ORAL
  Filled 2014-05-19 (×3): qty 1

## 2014-05-19 MED ORDER — MENTHOL 3 MG MT LOZG
1.0000 | LOZENGE | OROMUCOSAL | Status: DC | PRN
  Filled 2014-05-19: qty 9

## 2014-05-19 MED ORDER — DEXAMETHASONE SODIUM PHOSPHATE 10 MG/ML IJ SOLN
INTRAMUSCULAR | Status: AC
  Filled 2014-05-19: qty 1

## 2014-05-19 MED ORDER — TRANEXAMIC ACID 100 MG/ML IV SOLN
2000.0000 mg | Freq: Once | INTRAVENOUS | Status: DC
  Filled 2014-05-19: qty 20

## 2014-05-19 MED ORDER — DEXAMETHASONE SODIUM PHOSPHATE 10 MG/ML IJ SOLN
10.0000 mg | Freq: Once | INTRAMUSCULAR | Status: AC
  Administered 2014-05-19: 10 mg via INTRAVENOUS

## 2014-05-19 MED ORDER — METHOCARBAMOL 500 MG PO TABS
500.0000 mg | ORAL_TABLET | Freq: Four times a day (QID) | ORAL | Status: DC | PRN
  Administered 2014-05-19 – 2014-05-21 (×4): 500 mg via ORAL
  Filled 2014-05-19 (×4): qty 1

## 2014-05-19 MED ORDER — PROPOFOL 10 MG/ML IV BOLUS
INTRAVENOUS | Status: AC
  Filled 2014-05-19: qty 20

## 2014-05-19 MED ORDER — DEXAMETHASONE 6 MG PO TABS
10.0000 mg | ORAL_TABLET | Freq: Every day | ORAL | Status: AC
  Administered 2014-05-20: 10 mg via ORAL
  Filled 2014-05-19: qty 1

## 2014-05-19 MED ORDER — CEFAZOLIN SODIUM-DEXTROSE 2-3 GM-% IV SOLR
2.0000 g | INTRAVENOUS | Status: AC
  Administered 2014-05-19: 2 g via INTRAVENOUS

## 2014-05-19 MED ORDER — BUPIVACAINE LIPOSOME 1.3 % IJ SUSP
20.0000 mL | Freq: Once | INTRAMUSCULAR | Status: DC
  Filled 2014-05-19: qty 20

## 2014-05-19 MED ORDER — MORPHINE SULFATE 2 MG/ML IJ SOLN: 1.0000 mg | INTRAMUSCULAR | Status: DC | PRN

## 2014-05-19 MED ORDER — POLYETHYLENE GLYCOL 3350 17 G PO PACK: 17.0000 g | PACK | Freq: Every day | ORAL | Status: DC | PRN

## 2014-05-19 MED ORDER — FENTANYL CITRATE 0.05 MG/ML IJ SOLN
INTRAMUSCULAR | Status: DC | PRN
  Administered 2014-05-19 (×2): 50 ug via INTRAVENOUS

## 2014-05-19 MED ORDER — EPHEDRINE SULFATE 50 MG/ML IJ SOLN
INTRAMUSCULAR | Status: DC | PRN
  Administered 2014-05-19 (×2): 10 mg via INTRAVENOUS

## 2014-05-19 MED ORDER — CEFAZOLIN SODIUM-DEXTROSE 2-3 GM-% IV SOLR
2.0000 g | Freq: Four times a day (QID) | INTRAVENOUS | Status: AC
  Administered 2014-05-19 (×2): 2 g via INTRAVENOUS
  Filled 2014-05-19 (×2): qty 50

## 2014-05-19 MED ORDER — OXYCODONE HCL 5 MG PO TABS
5.0000 mg | ORAL_TABLET | ORAL | Status: DC | PRN
  Administered 2014-05-19 – 2014-05-21 (×15): 10 mg via ORAL
  Filled 2014-05-19 (×15): qty 2

## 2014-05-19 MED ORDER — PROPOFOL INFUSION 10 MG/ML OPTIME
INTRAVENOUS | Status: DC | PRN
  Administered 2014-05-19: 75 ug/kg/min via INTRAVENOUS

## 2014-05-19 MED ORDER — METHOCARBAMOL 1000 MG/10ML IJ SOLN
500.0000 mg | Freq: Four times a day (QID) | INTRAMUSCULAR | Status: DC | PRN
  Administered 2014-05-19: 500 mg via INTRAVENOUS
  Filled 2014-05-19: qty 5

## 2014-05-19 MED ORDER — SODIUM CHLORIDE 0.9 % IJ SOLN
INTRAMUSCULAR | Status: AC
  Filled 2014-05-19: qty 10

## 2014-05-19 MED ORDER — METOCLOPRAMIDE HCL 10 MG PO TABS: 5.0000 mg | ORAL_TABLET | Freq: Three times a day (TID) | ORAL | Status: DC | PRN

## 2014-05-19 MED ORDER — BISACODYL 10 MG RE SUPP
10.0000 mg | Freq: Every day | RECTAL | Status: DC | PRN
Start: 2014-05-19 — End: 2014-05-21

## 2014-05-19 MED ORDER — MIDAZOLAM HCL 5 MG/5ML IJ SOLN
INTRAMUSCULAR | Status: DC | PRN
  Administered 2014-05-19 (×2): 1 mg via INTRAVENOUS

## 2014-05-19 MED ORDER — EPHEDRINE SULFATE 50 MG/ML IJ SOLN
INTRAMUSCULAR | Status: AC
  Filled 2014-05-19: qty 1

## 2014-05-19 MED ORDER — ONDANSETRON HCL 4 MG/2ML IJ SOLN
4.0000 mg | Freq: Four times a day (QID) | INTRAMUSCULAR | Status: DC | PRN
  Administered 2014-05-20 – 2014-05-21 (×2): 4 mg via INTRAVENOUS
  Filled 2014-05-19 (×2): qty 2

## 2014-05-19 MED ORDER — TRAMADOL HCL 50 MG PO TABS
50.0000 mg | ORAL_TABLET | Freq: Four times a day (QID) | ORAL | Status: DC | PRN
  Administered 2014-05-19: 100 mg via ORAL
  Filled 2014-05-19: qty 2

## 2014-05-19 MED ORDER — CHLORHEXIDINE GLUCONATE 4 % EX LIQD: 60.0000 mL | Freq: Once | CUTANEOUS | Status: DC

## 2014-05-19 MED ORDER — BUPIVACAINE LIPOSOME 1.3 % IJ SUSP
INTRAMUSCULAR | Status: DC | PRN
  Administered 2014-05-19: 20 mL

## 2014-05-19 MED ORDER — DOCUSATE SODIUM 100 MG PO CAPS
100.0000 mg | ORAL_CAPSULE | Freq: Two times a day (BID) | ORAL | Status: DC
  Administered 2014-05-19 – 2014-05-21 (×5): 100 mg via ORAL

## 2014-05-19 MED ORDER — BUPIVACAINE HCL (PF) 0.25 % IJ SOLN
INTRAMUSCULAR | Status: AC
  Filled 2014-05-19: qty 30

## 2014-05-19 MED ORDER — METOCLOPRAMIDE HCL 5 MG/ML IJ SOLN: 5.0000 mg | Freq: Three times a day (TID) | INTRAMUSCULAR | Status: DC | PRN

## 2014-05-19 MED ORDER — MIDAZOLAM HCL 2 MG/2ML IJ SOLN
INTRAMUSCULAR | Status: AC
  Filled 2014-05-19: qty 2

## 2014-05-19 MED ORDER — TORSEMIDE 20 MG PO TABS
20.0000 mg | ORAL_TABLET | Freq: Every morning | ORAL | Status: DC
Start: 2014-05-19 — End: 2014-05-21
  Administered 2014-05-19 – 2014-05-21 (×3): 20 mg via ORAL
  Filled 2014-05-19 (×3): qty 1

## 2014-05-19 MED ORDER — LACTATED RINGERS IV SOLN
INTRAVENOUS | Status: DC | PRN
Start: 2014-05-19 — End: 2014-05-19
  Administered 2014-05-19 (×2): via INTRAVENOUS

## 2014-05-19 MED ORDER — FENTANYL CITRATE 0.05 MG/ML IJ SOLN: 25.0000 ug | INTRAMUSCULAR | Status: DC | PRN

## 2014-05-19 MED ORDER — KETOROLAC TROMETHAMINE 15 MG/ML IJ SOLN
7.5000 mg | Freq: Four times a day (QID) | INTRAMUSCULAR | Status: AC | PRN
  Administered 2014-05-20: 7.5 mg via INTRAVENOUS
  Filled 2014-05-19: qty 1

## 2014-05-19 MED ORDER — DEXTROSE-NACL 5-0.9 % IV SOLN
INTRAVENOUS | Status: DC
  Administered 2014-05-19 – 2014-05-20 (×3): via INTRAVENOUS

## 2014-05-19 MED ORDER — DEXAMETHASONE SODIUM PHOSPHATE 10 MG/ML IJ SOLN
10.0000 mg | Freq: Every day | INTRAMUSCULAR | Status: AC
  Filled 2014-05-19: qty 1

## 2014-05-19 MED ORDER — POTASSIUM CHLORIDE CRYS ER 20 MEQ PO TBCR
20.0000 meq | EXTENDED_RELEASE_TABLET | Freq: Every morning | ORAL | Status: DC
  Administered 2014-05-19 – 2014-05-21 (×3): 20 meq via ORAL
  Filled 2014-05-19 (×3): qty 1

## 2014-05-19 MED ORDER — BUPIVACAINE HCL 0.25 % IJ SOLN
INTRAMUSCULAR | Status: DC | PRN
  Administered 2014-05-19: 20 mL

## 2014-05-19 MED ORDER — ONDANSETRON HCL 4 MG PO TABS
4.0000 mg | ORAL_TABLET | Freq: Four times a day (QID) | ORAL | Status: DC | PRN
  Administered 2014-05-21: 4 mg via ORAL
  Filled 2014-05-19: qty 1

## 2014-05-19 SURGICAL SUPPLY — 61 items
BAG SPEC THK2 15X12 ZIP CLS (MISCELLANEOUS) ×1
BAG ZIPLOCK 12X15 (MISCELLANEOUS) ×2 IMPLANT
BANDAGE ELASTIC 6 VELCRO ST LF (GAUZE/BANDAGES/DRESSINGS) ×2 IMPLANT
BANDAGE ESMARK 6X9 LF (GAUZE/BANDAGES/DRESSINGS) ×1 IMPLANT
BLADE SAG 18X100X1.27 (BLADE) ×2 IMPLANT
BLADE SAW SGTL 11.0X1.19X90.0M (BLADE) ×2 IMPLANT
BNDG CMPR 9X6 STRL LF SNTH (GAUZE/BANDAGES/DRESSINGS) ×1
BNDG ESMARK 6X9 LF (GAUZE/BANDAGES/DRESSINGS) ×2
BOWL SMART MIX CTS (DISPOSABLE) ×2 IMPLANT
CAPT RP KNEE ×1 IMPLANT
CEMENT HV SMART SET (Cement) ×4 IMPLANT
CUFF TOURN SGL QUICK 34 (TOURNIQUET CUFF) ×2
CUFF TRNQT CYL 34X4X40X1 (TOURNIQUET CUFF) ×1 IMPLANT
DECANTER SPIKE VIAL GLASS SM (MISCELLANEOUS) ×2 IMPLANT
DRAPE EXTREMITY TIBURON (DRAPES) ×2 IMPLANT
DRAPE POUCH INSTRU U-SHP 10X18 (DRAPES) ×2 IMPLANT
DRAPE U-SHAPE 47X51 STRL (DRAPES) ×2 IMPLANT
DRSG ADAPTIC 3X8 NADH LF (GAUZE/BANDAGES/DRESSINGS) ×2 IMPLANT
DRSG PAD ABDOMINAL 8X10 ST (GAUZE/BANDAGES/DRESSINGS) ×1 IMPLANT
DURAPREP 26ML APPLICATOR (WOUND CARE) ×2 IMPLANT
ELECT REM PT RETURN 9FT ADLT (ELECTROSURGICAL) ×2
ELECTRODE REM PT RTRN 9FT ADLT (ELECTROSURGICAL) ×1 IMPLANT
EVACUATOR 1/8 PVC DRAIN (DRAIN) ×2 IMPLANT
FACESHIELD WRAPAROUND (MASK) ×10 IMPLANT
FACESHIELD WRAPAROUND OR TEAM (MASK) ×5 IMPLANT
GAUZE SPONGE 4X4 12PLY STRL (GAUZE/BANDAGES/DRESSINGS) ×2 IMPLANT
GLOVE BIO SURGEON STRL SZ7.5 (GLOVE) IMPLANT
GLOVE BIO SURGEON STRL SZ8 (GLOVE) ×2 IMPLANT
GLOVE BIOGEL PI IND STRL 6.5 (GLOVE) IMPLANT
GLOVE BIOGEL PI IND STRL 8 (GLOVE) ×1 IMPLANT
GLOVE BIOGEL PI INDICATOR 6.5 (GLOVE)
GLOVE BIOGEL PI INDICATOR 8 (GLOVE) ×1
GLOVE SURG SS PI 6.5 STRL IVOR (GLOVE) IMPLANT
GOWN STRL REUS W/TWL LRG LVL3 (GOWN DISPOSABLE) ×2 IMPLANT
GOWN STRL REUS W/TWL XL LVL3 (GOWN DISPOSABLE) IMPLANT
HANDPIECE INTERPULSE COAX TIP (DISPOSABLE) ×2
IMMOBILIZER KNEE 20 (SOFTGOODS) ×1 IMPLANT
IMMOBILIZER KNEE 20 THIGH 36 (SOFTGOODS) ×1 IMPLANT
KIT BASIN OR (CUSTOM PROCEDURE TRAY) ×2 IMPLANT
MANIFOLD NEPTUNE II (INSTRUMENTS) ×2 IMPLANT
NDL SAFETY ECLIPSE 18X1.5 (NEEDLE) ×2 IMPLANT
NEEDLE HYPO 18GX1.5 SHARP (NEEDLE) ×4
NS IRRIG 1000ML POUR BTL (IV SOLUTION) ×2 IMPLANT
PACK TOTAL JOINT (CUSTOM PROCEDURE TRAY) ×2 IMPLANT
PAD ABD 8X10 STRL (GAUZE/BANDAGES/DRESSINGS) ×1 IMPLANT
PADDING CAST COTTON 6X4 STRL (CAST SUPPLIES) ×3 IMPLANT
POSITIONER SURGICAL ARM (MISCELLANEOUS) ×2 IMPLANT
SET HNDPC FAN SPRY TIP SCT (DISPOSABLE) ×1 IMPLANT
STRIP CLOSURE SKIN 1/2X4 (GAUZE/BANDAGES/DRESSINGS) ×3 IMPLANT
SUCTION FRAZIER 12FR DISP (SUCTIONS) ×2 IMPLANT
SUT MNCRL AB 4-0 PS2 18 (SUTURE) ×2 IMPLANT
SUT VIC AB 2-0 CT1 27 (SUTURE) ×6
SUT VIC AB 2-0 CT1 TAPERPNT 27 (SUTURE) ×3 IMPLANT
SUT VLOC 180 0 24IN GS25 (SUTURE) ×2 IMPLANT
SYR 20CC LL (SYRINGE) ×2 IMPLANT
SYR 50ML LL SCALE MARK (SYRINGE) ×2 IMPLANT
TOWEL OR 17X26 10 PK STRL BLUE (TOWEL DISPOSABLE) ×2 IMPLANT
TOWEL OR NON WOVEN STRL DISP B (DISPOSABLE) IMPLANT
TRAY FOLEY CATH 14FRSI W/METER (CATHETERS) ×2 IMPLANT
WATER STERILE IRR 1500ML POUR (IV SOLUTION) ×2 IMPLANT
WRAP KNEE MAXI GEL POST OP (GAUZE/BANDAGES/DRESSINGS) ×2 IMPLANT

## 2014-05-19 NOTE — Evaluation (Signed)
Physical Therapy Evaluation Patient Details Name: Amy Dominguez MRN: [id number] DOB: 23-Oct-1935 Today's Date: 05/19/2014   History of Present Illness  s/p R TKA  Clinical Impression  Pt will benefit from PT to address deficits below; Pt should progress well, able to perform independent SLR and amb 80' today. Plan is for HHPT.    Follow Up Recommendations Home health PT    Equipment Recommendations  Rolling walker with 5" wheels    Recommendations for Other Services       Precautions / Restrictions Precautions Precautions: Fall;Knee Precaution Comments: I SLR today, KI not used Required Braces or Orthoses: Knee Immobilizer - Right Knee Immobilizer - Right: Discontinue once straight leg raise with < 10 degree lag Restrictions Other Position/Activity Restrictions: WBAT      Mobility  Bed Mobility Overal bed mobility: Needs Assistance Bed Mobility: Supine to Sit     Supine to sit: Supervision     General bed mobility comments: cues for technique  Transfers Overall transfer level: Needs assistance Equipment used: Rolling walker (2 wheeled) Transfers: Sit to/from Stand           General transfer comment: verbal cues for hand placment and management of operative LE  Ambulation/Gait Ambulation/Gait assistance: Min assist;Min guard Ambulation Distance (Feet): 80 Feet Assistive device: Rolling walker (2 wheeled) Gait Pattern/deviations: Step-to pattern;Step-through pattern   Gait velocity interpretation: Below normal speed for age/gender General Gait Details: cues for seequence, RW position and upward gaze  Stairs            Wheelchair Mobility    Modified Rankin (Stroke Patients Only)       Balance Overall balance assessment: No apparent balance deficits (not formally assessed)                                           Pertinent Vitals/Pain Pain Assessment: 0-10 Pain Score: 2  Pain Location: right knee Pain Descriptors /  Indicators: Aching Pain Intervention(s): Premedicated before session    Home Living Family/patient expects to be discharged to:: Private residence Living Arrangements: Spouse/significant other Available Help at Discharge: Family (dtr) Type of Home: House Home Access: Stairs to enter   [employer] of Steps: 6-7 with one rail Home Layout: One level        Prior Function Level of Independence: Independent               Hand Dominance        Extremity/Trunk Assessment   Upper Extremity Assessment: Overall WFL for tasks assessed           Lower Extremity Assessment: RLE deficits/detail RLE Deficits / Details: able to do SLR at time of this eval; ankle WFL       Communication   Communication: No difficulties  Cognition Arousal/Alertness: Awake/alert Behavior During Therapy: WFL for tasks assessed/performed Overall Cognitive Status: Within Functional Limits for tasks assessed                      General Comments      Exercises Total Joint Exercises Ankle Circles/Pumps: AROM;Both;10 reps Quad Sets: 10 reps;Both;AROM      Assessment/Plan    PT Assessment Patient needs continued PT services  PT Diagnosis Difficulty walking   PT Problem List Decreased range of motion;Decreased mobility;Decreased knowledge of use of DME;Decreased knowledge of precautions;Decreased activity tolerance  PT Treatment Interventions DME  instruction;Gait training;Functional mobility training;Therapeutic activities;Therapeutic exercise;Patient/family education;Stair training   PT Goals (Current goals can be found in the Care Plan section) Acute Rehab PT Goals Patient Stated Goal: to get back to life PT Goal Formulation: With patient Time For Goal Achievement: 05/23/14 Potential to Achieve Goals: Good    Frequency     Barriers to discharge        Co-evaluation               End of Session Equipment Utilized During Treatment: Gait belt Activity  Tolerance: Patient tolerated treatment well Patient left: in chair;with call bell/phone within reach;with family/visitor present Nurse Communication: Mobility status         Time: 1449-1516 PT Time Calculation (min): 27 min   Charges:   PT Evaluation $Initial PT Evaluation Tier I: 1 Procedure PT Treatments $Gait Training: 23-37 mins   PT G Codes:          Wilian Kwong 2014-05-28, 3:27 PM

## 2014-05-19 NOTE — Anesthesia Procedure Notes (Signed)
Spinal  Patient location during procedure: OR Staffing Anesthesiologist: Chai Routh Performed by: anesthesiologist  Preanesthetic Checklist Completed: patient identified, site marked, surgical consent, pre-op evaluation, timeout performed, IV checked, risks and benefits discussed and monitors and equipment checked Spinal Block Patient position: sitting Prep: Betadine Patient monitoring: heart rate, continuous pulse ox and blood pressure Approach: right paramedian Location: L3-4 Injection technique: single-shot Needle Needle type: Spinocan  Needle gauge: 22 G Needle length: 9 cm Additional Notes Expiration date of kit checked and confirmed. Patient tolerated procedure well, without complications.     

## 2014-05-19 NOTE — Interval H&P Note (Signed)
History and Physical Interval Note:  05/19/2014 7:01 AM  Amy Dominguez  has presented today for surgery, with the diagnosis of RIGHT KNEE OSTEOARTHRITIS   The various methods of treatment have been discussed with the patient and family. After consideration of risks, benefits and other options for treatment, the patient has consented to  Procedure(s): RIGHT TOTAL KNEE ARTHROPLASTY (Right) as a surgical intervention .  The patient's history has been reviewed, patient examined, no change in status, stable for surgery.  I have reviewed the patient's chart and labs.  Questions were answered to the patient's satisfaction.     Gearlean Alf

## 2014-05-19 NOTE — H&P (View-Only) (Signed)
Amy Dominguez DOB: 1935/09/14 Married / Language: English / Race: White Female Date of Admission:  05/19/2014 Chief Complaint:  Right Knee Pain History of Present Illness  The patient is a 78 year old female who comes in for a preoperative history and physical. The patient is scheduled for a right total knee arthroplasty to be performed by Dr. Dione Dominguez. Aluisio, MD at [facility] on 05/19/2014. The patient is a 78 year old female who presents for follow up of their knee. The patient is being followed for their right knee pain and osteoarthritis. Symptoms reported today include: pain. The patient feels that they are doing poorly. The following medication has been used for pain control: Ultram. The patient presents today following cortisone injection 09/24/2013 (Patient states that the cortisone injection really helped her last time. The injections have helped in the past She started to hurt again following the injections and wants to get a more permanant fix. She is now ready to proceed with knee replacement. They have been treated conservatively in the past for the above stated problem and despite conservative measures, they continue to have progressive pain and severe functional limitations and dysfunction. They have failed non-operative management including home exercise, medications, and injections. It is felt that they would benefit from undergoing total joint replacement. Risks and benefits of the procedure have been discussed with the patient and they elect to proceed with surgery. There are no active contraindications to surgery such as ongoing infection or rapidly progressive neurological disease.  Allergies TYLENOL - Brand name only - the binding agent - severe migraines. Able to take generic acetaminophens.  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 mother  Social History Pain Contract no Number of flights of stairs before winded 2-3 Current work status retired Tobacco use Never smoker. current some days smoker; smoke(d) less than 1/2 pack(s) per day Marital status married Living situation live with spouse Illicit drug use no Drug/Alcohol Rehab (Currently) no Drug/Alcohol Rehab (Previously) no Children 4 Alcohol use Occasional alcohol use. current drinker; drinks wine; less than 5 per week Exercise Exercises weekly; does gym / weights Post-Surgical Plans Home Advance Directives Living Will, Healthcare POA  Medication History  Claritin (Oral) Specific dose unknown - Active. Multi Vitamin Daily (Oral) Active. Pradaxa (150MG  Capsule, Oral) Active. Torsemide (20MG  Tablet, Oral) Active. Klor-Con M20 [facility] Tablet ER, Oral) Active. Lisinopril (20MG  Tablet, Oral) Active. Vitamin E Active. Fish Oil Active.  Past Surgical History Spinal Surgery Tonsillectomy Adnoids Tubal Ligation Colon Polyp Removal - Colonoscopy Dilation and Curettage of Uterus Hysterectomy Date: 2013. Great Toe Joint Replacement, Left Date: 46.   Review of Systems  General Not Present- Chills, Fatigue, Fever, Memory Loss, Night Sweats, Weight Gain and Weight Loss. Skin Not Present- Eczema, Hives, Itching, Lesions and Rash. HEENT Not Present- Dentures, Double Vision, Headache, Hearing Loss, Tinnitus and Visual Loss. Respiratory Not Present- Allergies, Chronic Cough, Coughing up blood, Shortness of breath at rest and Shortness of breath with exertion. Cardiovascular Not Present- Chest Pain, Difficulty Breathing Lying Down, Murmur, Palpitations, Racing/skipping heartbeats  and Swelling. Gastrointestinal Not Present- Abdominal Pain, Bloody Stool, Constipation, Diarrhea, Difficulty Swallowing, Heartburn, Jaundice, Loss of appetitie, Nausea and Vomiting. Female Genitourinary Not Present- Blood  in Urine, Discharge, Flank Pain, Incontinence, Painful Urination, Urgency, Urinary frequency, Urinary Retention, Urinating at Night and Weak urinary stream. Musculoskeletal Present- Back Pain, Joint Pain and Morning Stiffness. Not Present- Joint Swelling, Muscle Pain, Muscle Weakness and Spasms. Neurological Not Present- Blackout spells, Difficulty with balance, Dizziness, Paralysis, Tremor and Weakness. Psychiatric Not Present- Insomnia.   Vitals  Pulse: 64 (Regular)  Resp.: 14 (Unlabored)  BP: 114/64 (Sitting, Right Arm, Standard)  Physical Exam The physical exam findings are as follows: Note:78 year old female with continued knee pain. Patient is accompanied today by her husband Amy Dominguez.  General Mental Status -Alert, cooperative and good historian. General Appearance-pleasant, Not in acute distress. Orientation-Oriented X3. Build & Nutrition-Well nourished and Well developed.  Head and Neck Head-normocephalic, atraumatic . Neck Global Assessment - supple, no bruit auscultated on the right, no bruit auscultated on the left.  Eye Vision-Wears contact lenses. Pupil - Bilateral-Regular and Round. Motion - Bilateral-EOMI.  Chest and Lung Exam Auscultation Breath sounds - clear at anterior chest wall and clear at posterior chest wall. Adventitious sounds - No Adventitious sounds.  Cardiovascular Auscultation Rhythm - Regular rate and rhythm. Heart Sounds - S1 WNL and S2 WNL. Murmurs & Other Heart Sounds: Murmur 1 - Location - Aortic Area, Pulmonic Area and Sternal Border - Left. Timing - Mid-systolic. Grade - III/VI. Character - Crescendo/Decrescendo.  Abdomen Palpation/Percussion Tenderness - Abdomen is non-tender to palpation. Rigidity  (guarding) - Abdomen is soft. Auscultation Auscultation of the abdomen reveals - Bowel sounds normal.  Female Genitourinary Note: Not done, not pertinent to present illness   Musculoskeletal Note: Well developed female in no distress. Examination of the right knee shows no effusion. Her range of motion in the knee is about 5 to 125. There is marked crepitus on range of motion. She is tender along the medial joint line. There is no lateral tenderness or any instability noted about the knee.  Assessment & Plan Osteoarthritis of right knee (715.96  M17.9) Note:Plan is for a Right Total Knee Replacement by Dr. Wynelle Link.  Plan is to go home with husband.  PCP - Dr. Linna Darner  The patient will receive topical TXA (tranexamic acid) due to: Malignant Fibroid Tumor  Signed electronically by Joelene Millin, III PA-C

## 2014-05-19 NOTE — Anesthesia Postprocedure Evaluation (Signed)
  Anesthesia Post-op Note  Patient: Amy Dominguez  Procedure(s) Performed: Procedure(s) (LRB): RIGHT TOTAL KNEE ARTHROPLASTY (Right)  Patient Location: PACU  Anesthesia Type: Regional SAB  Level of Consciousness: awake and alert   Airway and Oxygen Therapy: Patient Spontanous Breathing  Post-op Pain: mild  Post-op Assessment: Post-op Vital signs reviewed, Patient's Cardiovascular Status Stable, Respiratory Function Stable, Patent Airway and No signs of Nausea or vomiting  Last Vitals:  Filed Vitals:   05/19/14 1250  BP: 136/50  Pulse: 75  Temp: 36.8 C  Resp: 16    Post-op Vital Signs: stable   Complications: No apparent anesthesia complications

## 2014-05-19 NOTE — Op Note (Addendum)
Pre-operative diagnosis- Osteoarthritis  Right knee(s)  Post-operative diagnosis- Osteoarthritis Right knee(s)  Procedure-  Right  Total Knee Arthroplasty  Surgeon- Dione Plover. Marlayna Bannister, MD  Assistant- Ashley Murrain, PA-C   Anesthesia-  Spinal  EBL-* No blood loss amount entered *   Drains Hemovac  Tourniquet time-  Total Tourniquet Time Documented: Thigh (Right) - 30 minutes Total: Thigh (Right) - 30 minutes     Complications- None  Condition-PACU - hemodynamically stable.   Brief Clinical Note  Amy Dominguez is a 78 y.o. year old female with end stage OA of her right knee with progressively worsening pain and dysfunction. She has constant pain, with activity and at rest and significant functional deficits with difficulties even with ADLs. She has had extensive non-op management including analgesics, injections of cortisone and viscosupplements, and home exercise program, but remains in significant pain with significant dysfunction.Radiographs show bone on bone arthritis medial and patellofemoral. She presents now for right Total Knee Arthroplasty.    Procedure in detail---   The patient is brought into the operating room and positioned supine on the operating table. After successful administration of  Spinal,   a tourniquet is placed high on the  Right thigh(s) and the lower extremity is prepped and draped in the usual sterile fashion. Time out is performed by the operating team and then the  Right lower extremity is wrapped in Esmarch, knee flexed and the tourniquet inflated to 300 mmHg.       A midline incision is made with a ten blade through the subcutaneous tissue to the level of the extensor mechanism. A fresh blade is used to make a medial parapatellar arthrotomy. Soft tissue over the proximal medial tibia is subperiosteally elevated to the joint line with a knife and into the semimembranosus bursa with a Cobb elevator. Soft tissue over the proximal lateral tibia is  elevated with attention being paid to avoiding the patellar tendon on the tibial tubercle. The patella is everted, knee flexed 90 degrees and the ACL and PCL are removed. Findings are bone on bone medial and patellofemoral with large global osteophytes.        The drill is used to create a starting hole in the distal femur and the canal is thoroughly irrigated with sterile saline to remove the fatty contents. The 5 degree Right  valgus alignment guide is placed into the femoral canal and the distal femoral cutting block is pinned to remove 10 mm off the distal femur. Resection is made with an oscillating saw.      The tibia is subluxed forward and the menisci are removed. The extramedullary alignment guide is placed referencing proximally at the medial aspect of the tibial tubercle and distally along the second metatarsal axis and tibial crest. The block is pinned to remove 4mm off the more deficient medial  side. Resection is made with an oscillating saw. Size 3is the most appropriate size for the tibia and the proximal tibia is prepared with the modular drill and keel punch for that size.      The femoral sizing guide is placed and size 4 is most appropriate. Rotation is marked off the epicondylar axis and confirmed by creating a rectangular flexion gap at 90 degrees. The size 4 cutting block is pinned in this rotation and the anterior, posterior and chamfer cuts are made with the oscillating saw. The intercondylar block is then placed and that cut is made.      Trial size 3 tibial component, trial  size 4 narrow posterior stabilized femur and a 12.5  mm posterior stabilized rotating platform insert trial is placed. Full extension is achieved with excellent varus/valgus and anterior/posterior balance throughout full range of motion. The patella is everted and thickness measured to be 22  mm. Free hand resection is taken to 12 mm, a 35 template is placed, lug holes are drilled, trial patella is placed, and it  tracks normally. Osteophytes are removed off the posterior femur with the trial in place. All trials are removed and the cut bone surfaces prepared with pulsatile lavage. Cement is mixed and once ready for implantation, the size 3 tibial implant, size  4 narrow posterior stabilized femoral component, and the size 35 patella are cemented in place and the patella is held with the clamp. The trial insert is placed and the knee held in full extension. The Exparel (20 ml mixed with 30 ml saline) and .25% Bupivicaine, are injected into the extensor mechanism, posterior capsule, medial and lateral gutters and subcutaneous tissues.  All extruded cement is removed and once the cement is hard the permanent 12.5 mm posterior stabilized rotating platform insert is placed into the tibial tray.      The wound is copiously irrigated with saline solution and the extensor mechanism closed over a hemovac drain with #1 V-loc suture. The tourniquet is released for a total tourniquet time of 30  minutes. Flexion against gravity is 140 degrees and the patella tracks normally. 2 grams of tranexamic acid mixed with 50 ml saline are then injected into the joint. Subcutaneous tissue is closed with 2.0 vicryl and subcuticular with running 4.0 Monocryl. The incision is cleaned and dried and steri-strips and a bulky sterile dressing are applied. The limb is placed into a knee immobilizer and the patient is awakened and transported to recovery in stable condition.      Please note that a surgical assistant was a medical necessity for this procedure in order to perform it in a safe and expeditious manner. Surgical assistant was necessary to retract the ligaments and vital neurovascular structures to prevent injury to them and also necessary for proper positioning of the limb to allow for anatomic placement of the prosthesis.   Dione Plover Adolpho Meenach, MD    05/19/2014, 8:05 AM

## 2014-05-19 NOTE — Transfer of Care (Signed)
Immediate Anesthesia Transfer of Care Note  Patient: Amy Dominguez  Procedure(s) Performed: Procedure(s): RIGHT TOTAL KNEE ARTHROPLASTY (Right)  Patient Location: PACU  Anesthesia Type:Spinal  Level of Consciousness: awake, alert , oriented and patient cooperative  Airway & Oxygen Therapy: Patient Spontanous Breathing and Patient connected to face mask oxygen  Post-op Assessment: Report given to PACU RN and Post -op Vital signs reviewed and stable  Post vital signs: Reviewed and stable  Complications: No apparent anesthesia complications

## 2014-05-19 NOTE — Anesthesia Preprocedure Evaluation (Addendum)
Anesthesia Evaluation  Patient identified by MRN, date of birth, ID band Patient awake    Reviewed: Allergy & Precautions, H&P , NPO status , Patient's Chart, lab work & pertinent test results, reviewed documented beta blocker date and time   History of Anesthesia Complications (+) history of anesthetic complications (reports slow to wake up)  Airway Mallampati: III TM Distance: >3 FB Neck ROM: full   Comment: Previous difficult intubation under DL. However, Grade I with Glidescope Dental  (+) Teeth Intact   Pulmonary shortness of breath and with exertion, asthma (episodes following "a bad cold", pt denies history of asthma - hasn't  had in 2 years) , Current Smoker, former smoker,  breath sounds clear to auscultation  Pulmonary exam normal       Cardiovascular Exercise Tolerance: Good hypertension, On Medications + dysrhythmias (on dabigatran for anticoagulation - stopped on 05/15/12,) Atrial Fibrillation + pacemaker (per patient 100% paced) + Valvular Problems/Murmurs (mild aortic stenosis) AS Rhythm:regular Rate:Normal  Echo 12/12 showed: EF 65-70%, no aortic valvular issues, trivial mitral regurg   Neuro/Psych negative neurological ROS  negative psych ROS   GI/Hepatic negative GI ROS, Neg liver ROS,   Endo/Other  Hypothyroidism   Renal/GU Renal disease (renal insufficiency - Cr 1.0, calculated GFR 54)     Musculoskeletal  (+) Arthritis - (recent lumbar surgery for spinal stenosis - per patient does not need any special positioning), Osteoarthritis,    Abdominal   Peds  Hematology negative hematology ROS (+)   Anesthesia Other Findings   Reproductive/Obstetrics negative OB ROS                          Anesthesia Physical  Anesthesia Plan  ASA: III  Anesthesia Plan: Spinal   Post-op Pain Management:    Induction:   Airway Management Planned: Simple Face Mask  Additional Equipment:    Intra-op Plan:   Post-operative Plan:   Informed Consent: I have reviewed the patients History and Physical, chart, labs and discussed the procedure including the risks, benefits and alternatives for the proposed anesthesia with the patient or authorized representative who has indicated his/her understanding and acceptance.   Dental Advisory Given  Plan Discussed with: CRNA and Surgeon  Anesthesia Plan Comments: (Allergic to PO brand name tylenol.  Previous difficult intubation under DL. EZ with glidescope.  SAB today)       Anesthesia Quick Evaluation

## 2014-05-19 NOTE — Progress Notes (Signed)
Utilization review completed.  

## 2014-05-20 LAB — BASIC METABOLIC PANEL
Anion gap: 10 (ref 5–15)
BUN: 19 mg/dL (ref 6–23)
CHLORIDE: 100 meq/L (ref 96–112)
CO2: 27 meq/L (ref 19–32)
Calcium: 8.3 mg/dL — ABNORMAL LOW (ref 8.4–10.5)
Creatinine, Ser: 0.78 mg/dL (ref 0.50–1.10)
GFR calc Af Amer: >90 mL/min (ref >90)
GFR calc non Af Amer: 79 mL/min — ABNORMAL LOW (ref >90)
GLUCOSE: 146 mg/dL — AB (ref 70–99)
Potassium: 4 mEq/L (ref 3.7–5.3)
SODIUM: 137 meq/L (ref 137–147)

## 2014-05-20 LAB — CBC
HCT: 28.7 % — ABNORMAL LOW (ref 36.0–46.0)
Hemoglobin: 9.4 g/dL — ABNORMAL LOW (ref 12.0–15.0)
MCH: 29.7 pg (ref 26.0–34.0)
MCHC: 32.8 g/dL (ref 30.0–36.0)
MCV: 90.5 fL (ref 78.0–100.0)
PLATELETS: 153 10*3/uL (ref 150–400)
RBC: 3.17 MIL/uL — AB (ref 3.87–5.11)
RDW: 13.2 % (ref 11.5–15.5)
WBC: 13.9 10*3/uL — AB (ref 4.0–10.5)

## 2014-05-20 MED ORDER — ZOLPIDEM TARTRATE 5 MG PO TABS
5.0000 mg | ORAL_TABLET | Freq: Every evening | ORAL | Status: DC | PRN
  Administered 2014-05-20: 5 mg via ORAL
  Filled 2014-05-20: qty 1

## 2014-05-20 MED ORDER — ACETAMINOPHEN 500 MG PO TABS
500.0000 mg | ORAL_TABLET | Freq: Four times a day (QID) | ORAL | Status: DC | PRN
  Administered 2014-05-20: 1000 mg via ORAL
  Filled 2014-05-20: qty 2

## 2014-05-20 NOTE — Progress Notes (Addendum)
[facility]   [facility] is providing the following services: RW - patient declined commode  If patient discharges after hours, please call [phone].   Linward Headland 05/20/2014, 9:03 AM

## 2014-05-20 NOTE — Progress Notes (Signed)
Physical Therapy Treatment Patient Details Name: Amy Dominguez MRN: [id number] DOB: December 06, 1935 Today's Date: 06-07-14    History of Present Illness s/p R TKA    PT Comments    Pt progressing well    Follow Up Recommendations  Home health PT     Equipment Recommendations  Rolling walker with 5" wheels    Recommendations for Other Services       Precautions / Restrictions Precautions Precautions: Fall;Knee Precaution Comments: I SLR today, KI not used Required Braces or Orthoses: Knee Immobilizer - Right Knee Immobilizer - Right: Discontinue once straight leg raise with < 10 degree lag Restrictions Weight Bearing Restrictions: No Other Position/Activity Restrictions: WBAT    Mobility  Bed Mobility Overal bed mobility: Needs Assistance Bed Mobility: Supine to Sit     Supine to sit: Supervision     General bed mobility comments: cues for technique  Transfers Overall transfer level: Needs assistance Equipment used: Rolling walker (2 wheeled) Transfers: Sit to/from Stand Sit to Stand: Supervision;Min guard         General transfer comment: verbal cues for hand placment and management of operative LE  Ambulation/Gait Ambulation/Gait assistance: Supervision;Min guard Ambulation Distance (Feet): 100 Feet Assistive device: Rolling walker (2 wheeled) Gait Pattern/deviations: Step-to pattern;Step-through pattern     General Gait Details: cues for sequence, RW position and upward gaze   Stairs            Wheelchair Mobility    Modified Rankin (Stroke Patients Only)       Balance                                    Cognition Arousal/Alertness: Awake/alert Behavior During Therapy: WFL for tasks assessed/performed Overall Cognitive Status: Within Functional Limits for tasks assessed                      Exercises Total Joint Exercises Ankle Circles/Pumps: AROM;Both;10 reps Quad Sets: 10 reps;Both;AROM Heel Slides:  AROM;10 reps;Right;AAROM Straight Leg Raises: AROM;Right;10 reps Goniometric ROM: ~65*    General Comments        Pertinent Vitals/Pain Pain Assessment: 0-10 Pain Score: 5  Pain Location: right knee with end range flexion Pain Intervention(s): Limited activity within patient's tolerance;Ice applied    Home Living                      Prior Function            PT Goals (current goals can now be found in the care plan section) Acute Rehab PT Goals Patient Stated Goal: to get back to life PT Goal Formulation: With patient Time For Goal Achievement: 05/23/14 Potential to Achieve Goals: Good Progress towards PT goals: Progressing toward goals    Frequency  7X/week    PT Plan Current plan remains appropriate    Co-evaluation             End of Session Equipment Utilized During Treatment: Gait belt Activity Tolerance: Patient tolerated treatment well Patient left: in chair;with call bell/phone within reach     Time: 0941-1012 PT Time Calculation (min): 31 min  Charges:  $Gait Training: 8-22 mins $Therapeutic Exercise: 8-22 mins                    G Codes:      Alaura Schippers 06-07-2014, 10:19 AM

## 2014-05-20 NOTE — Evaluation (Signed)
Occupational Therapy Evaluation and Discharge Summary Patient Details Name: Amy Dominguez MRN: [id number] DOB: [birth year] Today's Date: 05/20/2014    History of Present Illness s/p R TKA   Clinical Impression   Pt admitted for the above diagnosis and overall is doing very well with adls. Pt requires a few cues for safety when mobilizing during adls but otherwise is at the supervision level with all adls.  Pt has husband at home with her 24/7.    Follow Up Recommendations  No OT follow up    Equipment Recommendations  Toilet riser    Recommendations for Other Services       Precautions / Restrictions Precautions Precautions: Fall;Knee Precaution Comments: I SLR today, KI not used Required Braces or Orthoses: Knee Immobilizer - Right Knee Immobilizer - Right: Discontinue once straight leg raise with < 10 degree lag Restrictions Weight Bearing Restrictions: No Other Position/Activity Restrictions: WBAT      Mobility Bed Mobility Overal bed mobility: Needs Assistance Bed Mobility: Supine to Sit     Supine to sit: Supervision     General bed mobility comments: cues for technique  Transfers Overall transfer level: Needs assistance Equipment used: Rolling walker (2 wheeled) Transfers: Sit to/from Stand Sit to Stand: Supervision;Min guard         General transfer comment: verbal cues for hand placment and management of operative LE    Balance Overall balance assessment: No apparent balance deficits (not formally assessed)                                          ADL Overall ADL's : Needs assistance/impaired Eating/Feeding: Independent   Grooming: Wash/dry hands;Supervision/safety;Standing   Upper Body Bathing: Set up;Sitting   Lower Body Bathing: Set up;Sit to/from stand   Upper Body Dressing : Set up;Sitting   Lower Body Dressing: Set up;Sit to/from stand   Toilet Transfer: Supervision/safety;Comfort height  toilet;Ambulation;RW Toilet Transfer Details (indicate cue type and reason): cues to hold to front of walker when standing and to put operated out in front of her when sitting. Toileting- Clothing Manipulation and Hygiene: Set up;Sit to/from stand   Tub/ Shower Transfer: Walk-in shower;Rolling walker;Supervision/safety   Functional mobility during ADLs: Supervision/safety General ADL Comments: Pt does very well with adls.  Reviewed adl techniques.  Pt would benefit from toilet riser at d/c     Vision                     Perception     Praxis      Pertinent Vitals/Pain Pain Assessment: 0-10 Pain Score: 5  Pain Location: right knee with end range flexion Pain Descriptors / Indicators: Aching Pain Intervention(s): Limited activity within patient's tolerance;Ice applied     Hand Dominance Right   Extremity/Trunk Assessment Upper Extremity Assessment Upper Extremity Assessment: Overall WFL for tasks assessed   Lower Extremity Assessment Lower Extremity Assessment: Defer to PT evaluation RLE Deficits / Details: able to do SLR at time of this eval; ankle WFL   Cervical / Trunk Assessment Cervical / Trunk Assessment: Normal   Communication Communication Communication: No difficulties   Cognition Arousal/Alertness: Awake/alert Behavior During Therapy: WFL for tasks assessed/performed Overall Cognitive Status: Within Functional Limits for tasks assessed                     General Comments  Exercises       Shoulder Instructions      Home Living Family/patient expects to be discharged to:: Private residence Living Arrangements: Spouse/significant other Available Help at Discharge: Family Type of Home: House Home Access: Stairs to enter [employer] of Steps: 6-7 with one rail   Home Layout: One level     Bathroom Shower/Tub: Walk-in shower;Door   [employer] Toilet: Standard Bathroom Accessibility: Yes How Accessible: Accessible  via walker Home Equipment: Shower seat          Prior Functioning/Environment Level of Independence: Independent             OT Diagnosis:     OT Problem List:     OT Treatment/Interventions:      OT Goals(Current goals can be found in the care plan section) Acute Rehab OT Goals Patient Stated Goal: to get back to life  OT Frequency:     Barriers to D/C:            Co-evaluation              End of Session Equipment Utilized During Treatment: Rolling walker CPM Right Knee CPM Right Knee: Off Nurse Communication: Mobility status  Activity Tolerance: Patient tolerated treatment well Patient left: in chair;with call bell/phone within reach   Time: 1000-1015 OT Time Calculation (min): 15 min Charges:  OT General Charges $OT Visit: 1 Procedure OT Evaluation $Initial OT Evaluation Tier I: 1 Procedure OT Treatments $Self Care/Home Management : 8-22 mins G-Codes:    Glenford Peers, OTR/L 05/20/2014, 10:22 AM [phone]

## 2014-05-20 NOTE — Progress Notes (Signed)
   Subjective: 1 Day Post-Op Procedure(s) (LRB): RIGHT TOTAL KNEE ARTHROPLASTY (Right) Patient reports pain as moderate.  Tough night and no sleep.  Will make sure has sleeping pill tonight ordered. Patient seen in rounds for Dr. Wynelle Dominguez. Patient is having problems with pain in the knee, requiring pain medications We will start therapy today.  Plan is to go Home after hospital stay.  Objective: Vital signs in last 24 hours: Temp:  [97.5 F (36.4 C)-99.5 F (37.5 C)] 99.5 F (37.5 C) (09/22 0525) Pulse Rate:  [60-76] 60 (09/22 0525) Resp:  [14-20] 20 (09/22 0525) BP: (111-136)/(41-55) 116/41 mmHg (09/22 0525) SpO2:  [92 %-100 %] 92 % (09/22 0525)  Intake/Output from previous day:  Intake/Output Summary (Last 24 hours) at 05/20/14 0842 Last data filed at 05/20/14 0600  Gross per 24 hour  Intake 2783.75 ml  Output   3890 ml  Net -1106.25 ml    Intake/Output this shift:    Labs:  Recent Labs  05/20/14 0425  HGB 9.4*    Recent Labs  05/20/14 0425  WBC 13.9*  RBC 3.17*  HCT 28.7*  PLT 153    Recent Labs  05/20/14 0425  NA 137  K 4.0  CL 100  CO2 27  BUN 19  CREATININE 0.78  GLUCOSE 146*  CALCIUM 8.3*   No results found for this basename: LABPT, INR,  in the last 72 hours  EXAM General - Patient is Alert, Appropriate and Oriented Extremity - Neurovascular intact Sensation intact distally Dorsiflexion/Plantar flexion intact Dressing - dressing C/D/I Motor Function - intact, moving foot and toes well on exam.  Hemovac pulled without difficulty.  Past Medical History  Diagnosis Date  . Spinal stenosis     spinal stenosis LS spine  . Hypertension   . Asthma 2008 , 2012    RAD after BRONCHITIS    . Headache(784.0)     allergy related  . Symptomatic bradycardia 2011    Insertion of Medtronic Pacer  . 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 Arthritis     hands, feet, hips, knees  . Dysrhythmia     atrial fib-Medtronic Pacemaker-on  pradaxa-off for surgery since 9.25 2013  . Complication of anesthesia     slow to wake up before having pacemaker  . Pacemaker     Assessment/Plan: 1 Day Post-Op Procedure(s) (LRB): RIGHT TOTAL KNEE ARTHROPLASTY (Right) Principal Problem:   OA (osteoarthritis) of knee  Estimated body mass index is 32.18 kg/(m^2) as calculated from the following:   Height as of this encounter: 5\' 2"  (1.575 m).   Weight as of this encounter: 79.833 kg (176 lb). Advance diet Up with therapy Plan for discharge tomorrow Discharge home with home health BP meds on parameters  DVT Prophylaxis - Xarelto Weight-Bearing as tolerated to right leg D/C O2 and Pulse OX and try on Room Air  Arlee Muslim, PA-C Orthopaedic Surgery 05/20/2014, 8:42 AM

## 2014-05-20 NOTE — Progress Notes (Signed)
05/20/14 1300  PT Visit Information  Last PT Received On 05/20/14  Progressing well, slightly nauseated but better since meds; Will practice stairs in am prior to potential D/C  Assistance Needed +1  History of Present Illness s/p R TKA  PT Time Calculation  PT Start Time 1319  PT Stop Time 1336  PT Time Calculation (min) 17 min  Subjective Data  Patient Stated Goal to get back to life  Precautions  Precautions Fall;Knee  Precaution Comments I SLR today, KI not used  Knee Immobilizer - Right Discontinue once straight leg raise with < 10 degree lag  Restrictions  Other Position/Activity Restrictions WBAT  Pain Assessment  Pain Assessment 0-10  Pain Score 5  Pain Location right knee  Pain Intervention(s) Monitored during session;Ice applied;Premedicated before session  Cognition  Arousal/Alertness Awake/alert  Behavior During Therapy WFL for tasks assessed/performed  Overall Cognitive Status Within Functional Limits for tasks assessed  Bed Mobility  Overal bed mobility Needs Assistance  Bed Mobility Sit to Supine  Sit to supine Min assist;Min guard  General bed mobility comments cues for technique, assist getting LE onto bed  Transfers  Equipment used Rolling walker (2 wheeled)  Transfers Sit to/from Stand  Sit to Stand Supervision;Min guard  General transfer comment verbal cues for hand placment and management of operative LE  Ambulation/Gait  Ambulation/Gait assistance Min guard  Ambulation Distance (Feet) 120 Feet (10')  Assistive device Rolling walker (2 wheeled)  Gait Pattern/deviations Step-through pattern;Decreased stride length  General Gait Details occasional cues for sequence, RW position and upward gaze  Total Joint Exercises  Heel Slides AROM;10 reps;Right;AAROM  PT - End of Session  Equipment Utilized During Treatment Gait belt  Activity Tolerance Patient tolerated treatment well  Patient left in bed;with call bell/phone within reach  Nurse Communication  Mobility status  PT - Assessment/Plan  PT Plan Current plan remains appropriate  PT Frequency 7X/week  Follow Up Recommendations Home health PT  PT equipment Rolling walker with 5" wheels  PT Goal Progression  Progress towards PT goals Progressing toward goals  Acute Rehab PT Goals  PT Goal Formulation With patient  Time For Goal Achievement 05/23/14  Potential to Achieve Goals Good  PT General Charges  $$ ACUTE PT VISIT 1 Procedure  PT Treatments  $Gait Training 8-22 mins

## 2014-05-20 NOTE — Discharge Instructions (Addendum)
° °Dr. Frank Aluisio °Total Joint Specialist °[facility] °[address] °[place], [place] [address] °[phone] ° °TOTAL KNEE REPLACEMENT POSTOPERATIVE DIRECTIONS ° ° ° °Knee Rehabilitation, Guidelines Following Surgery  °Results after knee surgery are often greatly improved when you follow the exercise, range of motion and muscle strengthening exercises prescribed by your doctor. Safety measures are also important to protect the knee from further injury. Any time any of these exercises cause you to have increased pain or swelling in your knee joint, decrease the amount until you are comfortable again and slowly increase them. If you have problems or questions, call your caregiver or physical therapist for advice.  ° °HOME CARE INSTRUCTIONS  °Remove items at home which could result in a fall. This includes throw rugs or furniture in walking pathways.  °Continue medications as instructed at time of discharge. °You may have some home medications which will be placed on hold until you complete the course of blood thinner medication.  °You may start showering once you are discharged home but do not submerge the incision under water. Just pat the incision dry and apply a dry gauze dressing on daily. °Walk with walker as instructed.  °You may resume a sexual relationship in one month or when given the OK by  your doctor.  °· Use walker as long as suggested by your caregivers. °· Avoid periods of inactivity such as sitting longer than an hour when not asleep. This helps prevent blood clots.  °You may put full weight on your legs and walk as much as is comfortable.  °You may return to work once you are cleared by your doctor.  °Do not drive a car for 6 weeks or until released by you surgeon.  °· Do not drive while taking narcotics.  °Wear the elastic stockings for three weeks following surgery during the day but you may remove then at night. °Make sure you keep all of your appointments after your  operation with all of your doctors and caregivers. You should call the office at the above phone number and make an appointment for approximately two weeks after the date of your surgery. °Change the dressing daily and reapply a dry dressing each time. °Please pick up a stool softener and laxative for home use as long as you are requiring pain medications. °· Continue to use ice on the knee for pain and swelling from surgery. You may notice swelling that will progress down to the foot and ankle.  This is normal after surgery.  Elevate the leg when you are not up walking on it.   °It is important for you to complete the blood thinner medication as prescribed by your doctor. °· Continue to use the breathing machine which will help keep your temperature down.  It is common for your temperature to cycle up and down following surgery, especially at night when you are not up moving around and exerting yourself.  The breathing machine keeps your lungs expanded and your temperature down. ° °RANGE OF MOTION AND STRENGTHENING EXERCISES  °Rehabilitation of the knee is important following a knee injury or an operation. After just a few days of immobilization, the muscles of the thigh which control the knee become weakened and shrink (atrophy). Knee exercises are designed to build up the tone and strength of the thigh muscles and to improve knee motion. Often times heat used for twenty to thirty minutes before working out will loosen up your tissues and help with improving the   range of motion but do not use heat for the first two weeks following surgery. These exercises can be done on a training (exercise) mat, on the floor, on a table or on a bed. Use what ever works the best and is most comfortable for you Knee exercises include:  Leg Lifts - While your knee is still immobilized in a splint or cast, you can do straight leg raises. Lift the leg to 60 degrees, hold for 3 sec, and slowly lower the leg. Repeat 10-20 times 2-3  times daily. Perform this exercise against resistance later as your knee gets better.  Quad and Hamstring Sets - Tighten up the muscle on the front of the thigh (Quad) and hold for 5-10 sec. Repeat this 10-20 times hourly. Hamstring sets are done by pushing the foot backward against an object and holding for 5-10 sec. Repeat as with quad sets.  A rehabilitation program following serious knee injuries can speed recovery and prevent re-injury in the future due to weakened muscles. Contact your doctor or a physical therapist for more information on knee rehabilitation.   SKILLED REHAB INSTRUCTIONS: If the patient is transferred to a skilled rehab facility following release from the hospital, a list of the current medications will be sent to the facility for the patient to continue.  When discharged from the skilled rehab facility, please have the facility set up the patient's Rulo prior to being released. Also, the skilled facility will be responsible for providing the patient with their medications at time of release from the facility to include their pain medication, the muscle relaxants, and their blood thinner medication. If the patient is still at the rehab facility at time of the two week follow up appointment, the skilled rehab facility will also need to assist the patient in arranging follow up appointment in our office and any transportation needs.  MAKE SURE YOU:  Understand these instructions.  Will watch your condition.  Will get help right away if you are not doing well or get worse.    Pick up stool softner and laxative for home. Do not submerge incision under water. May shower. Continue to use ice for pain and swelling from surgery.  Take Xarelto for two and a half more weeks, then discontinue Xarelto. Once the patient has completed the Xarelto, they may resume the Pradaxa 150 mg.  Information on my medicine - XARELTO (Rivaroxaban)  This medication  education was reviewed with me or my healthcare representative as part of my discharge preparation.  The pharmacist that spoke with me during my hospital stay was:  Altha Harm, PharmD   Why was Xarelto prescribed for you? Xarelto was prescribed for you to reduce the risk of blood clots forming after orthopedic surgery. The medical term for these abnormal blood clots is venous thromboembolism (VTE).  What do you need to know about xarelto ? Take your Xarelto ONCE DAILY at the same time every day.  If you have difficulty swallowing the tablet whole, you may crush it and mix in applesauce just prior to taking your dose.  Take Xarelto exactly as prescribed by your doctor and DO NOT stop taking Xarelto without talking to the doctor who prescribed the medication.  Stopping without other VTE prevention medication to take the place of Xarelto may increase your risk of developing a clot.  After discharge, you should have regular check-up appointments with your healthcare provider that is prescribing your Xarelto.    What do you do if  you miss a dose? If you miss a dose, take it as soon as you remember on the same day then continue your regularly scheduled once daily regimen the next day. Do not take two doses of Xarelto on the same day.   Important Safety Information A possible side effect of Xarelto is bleeding. You should call your healthcare provider right away if you experience any of the following:   Bleeding from an injury or your nose that does not stop.   Unusual colored urine (red or dark brown) or unusual colored stools (red or black).   Unusual bruising for unknown reasons.   A serious fall or if you hit your head (even if there is no bleeding).  Some medicines may interact with Xarelto and might increase your risk of bleeding while on Xarelto. To help avoid this, consult your healthcare provider or pharmacist prior to using any new prescription or non-prescription medications,  including herbals, vitamins, non-steroidal anti-inflammatory drugs (NSAIDs) and supplements.  This website has more information on Xarelto: [email or web address]

## 2014-05-21 LAB — CBC
HCT: 27.8 % — ABNORMAL LOW (ref 36.0–46.0)
Hemoglobin: 9.1 g/dL — ABNORMAL LOW (ref 12.0–15.0)
MCH: 30 pg (ref 26.0–34.0)
MCHC: 32.7 g/dL (ref 30.0–36.0)
MCV: 91.7 fL (ref 78.0–100.0)
PLATELETS: 145 10*3/uL — AB (ref 150–400)
RBC: 3.03 MIL/uL — ABNORMAL LOW (ref 3.87–5.11)
RDW: 13.4 % (ref 11.5–15.5)
WBC: 15.5 10*3/uL — ABNORMAL HIGH (ref 4.0–10.5)

## 2014-05-21 LAB — BASIC METABOLIC PANEL
ANION GAP: 11 (ref 5–15)
BUN: 18 mg/dL (ref 6–23)
CO2: 27 mEq/L (ref 19–32)
CREATININE: 0.69 mg/dL (ref 0.50–1.10)
Calcium: 8.8 mg/dL (ref 8.4–10.5)
Chloride: 99 mEq/L (ref 96–112)
GFR calc non Af Amer: 82 mL/min — ABNORMAL LOW (ref >90)
Glucose, Bld: 160 mg/dL — ABNORMAL HIGH (ref 70–99)
Potassium: 4.3 mEq/L (ref 3.7–5.3)
Sodium: 137 mEq/L (ref 137–147)

## 2014-05-21 MED ORDER — RIVAROXABAN 10 MG PO TABS: 10.0000 mg | ORAL_TABLET | Freq: Every day | ORAL | Status: DC

## 2014-05-21 MED ORDER — TRAMADOL HCL 50 MG PO TABS: 50.0000 mg | ORAL_TABLET | Freq: Four times a day (QID) | ORAL | Status: DC | PRN

## 2014-05-21 MED ORDER — METHOCARBAMOL 500 MG PO TABS: 500.0000 mg | ORAL_TABLET | Freq: Four times a day (QID) | ORAL | Status: DC | PRN

## 2014-05-21 MED ORDER — OXYCODONE HCL 5 MG PO TABS: 5.0000 mg | ORAL_TABLET | ORAL | Status: DC | PRN

## 2014-05-21 NOTE — Progress Notes (Signed)
   Subjective: 2 Days Post-Op Procedure(s) (LRB): RIGHT TOTAL KNEE ARTHROPLASTY (Right) Patient reports pain as mild.   Patient seen in rounds with Dr. Wynelle Link. Patient is well, and has had no acute complaints or problems Patient is ready to go home  Objective: Vital signs in last 24 hours: Temp:  [98.2 F (36.8 C)-98.8 F (37.1 C)] 98.3 F (36.8 C) (09/23 0449) Pulse Rate:  [61-78] 61 (09/23 0449) Resp:  [16-20] 18 (09/23 0449) BP: (122-127)/(44-59) 127/59 mmHg (09/23 0449) SpO2:  [94 %-97 %] 97 % (09/23 0449)  Intake/Output from previous day:  Intake/Output Summary (Last 24 hours) at 05/21/14 1005 Last data filed at 05/21/14 0851  Gross per 24 hour  Intake   1380 ml  Output   1950 ml  Net   -570 ml    Intake/Output this shift: Total I/O In: 240 [P.O.:240] Out: -   Labs:  Recent Labs  05/20/14 0425 05/21/14 0510  HGB 9.4* 9.1*    Recent Labs  05/20/14 0425 05/21/14 0510  WBC 13.9* 15.5*  RBC 3.17* 3.03*  HCT 28.7* 27.8*  PLT 153 145*    Recent Labs  05/20/14 0425 05/21/14 0510  NA 137 137  K 4.0 4.3  CL 100 99  CO2 27 27  BUN 19 18  CREATININE 0.78 0.69  GLUCOSE 146* 160*  CALCIUM 8.3* 8.8   No results found for this basename: LABPT, INR,  in the last 72 hours  EXAM: General - Patient is Alert and Appropriate Extremity - Neurovascular intact Sensation intact distally Incision - clean, dry, no drainage Motor Function - intact, moving foot and toes well on exam.   Assessment/Plan: 2 Days Post-Op Procedure(s) (LRB): RIGHT TOTAL KNEE ARTHROPLASTY (Right) Procedure(s) (LRB): RIGHT TOTAL KNEE ARTHROPLASTY (Right) Past Medical History  Diagnosis Date  . Spinal stenosis     spinal stenosis LS spine  . Hypertension   . Asthma 2008 , 2012    RAD after BRONCHITIS    . Headache(784.0)     allergy related  . Symptomatic bradycardia 2011    Insertion of Medtronic Pacer  . 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 Arthritis     hands,  feet, hips, knees  . Dysrhythmia     atrial fib-Medtronic Pacemaker-on pradaxa-off for surgery since 9.25 2013  . Complication of anesthesia     slow to wake up before having pacemaker  . Pacemaker    Principal Problem:   OA (osteoarthritis) of knee  Estimated body mass index is 32.18 kg/(m^2) as calculated from the following:   Height as of this encounter: 5\' 2"  (1.575 m).   Weight as of this encounter: 79.833 kg (176 lb). Up with therapy Discharge home with home health Diet - Cardiac diet Follow up - in 2 weeks Activity - WBAT Disposition - Home Condition Upon Discharge - Good D/C Meds - See DC Summary DVT Prophylaxis - [place], PA-C Orthopaedic Surgery 05/21/2014, 10:05 AM

## 2014-05-21 NOTE — Discharge Summary (Signed)
Physician Discharge Summary   Patient ID: Amy Dominguez MRN: [id number] DOB/AGE: 76-May-1937 76 y.o.  Admit date: 05/19/2014 Discharge date: 05/21/2014  Primary Diagnosis:  Osteoarthritis Right knee(s)  Admission Diagnoses:  Past Medical History  Diagnosis Date  . Spinal stenosis     spinal stenosis LS spine  . Hypertension   . Asthma 2008 , 2012    RAD after BRONCHITIS    . Headache(784.0)     allergy related  . Symptomatic bradycardia 2011    Insertion of Medtronic Pacer  . 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 Arthritis     hands, feet, hips, knees  . Dysrhythmia     atrial fib-Medtronic Pacemaker-on pradaxa-off for surgery since 9.25 2013  . Complication of anesthesia     slow to wake up before having pacemaker  . Pacemaker    Discharge Diagnoses:   Principal Problem:   OA (osteoarthritis) of knee  Estimated body mass index is 32.18 kg/(m^2) as calculated from the following:   Height as of this encounter: 5' 2"  (1.575 m).   Weight as of this encounter: 79.833 kg (176 lb).  Procedure:  Procedure(s) (LRB): RIGHT TOTAL KNEE ARTHROPLASTY (Right)   Consults: None  HPI: Amy Dominguez is a 78 y.o. year old female with end stage OA of her right knee with progressively worsening pain and dysfunction. She has constant pain, with activity and at rest and significant functional deficits with difficulties even with ADLs. She has had extensive non-op management including analgesics, injections of cortisone and viscosupplements, and home exercise program, but remains in significant pain with significant dysfunction.Radiographs show bone on bone arthritis medial and patellofemoral. She presents now for right Total Knee Arthroplasty.   Laboratory Data: Admission on 05/19/2014, Discharged on 05/21/2014  Component Date Value Ref Range Status  . ABO/RH(D) 05/19/2014 O POS   Final  . Antibody Screen 05/19/2014 POS   Final  . Sample Expiration 05/19/2014 05/22/2014    Final  . Antibody Identification 42/59/5638 ANTI E   Final  . PT AG Type 05/19/2014 NEGATIVE FOR E ANTIGEN   Final  . DAT, IgG 05/19/2014 NEG   Final  . WBC 05/20/2014 13.9* 4.0 - 10.5 K/uL Final  . RBC 05/20/2014 3.17* 3.87 - 5.11 MIL/uL Final  . Hemoglobin 05/20/2014 9.4* 12.0 - 15.0 g/dL Final  . HCT 05/20/2014 28.7* 36.0 - 46.0 % Final  . MCV 05/20/2014 90.5  78.0 - 100.0 fL Final  . MCH 05/20/2014 29.7  26.0 - 34.0 pg Final  . MCHC 05/20/2014 32.8  30.0 - 36.0 g/dL Final  . RDW 05/20/2014 13.2  11.5 - 15.5 % Final  . Platelets 05/20/2014 153  150 - 400 K/uL Final  . Sodium 05/20/2014 137  137 - 147 mEq/L Final  . Potassium 05/20/2014 4.0  3.7 - 5.3 mEq/L Final  . Chloride 05/20/2014 100  96 - 112 mEq/L Final  . CO2 05/20/2014 27  19 - 32 mEq/L Final  . Glucose, Bld 05/20/2014 146* 70 - 99 mg/dL Final  . BUN 05/20/2014 19  6 - 23 mg/dL Final  . Creatinine, Ser 05/20/2014 0.78  0.50 - 1.10 mg/dL Final  . Calcium 05/20/2014 8.3* 8.4 - 10.5 mg/dL Final  . GFR calc non Af Amer 05/20/2014 79* >90 mL/min Final  . GFR calc Af Amer 05/20/2014 >90  >90 mL/min Final   Comment: (NOTE)  The eGFR has been calculated using the CKD EPI equation.                          This calculation has not been validated in all clinical situations.                          eGFR's persistently <90 mL/min signify possible Chronic Kidney                          Disease.  . Anion gap 05/20/2014 10  5 - 15 Final  . WBC 05/21/2014 15.5* 4.0 - 10.5 K/uL Final  . RBC 05/21/2014 3.03* 3.87 - 5.11 MIL/uL Final  . Hemoglobin 05/21/2014 9.1* 12.0 - 15.0 g/dL Final  . HCT 05/21/2014 27.8* 36.0 - 46.0 % Final  . MCV 05/21/2014 91.7  78.0 - 100.0 fL Final  . MCH 05/21/2014 30.0  26.0 - 34.0 pg Final  . MCHC 05/21/2014 32.7  30.0 - 36.0 g/dL Final  . RDW 05/21/2014 13.4  11.5 - 15.5 % Final  . Platelets 05/21/2014 145* 150 - 400 K/uL Final  . Sodium 05/21/2014 137  137 - 147 mEq/L Final  .  Potassium 05/21/2014 4.3  3.7 - 5.3 mEq/L Final  . Chloride 05/21/2014 99  96 - 112 mEq/L Final  . CO2 05/21/2014 27  19 - 32 mEq/L Final  . Glucose, Bld 05/21/2014 160* 70 - 99 mg/dL Final  . BUN 05/21/2014 18  6 - 23 mg/dL Final  . Creatinine, Ser 05/21/2014 0.69  0.50 - 1.10 mg/dL Final  . Calcium 05/21/2014 8.8  8.4 - 10.5 mg/dL Final  . GFR calc non Af Amer 05/21/2014 82* >90 mL/min Final  . GFR calc Af Amer 05/21/2014 >90  >90 mL/min Final   Comment: (NOTE)                          The eGFR has been calculated using the CKD EPI equation.                          This calculation has not been validated in all clinical situations.                          eGFR's persistently <90 mL/min signify possible Chronic Kidney                          Disease.  . Anion gap 05/21/2014 11  5 - 15 Final  Clinical Support on 05/16/2014  Component Date Value Ref Range Status  . Date Time Interrogation Session 05/16/2014 [phone]   Final  . Pulse Generator Manufacturer 05/16/2014 Medtronic   Final  . Pulse Gen Model 05/16/2014 ADDRL1 Adapta   Final  . Pulse Gen Serial Number 05/16/2014 [id number]   Final  . RV Sense Sensitivity 05/16/2014 5.6   Final  . RA Pace Amplitude 05/16/2014 2   Final  . RV Pace PulseWidth 05/16/2014 0.4   Final  . RV Pace Amplitude 05/16/2014 2.5   Final  . RA Impedance 05/16/2014 419   Final  . RA Amplitude 05/16/2014 1.4   Final  . RA Pacing Amplitude 05/16/2014 0.5   Final  . RA Pacing PulseWidth 05/16/2014 0.4   Final  .  RV IMPEDANCE 05/16/2014 630   Final  . RV Amplitude 05/16/2014 16.0   Final  . RV Pacing Amplitude 05/16/2014 0.375   Final  . RV Pacing PulseWidth 05/16/2014 0.4   Final  . Battery Status 05/16/2014 Unknown   Final  . Battery Longevity 05/16/2014 115   Final  . Battery Voltage 05/16/2014 2.8   Final  . Battery Impedance 05/16/2014 213   Final  . Loletha Grayer AP VP Percent 05/16/2014 0   Final  . Loletha Grayer AS VP Percent 05/16/2014 0   Final  . Loletha Grayer  AP VS Percent 05/16/2014 73   Final  . Loletha Grayer AS VS Percent 05/16/2014 26   Final  . Eval Rhythm 05/16/2014 APVS   Final  . Miscellaneous Comment 05/16/2014    Final                   Value:Pacemaker remote check. Device function reviewed. Impedance, sensing, auto capture thresholds consistent with previous measurements. Histograms appropriate for patient and level of activity. All other diagnostic data reviewed and is appropriate and                          stable for patient. Real time/magnet EGM shows appropriate sensing and capture. 132 mode switches (<0.1%)---19 AHR episodes---max dur. 1 hours 23 mins, Max A >400, Max V 208 +Pradaxa. 15 ventricular high rate episodes---max dur. 6 sec, Max V 219---SVT.                          Estimated longevity 9.5 years. Plan to follow up via [facility] on 12-21 and with SK in 10-2014.  Hospital Outpatient Visit on 05/14/2014  Component Date Value Ref Range Status  . MRSA, PCR 05/14/2014 NEGATIVE  NEGATIVE Final  . Staphylococcus aureus 05/14/2014 NEGATIVE  NEGATIVE Final   Comment:                                 The Xpert SA Assay (FDA                          approved for NASAL specimens                          in patients over 81 years of age),                          is one component of                          a comprehensive surveillance                          program.  Test performance has                          been validated by [facility] for patients greater                          than or equal to 1 year  old.                          It is not intended                          to diagnose infection nor to                          guide or monitor treatment.  Marland Kitchen aPTT 05/14/2014 29  24 - 37 seconds Final  . WBC 05/14/2014 8.9  4.0 - 10.5 K/uL Final  . RBC 05/14/2014 4.33  3.87 - 5.11 MIL/uL Final  . Hemoglobin 05/14/2014 12.7  12.0 - 15.0 g/dL Final  . HCT 05/14/2014 39.8  36.0 - 46.0 % Final  . MCV  05/14/2014 91.9  78.0 - 100.0 fL Final  . MCH 05/14/2014 29.3  26.0 - 34.0 pg Final  . MCHC 05/14/2014 31.9  30.0 - 36.0 g/dL Final  . RDW 05/14/2014 13.3  11.5 - 15.5 % Final  . Platelets 05/14/2014 186  150 - 400 K/uL Final  . Sodium 05/14/2014 139  137 - 147 mEq/L Final  . Potassium 05/14/2014 4.3  3.7 - 5.3 mEq/L Final  . Chloride 05/14/2014 101  96 - 112 mEq/L Final  . CO2 05/14/2014 27  19 - 32 mEq/L Final  . Glucose, Bld 05/14/2014 95  70 - 99 mg/dL Final  . BUN 05/14/2014 19  6 - 23 mg/dL Final  . Creatinine, Ser 05/14/2014 0.84  0.50 - 1.10 mg/dL Final  . Calcium 05/14/2014 9.9  8.4 - 10.5 mg/dL Final  . Total Protein 05/14/2014 7.2  6.0 - 8.3 g/dL Final  . Albumin 05/14/2014 4.1  3.5 - 5.2 g/dL Final  . AST 05/14/2014 18  0 - 37 U/L Final  . ALT 05/14/2014 6  0 - 35 U/L Final  . Alkaline Phosphatase 05/14/2014 82  39 - 117 U/L Final  . Total Bilirubin 05/14/2014 0.5  0.3 - 1.2 mg/dL Final  . GFR calc non Af Amer 05/14/2014 66* >90 mL/min Final  . GFR calc Af Amer 05/14/2014 76* >90 mL/min Final   Comment: (NOTE)                          The eGFR has been calculated using the CKD EPI equation.                          This calculation has not been validated in all clinical situations.                          eGFR's persistently <90 mL/min signify possible Chronic Kidney                          Disease.  . Anion gap 05/14/2014 11  5 - 15 Final  . Prothrombin Time 05/14/2014 13.3  11.6 - 15.2 seconds Final  . INR 05/14/2014 1.01  0.00 - 1.49 Final  . Color, Urine 05/14/2014 YELLOW  YELLOW Final  . APPearance 05/14/2014 CLEAR  CLEAR Final  . Specific Gravity, Urine 05/14/2014 1.010  1.005 - 1.030 Final  . pH 05/14/2014 5.5  5.0 - 8.0 Final  . Glucose, UA 05/14/2014 NEGATIVE  NEGATIVE mg/dL Final  . Hgb  urine dipstick 05/14/2014 NEGATIVE  NEGATIVE Final  . Bilirubin Urine 05/14/2014 NEGATIVE  NEGATIVE Final  . Ketones, ur 05/14/2014 NEGATIVE  NEGATIVE mg/dL Final  .  Protein, ur 05/14/2014 NEGATIVE  NEGATIVE mg/dL Final  . Urobilinogen, UA 05/14/2014 0.2  0.0 - 1.0 mg/dL Final  . Nitrite 05/14/2014 NEGATIVE  NEGATIVE Final  . Leukocytes, UA 05/14/2014 NEGATIVE  NEGATIVE Final   MICROSCOPIC NOT DONE ON URINES WITH NEGATIVE PROTEIN, BLOOD, LEUKOCYTES, NITRITE, OR GLUCOSE <1000 mg/dL.     X-Rays:No results found.  EKG: Orders placed in visit on 11/12/13  . EKG 12-LEAD     Hospital Course: Amy Dominguez is a 78 y.o. who was admitted to [facility]. They were brought to the operating room on 05/19/2014 and underwent Procedure(s): RIGHT TOTAL KNEE ARTHROPLASTY.  Patient tolerated the procedure well and was later transferred to the recovery room and then to the orthopaedic floor for postoperative care.  They were given PO and IV analgesics for pain control following their surgery.  They were given 24 hours of postoperative antibiotics of  Anti-infectives   Start     Dose/Rate Route Frequency Ordered Stop   05/19/14 1300  ceFAZolin (ANCEF) IVPB 2 g/50 mL premix     2 g 100 mL/hr over 30 Minutes Intravenous Every 6 hours 05/19/14 0955 05/19/14 1911   05/19/14 0533  ceFAZolin (ANCEF) IVPB 2 g/50 mL premix     2 g 100 mL/hr over 30 Minutes Intravenous On call to O.R. 05/19/14 4431 05/19/14 5400     and started on DVT prophylaxis in the form of Xarelto.   PT and OT were ordered for total joint protocol.  Discharge planning consulted to help with postop disposition and equipment needs.  Patient had a tough night on the evening of surgery and no sleep.  They started to get up OOB with therapy on day one walking over 100 feet twice. Hemovac drain was pulled without difficulty.  Continued to work with therapy into day two.  Dressing was changed on day two and the incision was healing well.  Patient was seen in rounds and was ready to go home.  Discharge home with home health  Diet - Cardiac diet  Follow up - in 2 weeks  Activity - WBAT  Disposition -  Home  Condition Upon Discharge - Good  D/C Meds - See DC Summary  DVT Prophylaxis - Xarelto       Discharge Instructions   Call MD / Call 911    Complete by:  As directed   If you experience chest pain or shortness of breath, CALL 911 and be transported to the hospital emergency room.  If you develope a fever above 101 F, pus (white drainage) or increased drainage or redness at the wound, or calf pain, call your surgeon's office.     Change dressing    Complete by:  As directed   Change dressing daily with sterile 4 x 4 inch gauze dressing and apply TED hose. Do not submerge the incision under water.     Constipation Prevention    Complete by:  As directed   Drink plenty of fluids.  Prune juice may be helpful.  You may use a stool softener, such as Colace (over the counter) 100 mg twice a day.  Use MiraLax (over the counter) for constipation as needed.     Diet - low sodium heart healthy    Complete by:  As directed  Discharge instructions    Complete by:  As directed   Pick up stool softner and laxative for home. Do not submerge incision under water. May shower. Continue to use ice for pain and swelling from surgery.  Take Xarelto for two and a half more weeks, then discontinue Xarelto. Once the patient has completed the Xarelto, they may resume the Pradaxa 150 mg.     Do not put a pillow under the knee. Place it under the heel.    Complete by:  As directed      Do not sit on low chairs, stoools or toilet seats, as it may be difficult to get up from low surfaces    Complete by:  As directed      Driving restrictions    Complete by:  As directed   No driving until released by the physician.     Increase activity slowly as tolerated    Complete by:  As directed      Lifting restrictions    Complete by:  As directed   No lifting until released by the physician.     Patient may shower    Complete by:  As directed   You may shower without a dressing once there is no  drainage.  Do not wash over the wound.  If drainage remains, do not shower until drainage stops.     TED hose    Complete by:  As directed   Use stockings (TED hose) for 3 weeks on both leg(s).  You may remove them at night for sleeping.     Weight bearing as tolerated    Complete by:  As directed             Medication List    STOP taking these medications       dabigatran 150 MG Caps capsule  Commonly known as:  PRADAXA     multivitamins ther. w/minerals Tabs tablet     vitamin E 400 UNIT capsule      TAKE these medications       carisoprodol 350 MG tablet  Commonly known as:  SOMA  Take 350 mg by mouth 4 (four) times daily as needed for muscle spasms.     diphenhydrAMINE 25 MG tablet  Commonly known as:  BENADRYL  Take 25 mg by mouth every 6 (six) hours as needed.     lisinopril 20 MG tablet  Commonly known as:  PRINIVIL,ZESTRIL  Take 20 mg by mouth at bedtime.     methocarbamol 500 MG tablet  Commonly known as:  ROBAXIN  Take 1 tablet (500 mg total) by mouth every 6 (six) hours as needed for muscle spasms.     oxyCODONE 5 MG immediate release tablet  Commonly known as:  Oxy IR/ROXICODONE  Take 1-2 tablets (5-10 mg total) by mouth every 3 (three) hours as needed for moderate pain, severe pain or breakthrough pain.     potassium chloride SA 20 MEQ tablet  Commonly known as:  K-DUR,KLOR-CON  Take 20 mEq by mouth every morning.     rivaroxaban 10 MG Tabs tablet  Commonly known as:  XARELTO  - Take 1 tablet (10 mg total) by mouth daily with breakfast. Take Xarelto for two and a half more weeks, then discontinue Xarelto.  - Once the patient has completed the Xarelto, they may resume the Pradaxa 150 mg.     torsemide 20 MG tablet  Commonly known as:  DEMADEX  Take 20 mg by  mouth every morning.     traMADol 50 MG tablet  Commonly known as:  ULTRAM  Take 1-2 tablets (50-100 mg total) by mouth every 6 (six) hours as needed (mild pain).       Follow-up  Information   Follow up with Gearlean Alf, MD. Schedule an appointment as soon as possible for a visit in 2 weeks. (Call office at [phone] to set up appointment)    Specialty:  Orthopedic Surgery   Contact information:   [address] [place] [address] [phone]       Follow up with [facility].   Contact information:   [place] [address] [phone]       Signed: Arlee Muslim, PA-C Orthopaedic Surgery 05/28/2014, 2:14 PM

## 2014-05-22 NOTE — Progress Notes (Signed)
Discharge summary sent to payer through [facility]  

## 2014-05-25 ENCOUNTER — Other Ambulatory Visit: Payer: Self-pay | Admitting: Oncology

## 2014-06-02 ENCOUNTER — Encounter: Payer: Self-pay | Admitting: Cardiology

## 2014-06-04 ENCOUNTER — Encounter: Payer: Self-pay | Admitting: Oncology

## 2014-06-04 ENCOUNTER — Ambulatory Visit (HOSPITAL_BASED_OUTPATIENT_CLINIC_OR_DEPARTMENT_OTHER): Payer: Medicare Other | Admitting: Oncology

## 2014-06-04 ENCOUNTER — Other Ambulatory Visit (HOSPITAL_BASED_OUTPATIENT_CLINIC_OR_DEPARTMENT_OTHER): Payer: Medicare Other

## 2014-06-04 VITALS — BP 135/50 | HR 88 | Temp 97.5°F | Resp 18 | Ht 62.0 in | Wt 171.3 lb

## 2014-06-04 DIAGNOSIS — Z23 Encounter for immunization: Secondary | ICD-10-CM

## 2014-06-04 DIAGNOSIS — I4891 Unspecified atrial fibrillation: Secondary | ICD-10-CM

## 2014-06-04 DIAGNOSIS — C55 Malignant neoplasm of uterus, part unspecified: Secondary | ICD-10-CM

## 2014-06-04 DIAGNOSIS — F17200 Nicotine dependence, unspecified, uncomplicated: Secondary | ICD-10-CM

## 2014-06-04 LAB — CBC WITH DIFFERENTIAL/PLATELET
BASO%: 0.9 % (ref 0.0–2.0)
BASOS ABS: 0.1 10*3/uL (ref 0.0–0.1)
EOS%: 1.4 % (ref 0.0–7.0)
Eosinophils Absolute: 0.1 10*3/uL (ref 0.0–0.5)
HEMATOCRIT: 34.7 % — AB (ref 34.8–46.6)
HGB: 11.1 g/dL — ABNORMAL LOW (ref 11.6–15.9)
LYMPH%: 20 % (ref 14.0–49.7)
MCH: 29 pg (ref 25.1–34.0)
MCHC: 32 g/dL (ref 31.5–36.0)
MCV: 90.6 fL (ref 79.5–101.0)
MONO#: 0.6 10*3/uL (ref 0.1–0.9)
MONO%: 7.2 % (ref 0.0–14.0)
NEUT#: 6.2 10*3/uL (ref 1.5–6.5)
NEUT%: 70.5 % (ref 38.4–76.8)
Platelets: 343 10*3/uL (ref 145–400)
RBC: 3.83 10*6/uL (ref 3.70–5.45)
RDW: 14.7 % — ABNORMAL HIGH (ref 11.2–14.5)
WBC: 8.8 10*3/uL (ref 3.9–10.3)
lymph#: 1.8 10*3/uL (ref 0.9–3.3)
nRBC: 0 % (ref 0–0)

## 2014-06-04 LAB — COMPREHENSIVE METABOLIC PANEL (CC13)
ALBUMIN: 3.6 g/dL (ref 3.5–5.0)
ALK PHOS: 84 U/L (ref 40–150)
ALT: 6 U/L (ref 0–55)
AST: 16 U/L (ref 5–34)
Anion Gap: 10 mEq/L (ref 3–11)
BILIRUBIN TOTAL: 0.93 mg/dL (ref 0.20–1.20)
BUN: 16 mg/dL (ref 7.0–26.0)
CO2: 28 mEq/L (ref 22–29)
CREATININE: 0.8 mg/dL (ref 0.6–1.1)
Calcium: 10 mg/dL (ref 8.4–10.4)
Chloride: 103 mEq/L (ref 98–109)
GLUCOSE: 121 mg/dL (ref 70–140)
POTASSIUM: 3.6 meq/L (ref 3.5–5.1)
Sodium: 141 mEq/L (ref 136–145)
Total Protein: 6.8 g/dL (ref 6.4–8.3)

## 2014-06-04 MED ORDER — INFLUENZA VAC SPLIT QUAD 0.5 ML IM SUSY
0.5000 mL | PREFILLED_SYRINGE | Freq: Once | INTRAMUSCULAR | Status: DC
  Filled 2014-06-04: qty 0.5

## 2014-06-04 NOTE — Progress Notes (Signed)
OFFICE PROGRESS NOTE   06/04/2014   Physicians:Paola Gehrig, Unice Cobble, Edwinna Areola / Elveria Rising, [name] / S.Eula Flax, [name], Wilhemina Bonito, [name] (podiatry), Verl Blalock, [name]   INTERVAL HISTORY:  Patient is seen, together with husband, in follow up of uterine leiomyosarcoma, on observation since completing adjuvant gemzar taxotere Feb 2014. Last CT CAP was 11-05-13, with ~ 6 month follow up planned; she saw Dr Alycia Rossetti last on 01-09-14. She plans to establish with gyn oncology after she and husband move to [place], tho timing of that move not definite yet.  Patient had been doing very well until right knee surgery by Dr Maureen Ralphs on 05-19-14. The knee and leg are doing well per her follow up with Dr Maureen Ralphs yesterday, however she has had difficult time apparently with pain medication and possibly also with xarelto. She was changed to hydrocodone by Dr Maureen Ralphs yesterday, and feels some better, tho again an episode of restlessness/ anxiety after arriving at this office. She denies associated SOB or chest pain, and these rather nonspecific symptoms resolved after ~ 15 min now. She is back on pradaxa. She will have home PT per Dr Maureen Ralphs.  She denies abdominal or pelvic discomfort, any bleeding or other symptoms of concern from oncologic standpoint.  PAC was removed. She will have flu vaccine elsewhere in next few weeks.  ONCOLOGIC HISTORY   Leiomyosarcoma of uterus   06/15/2012 Initial Diagnosis Leiomyosarcoma of uterus, IA s/p TRH, BSO.   08/06/2012 - 10/22/2012 Chemotherapy 4 cycles of taxotere and gemictabine  Patient had vaginal bleeding Sept/ early Oct 2013. She was seen by gyn within 2-3 days, with ultrasound done, then [facility] with hysteroscopic myomectomy and polypectomy by Dr Charlies Constable on 05-29-12, path [id number] with high grade leiomyosarcoma with >10 mitoses/ 10 HPF, 3.5 cm into outer half of myometrium, no LVSI. This pathology was confirmed at [facility]. She was referred  to Dr Nancy Marus, had CT CAP done in [facility] system 06-12-12 with 2 indeterminate left lung nodules largest 6 mm and 3 cm posterior uterine myometrial mass without apparent extrauterine extension or metastatic disease. She had robotic assisted laparoscopic hysterectomy, bilateral salpingo-oophorectomy 06/15/12 at [facility] by Dr Alycia Rossetti with findings of physiologic adhesions of right adnexum to appendix; small fibroid noted on uterus, otherwise normal abdominal survey; no evidence of extrauterine disease on evaluation. Pathology from the [facility] surgery [phone]) had high grade leiomyosarcoma with > 20 mitoses/10 HPF and invasion into outer half of myometrium, negative LVSI and benign ovaries/tubes. Gyn oncology recommended gemzar/ taxotere off study due to her high risk pathology. 4 cycles of gemzar/taxotere were given from 08-06-12 thru 10-22-12, with dose reduction of gemzar due to skin toxicity and also cytopenias.   Review of systems as above, also: No fever, no symptoms of infection. No nausea. Bowels ok. No LE swelling. Remainder of 10 point Review of Systems negative.  Objective:  Vital signs in last 24 hours:  BP 135/50  Pulse 88  Temp(Src) 97.5 F (36.4 C) (Oral)  Resp 18  Ht 5\' 2"  (1.575 m)  Wt 171 lb 4.8 oz (77.701 kg)  BMI 31.32 kg/m2  SpO2 97%  LMP 09/14/2011 Weight down 5 lbs Alert, oriented and appropriate. Ambulatory with cane. Did not try to get onto exam table now.   HEENT:PERRL, sclerae not icteric. Oral mucosa moist without lesions, posterior pharynx clear.  Neck supple. No JVD.  Lymphatics:no cervical,suraclavicular adenopathy Resp: clear to auscultation bilaterally and normal percussion bilaterally Cardio: regular rate and rhythm. No gallop. GI: soft,  nontender, not distended, no appreciable mass or organomegaly. Normally active bowel sounds. Surgical incision not remarkable. Musculoskeletal/ Extremities: without pitting edema, cords, tenderness. Surgical incision  right knee closed, not tender, no erythema. Able to bend right knee easily past 90 degrees Neuro: no peripheral neuropathy. Otherwise nonfocal Skin without rash, ecchymosis, petechiae   Lab Results:  Results for orders placed in visit on 06/04/14  CBC WITH DIFFERENTIAL      Result Value Ref Range   WBC 8.8  3.9 - 10.3 10e3/uL   NEUT# 6.2  1.5 - 6.5 10e3/uL   HGB 11.1 (*) 11.6 - 15.9 g/dL   HCT 34.7 (*) 34.8 - 46.6 %   Platelets 343  145 - 400 10e3/uL   MCV 90.6  79.5 - 101.0 fL   MCH 29.0  25.1 - 34.0 pg   MCHC 32.0  31.5 - 36.0 g/dL   RBC 3.83  3.70 - 5.45 10e6/uL   RDW 14.7 (*) 11.2 - 14.5 %   lymph# 1.8  0.9 - 3.3 10e3/uL   MONO# 0.6  0.1 - 0.9 10e3/uL   Eosinophils Absolute 0.1  0.0 - 0.5 10e3/uL   Basophils Absolute 0.1  0.0 - 0.1 10e3/uL   NEUT% 70.5  38.4 - 76.8 %   LYMPH% 20.0  14.0 - 49.7 %   MONO% 7.2  0.0 - 14.0 %   EOS% 1.4  0.0 - 7.0 %   BASO% 0.9  0.0 - 2.0 %   nRBC 0  0 - 0 %  COMPREHENSIVE METABOLIC PANEL (GQ67)      Result Value Ref Range   Sodium 141  136 - 145 mEq/L   Potassium 3.6  3.5 - 5.1 mEq/L   Chloride 103  98 - 109 mEq/L   CO2 28  22 - 29 mEq/L   Glucose 121  70 - 140 mg/dl   BUN 16.0  7.0 - 26.0 mg/dL   Creatinine 0.8  0.6 - 1.1 mg/dL   Total Bilirubin 0.93  0.20 - 1.20 mg/dL   Alkaline Phosphatase 84  40 - 150 U/L   AST 16  5 - 34 U/L   ALT 6  0 - 55 U/L   Total Protein 6.8  6.4 - 8.3 g/dL   Albumin 3.6  3.5 - 5.0 g/dL   Calcium 10.0  8.4 - 10.4 mg/dL   Anion Gap 10  3 - 11 mEq/L     Studies/Results:  No results found. Last CT CAP 11-04-2013  Medications: I have reviewed the patient's current medications. Oxycodone DCd 06-03-14 by Dr Maureen Ralphs, now has hydrocodone which she took 3 hrs prior to this visit.  DISCUSSION: Patient will let us know when she feels she can tolerate CT CAP, which she prefers to get here prior to move to [place] particularly so as not to delay this much longer.  Agitation/ anxiety likely medication related,  tho I have cautioned patient and husband that she may need ED evaluation if symptoms persist or are more severe, to be sure not related to some other cardiac or pulmonary condition.  Assessment/Plan:  1.IA high grade leiomyosarcoma of uterus: Completed 4 cycles of adjuvant gemzar/taxotere on 10-22-12. CT CAP stable 11-05-13, due repeat CT also CAP in next several weeks. It will likely be best for her to be seen back to discuss the scan results if timing allows, prior to relocating to [place] and other blood counts recovered  3.PAC needed for chemo and subsequently removed 4.atrial fibrillation and pacer  in, back on pradaxa  5.actively trying to stop smoking. Husband has also stopped smoking now.  6. .HTN, asthma, borderline hypothyroid, chronic bursitis hips, spinal stenosis  7.post right knee arthroplasty 05-19-14,  8. Agitation/ anxiety likely medication related, tho I have cautioned patient and husband that she may need ED evaluation if symptoms persist or are more severe, to be sure not related to some other cardiac or pulmonary condition.   All questions answered and she is in agreement with plans and recommendations above. Time spent 25 min including >50% counseling and coordination of care.  Vedant Shehadeh P, MD   06/04/2014, 10:52 AM

## 2014-06-16 ENCOUNTER — Emergency Department (HOSPITAL_COMMUNITY): Payer: Medicare Other

## 2014-06-16 ENCOUNTER — Ambulatory Visit (INDEPENDENT_AMBULATORY_CARE_PROVIDER_SITE_OTHER): Payer: Medicare Other | Admitting: Internal Medicine

## 2014-06-16 ENCOUNTER — Telehealth: Payer: Self-pay | Admitting: Internal Medicine

## 2014-06-16 ENCOUNTER — Emergency Department (HOSPITAL_COMMUNITY)
Admission: EM | Admit: 2014-06-16 | Discharge: 2014-06-16 | Disposition: A | Discharge status: Home / Self Care | Payer: Medicare Other | Attending: Emergency Medicine | Admitting: Emergency Medicine

## 2014-06-16 ENCOUNTER — Other Ambulatory Visit (INDEPENDENT_AMBULATORY_CARE_PROVIDER_SITE_OTHER): Payer: Medicare Other

## 2014-06-16 ENCOUNTER — Encounter (HOSPITAL_COMMUNITY): Payer: Self-pay | Admitting: Emergency Medicine

## 2014-06-16 ENCOUNTER — Ambulatory Visit (INDEPENDENT_AMBULATORY_CARE_PROVIDER_SITE_OTHER)
Admission: RE | Admit: 2014-06-16 | Discharge: 2014-06-16 | Disposition: A | Discharge status: Home / Self Care | Payer: Medicare Other | Source: Ambulatory Visit | Attending: Internal Medicine | Admitting: Internal Medicine

## 2014-06-16 ENCOUNTER — Encounter: Payer: Self-pay | Admitting: Internal Medicine

## 2014-06-16 VITALS — BP 108/64 | HR 67 | Temp 97.3°F | Resp 16 | Ht 62.0 in | Wt 166.0 lb

## 2014-06-16 DIAGNOSIS — Z87891 Personal history of nicotine dependence: Secondary | ICD-10-CM | POA: Diagnosis not present

## 2014-06-16 DIAGNOSIS — R630 Anorexia: Secondary | ICD-10-CM | POA: Insufficient documentation

## 2014-06-16 DIAGNOSIS — Z8541 Personal history of malignant neoplasm of cervix uteri: Secondary | ICD-10-CM | POA: Diagnosis not present

## 2014-06-16 DIAGNOSIS — R1030 Lower abdominal pain, unspecified: Secondary | ICD-10-CM | POA: Insufficient documentation

## 2014-06-16 DIAGNOSIS — E038 Other specified hypothyroidism: Secondary | ICD-10-CM

## 2014-06-16 DIAGNOSIS — K644 Residual hemorrhoidal skin tags: Secondary | ICD-10-CM | POA: Diagnosis not present

## 2014-06-16 DIAGNOSIS — M159 Polyosteoarthritis, unspecified: Secondary | ICD-10-CM | POA: Insufficient documentation

## 2014-06-16 DIAGNOSIS — Z79899 Other long term (current) drug therapy: Secondary | ICD-10-CM | POA: Diagnosis not present

## 2014-06-16 DIAGNOSIS — K5909 Other constipation: Secondary | ICD-10-CM

## 2014-06-16 DIAGNOSIS — Z8739 Personal history of other diseases of the musculoskeletal system and connective tissue: Secondary | ICD-10-CM | POA: Diagnosis not present

## 2014-06-16 DIAGNOSIS — J45909 Unspecified asthma, uncomplicated: Secondary | ICD-10-CM | POA: Insufficient documentation

## 2014-06-16 DIAGNOSIS — I10 Essential (primary) hypertension: Secondary | ICD-10-CM | POA: Diagnosis not present

## 2014-06-16 DIAGNOSIS — Z950 Presence of cardiac pacemaker: Secondary | ICD-10-CM | POA: Insufficient documentation

## 2014-06-16 DIAGNOSIS — F419 Anxiety disorder, unspecified: Secondary | ICD-10-CM | POA: Insufficient documentation

## 2014-06-16 DIAGNOSIS — M25561 Pain in right knee: Secondary | ICD-10-CM | POA: Diagnosis present

## 2014-06-16 DIAGNOSIS — I4891 Unspecified atrial fibrillation: Secondary | ICD-10-CM | POA: Diagnosis not present

## 2014-06-16 DIAGNOSIS — R7989 Other specified abnormal findings of blood chemistry: Secondary | ICD-10-CM | POA: Insufficient documentation

## 2014-06-16 LAB — COMPREHENSIVE METABOLIC PANEL
ALK PHOS: 97 U/L (ref 39–117)
ALT: 10 U/L (ref 0–35)
AST: 27 U/L (ref 0–37)
Albumin: 3.9 g/dL (ref 3.5–5.2)
BILIRUBIN TOTAL: 1 mg/dL (ref 0.2–1.2)
BUN: 21 mg/dL (ref 6–23)
CO2: 26 mEq/L (ref 19–32)
Calcium: 9.6 mg/dL (ref 8.4–10.5)
Chloride: 96 mEq/L (ref 96–112)
Creatinine, Ser: 1.5 mg/dL — ABNORMAL HIGH (ref 0.4–1.2)
GFR: 35.86 mL/min — ABNORMAL LOW (ref >60.00)
Glucose, Bld: 110 mg/dL — ABNORMAL HIGH (ref 70–99)
Potassium: 3.9 mEq/L (ref 3.5–5.1)
SODIUM: 135 meq/L (ref 135–145)
TOTAL PROTEIN: 7.5 g/dL (ref 6.0–8.3)

## 2014-06-16 LAB — CBC WITH DIFFERENTIAL/PLATELET
BASOS ABS: 0.1 10*3/uL (ref 0.0–0.1)
Basophils Relative: 0.4 % (ref 0.0–3.0)
Eosinophils Absolute: 0 10*3/uL (ref 0.0–0.7)
Eosinophils Relative: 0.4 % (ref 0.0–5.0)
HEMATOCRIT: 39.6 % (ref 36.0–46.0)
Hemoglobin: 13 g/dL (ref 12.0–15.0)
LYMPHS ABS: 3 10*3/uL (ref 0.7–4.0)
Lymphocytes Relative: 23.4 % (ref 12.0–46.0)
MCHC: 32.9 g/dL (ref 30.0–36.0)
MCV: 87.1 fl (ref 78.0–100.0)
MONO ABS: 1.1 10*3/uL — AB (ref 0.1–1.0)
Monocytes Relative: 8.8 % (ref 3.0–12.0)
NEUTROS PCT: 67 % (ref 43.0–77.0)
Neutro Abs: 8.6 10*3/uL — ABNORMAL HIGH (ref 1.4–7.7)
PLATELETS: 258 10*3/uL (ref 150.0–400.0)
RBC: 4.54 Mil/uL (ref 3.87–5.11)
RDW: 14.9 % (ref 11.5–15.5)
WBC: 12.9 10*3/uL — ABNORMAL HIGH (ref 4.0–10.5)

## 2014-06-16 LAB — I-STAT CHEM 8, ED
BUN: 26 mg/dL — ABNORMAL HIGH (ref 6–23)
CHLORIDE: 98 meq/L (ref 96–112)
Calcium, Ion: 1.15 mmol/L (ref 1.13–1.30)
Creatinine, Ser: 1.5 mg/dL — ABNORMAL HIGH (ref 0.50–1.10)
Glucose, Bld: 104 mg/dL — ABNORMAL HIGH (ref 70–99)
HCT: 40 % (ref 36.0–46.0)
Hemoglobin: 13.6 g/dL (ref 12.0–15.0)
Potassium: 4 mEq/L (ref 3.7–5.3)
Sodium: 133 mEq/L — ABNORMAL LOW (ref 137–147)
TCO2: 25 mmol/L (ref 0–100)

## 2014-06-16 LAB — LIPASE: Lipase: 58 U/L (ref 11.0–59.0)

## 2014-06-16 LAB — URINALYSIS, ROUTINE W REFLEX MICROSCOPIC
BILIRUBIN URINE: NEGATIVE
HGB URINE DIPSTICK: NEGATIVE
Ketones, ur: NEGATIVE
NITRITE: NEGATIVE
Specific Gravity, Urine: 1.01 (ref 1.000–1.030)
Total Protein, Urine: NEGATIVE
Urine Glucose: NEGATIVE
Urobilinogen, UA: 0.2 (ref 0.0–1.0)
pH: 6 (ref 5.0–8.0)

## 2014-06-16 LAB — AMYLASE: Amylase: 117 U/L (ref 27–131)

## 2014-06-16 LAB — SEDIMENTATION RATE: Sed Rate: 15 mm/hr (ref 0–22)

## 2014-06-16 LAB — I-STAT CG4 LACTIC ACID, ED: Lactic Acid, Venous: 1.28 mmol/L (ref 0.5–2.2)

## 2014-06-16 LAB — TSH: TSH: 1.57 u[IU]/mL (ref 0.35–4.50)

## 2014-06-16 MED ORDER — ALPRAZOLAM 0.25 MG PO TABS: 0.2500 mg | ORAL_TABLET | Freq: Three times a day (TID) | ORAL | Status: DC | PRN

## 2014-06-16 MED ORDER — SODIUM CHLORIDE 0.9 % IV SOLN
INTRAVENOUS | Status: DC
  Administered 2014-06-16: 20:00:00 via INTRAVENOUS

## 2014-06-16 MED ORDER — IOHEXOL 300 MG/ML  SOLN
80.0000 mL | Freq: Once | INTRAMUSCULAR | Status: AC | PRN
  Administered 2014-06-16: 80 mL via INTRAVENOUS

## 2014-06-16 MED ORDER — SODIUM CHLORIDE 0.9 % IV BOLUS (SEPSIS)
1000.0000 mL | Freq: Once | INTRAVENOUS | Status: AC
  Administered 2014-06-16: 1000 mL via INTRAVENOUS

## 2014-06-16 MED ORDER — LORAZEPAM 2 MG/ML IJ SOLN
1.0000 mg | Freq: Once | INTRAMUSCULAR | Status: AC
  Administered 2014-06-16: 1 mg via INTRAVENOUS
  Filled 2014-06-16: qty 1

## 2014-06-16 MED ORDER — METOCLOPRAMIDE HCL 5 MG/ML IJ SOLN
5.0000 mg | Freq: Once | INTRAMUSCULAR | Status: AC
  Administered 2014-06-16: 5 mg via INTRAVENOUS
  Filled 2014-06-16: qty 2

## 2014-06-16 MED ORDER — DIPHENHYDRAMINE HCL 50 MG/ML IJ SOLN
25.0000 mg | Freq: Once | INTRAMUSCULAR | Status: AC
  Administered 2014-06-16: 25 mg via INTRAVENOUS
  Filled 2014-06-16: qty 1

## 2014-06-16 MED ORDER — LORAZEPAM 2 MG/ML IJ SOLN
1.0000 mg | Freq: Once | INTRAMUSCULAR | Status: AC
Start: 2014-06-16 — End: 2014-06-16
  Administered 2014-06-16: 1 mg via INTRAVENOUS
  Filled 2014-06-16: qty 1

## 2014-06-16 NOTE — ED Notes (Signed)
Pt sent here from pcp for elevated creatinine level of 1.5. Pt also reports R knee pain, has not been able to take pain prescription due to adverse effects.

## 2014-06-16 NOTE — ED Notes (Signed)
Pt out of room for CT 

## 2014-06-16 NOTE — Telephone Encounter (Signed)
Patient called.  She states she is very impatient and is requesting results of labs done this morning.

## 2014-06-16 NOTE — Telephone Encounter (Signed)
Spoke to her, she is going to the ER at [facility]

## 2014-06-16 NOTE — Telephone Encounter (Signed)
Notified pt with md response. Pt states she is still in a lot of pain. She is completely in distress and recommending md call her back ASAP. She doesn't mind getting admitted prefer [place] if possible...Johny Chess

## 2014-06-16 NOTE — Assessment & Plan Note (Signed)
She is in significant distress and tells me that "something is very wrong with me". Her abd series was normal. Her labs show a slightly elevated WBC ct and a significant increase in her creatinine The other labs are WNL and her UA was bland She will proceed to the ER for urgent work-up and pain relief

## 2014-06-16 NOTE — Assessment & Plan Note (Signed)
Plain films do not show any stool retention

## 2014-06-16 NOTE — Telephone Encounter (Signed)
Xray was normal, there is no constipation Labs are so-so: she has had a rather significant decrease in her renal function and her WBC count is slightly elevated, her urine is normal, the other labs are okay. If she is still in a lot of pain then she may need to be admitted to find out the cause for her elevated WBC count and renal decline. Is she okay with being seen in the ER this afternoon.

## 2014-06-16 NOTE — Progress Notes (Addendum)
Subjective:    Patient ID: Amy Dominguez, female    DOB: 1936/03/03, 78 y.o.   MRN: [id number]  Abdominal Pain This is a recurrent problem. Episode onset: 12 days ago. The onset quality is gradual. The problem occurs intermittently. The most recent episode lasted 12 days. The problem has been unchanged. The pain is located in the RLQ and LLQ. The pain is at a severity of 4/10. The pain is moderate. The quality of the pain is aching and burning. The abdominal pain does not radiate. Associated symptoms include anorexia, arthralgias, constipation, flatus and nausea. Pertinent negatives include no belching, diarrhea, dysuria, fever, frequency, headaches, hematochezia, hematuria, melena, myalgias, vomiting or weight loss. Nothing aggravates the pain. The pain is relieved by nothing. She has tried acetaminophen and oral narcotic analgesics for the symptoms. The treatment provided no relief. Her past medical history is significant for GERD.      Review of Systems  Constitutional: Positive for fatigue. Negative for fever, chills, weight loss, diaphoresis, activity change, appetite change and unexpected weight change.  HENT: Negative.   Eyes: Negative.   Respiratory: Negative.  Negative for cough, choking, chest tightness, shortness of breath and stridor.   Cardiovascular: Negative.  Negative for chest pain, palpitations and leg swelling.  Gastrointestinal: Positive for nausea, abdominal pain, constipation, anorexia and flatus. Negative for vomiting, diarrhea, blood in stool, melena, hematochezia, abdominal distention, anal bleeding and rectal pain.  Endocrine: Negative.   Genitourinary: Negative.  Negative for dysuria, urgency, frequency, hematuria, flank pain, decreased urine volume, vaginal bleeding, vaginal discharge, enuresis, difficulty urinating, genital sores, vaginal pain, menstrual problem, pelvic pain and dyspareunia.  Musculoskeletal: Positive for arthralgias. Negative for back pain, gait  problem, joint swelling, myalgias, neck pain and neck stiffness.  Skin: Negative.   Allergic/Immunologic: Negative.   Neurological: Negative.  Negative for headaches.  Hematological: Negative.  Negative for adenopathy. Does not bruise/bleed easily.  Psychiatric/Behavioral: Negative.        Objective:   Physical Exam  Vitals reviewed. Constitutional: She is oriented to person, place, and time. Vital signs are normal. She appears well-developed and well-nourished.  Non-toxic appearance. She does not have a sickly appearance. She does not appear ill. She appears distressed.  HENT:  Head: Normocephalic and atraumatic.  Mouth/Throat: Oropharynx is clear and moist. No oropharyngeal exudate.  Eyes: Conjunctivae are normal. Right eye exhibits no discharge. Left eye exhibits no discharge. No scleral icterus.  Neck: Normal range of motion. Neck supple. No JVD present. No tracheal deviation present. No thyromegaly present.  Cardiovascular: Normal rate, regular rhythm, normal heart sounds and intact distal pulses.  Exam reveals no gallop and no friction rub.   No murmur heard. Pulmonary/Chest: Effort normal and breath sounds normal. No stridor. No respiratory distress. She has no wheezes. She has no rales. She exhibits no tenderness.  Abdominal: Soft. Normal appearance and bowel sounds are normal. She exhibits no shifting dullness, no distension, no pulsatile liver, no fluid wave, no abdominal bruit, no ascites, no pulsatile midline mass and no mass. There is no hepatosplenomegaly, splenomegaly or hepatomegaly. There is generalized tenderness. There is no rigidity, no rebound, no guarding, no CVA tenderness, no tenderness at McBurney's point and negative Murphy's sign. No hernia. Hernia confirmed negative in the ventral area, confirmed negative in the right inguinal area and confirmed negative in the left inguinal area.  Genitourinary: Rectal exam shows external hemorrhoid and internal hemorrhoid. Rectal  exam shows no fissure, no mass, no tenderness and anal tone normal. Guaiac negative  stool.  Musculoskeletal: Normal range of motion. She exhibits no edema and no tenderness.  Lymphadenopathy:    She has no cervical adenopathy.  Neurological: She is oriented to person, place, and time.  Skin: Skin is warm and dry. No rash noted. She is not diaphoretic. No erythema. No pallor.  Psychiatric: She has a normal mood and affect. Her behavior is normal. Judgment and thought content normal.      Lab Results  Component Value Date   WBC 8.8 06/04/2014   HGB 11.1* 06/04/2014   HCT 34.7* 06/04/2014   PLT 343 06/04/2014   GLUCOSE 121 06/04/2014   CHOL 219* 12/28/2011   TRIG 121.0 12/28/2011   HDL 73.50 12/28/2011   LDLDIRECT 125.9 12/28/2011   ALT 6 06/04/2014   AST 16 06/04/2014   NA 141 06/04/2014   K 3.6 06/04/2014   CL 99 05/21/2014   CREATININE 0.8 06/04/2014   BUN 16.0 06/04/2014   CO2 28 06/04/2014   TSH 1.95 12/28/2011   INR 1.01 05/14/2014   HGBA1C 5.8 09/16/2009      Assessment & Plan:

## 2014-06-16 NOTE — Patient Instructions (Signed)

## 2014-06-16 NOTE — Discharge Instructions (Signed)
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 Return at once for any trouble breathing, chest pain, fever or any other problems. Take tramadol as directed for pain but use a reduced dose, only one pill, if taking it with xanax

## 2014-06-16 NOTE — Assessment & Plan Note (Signed)
Her TSH is on the normal range 

## 2014-06-16 NOTE — ED Provider Notes (Signed)
CSN: [id number]     Arrival date & time 06/16/14  1613 History   First MD Initiated Contact with Patient 06/16/14 1655     Chief Complaint  Patient presents with  . Knee Pain  . Abnormal Lab    creatinine     (Consider location/radiation/quality/duration/timing/severity/associated sxs/prior Treatment) HPI Comments: Pt here with 10 days of weakness and decreased appetite--seen by her pcp and labs reviewed including cbc,cmet,ua,tsh, acute abd series showed increased creatintine from baseline--denies fever, chills, emesis, diarrhea--no cough or congestion--some constipation and pt had a rectal exam today that showed ext hemm.--spoke with pts pcp, dr. Alona Bene, and he is concerned about possible abd pathology and recc abd ct scan  Patient is a 78 y.o. female presenting with knee pain. The history is provided by the patient and the spouse.  Knee Pain   Past Medical History  Diagnosis Date  . Spinal stenosis     spinal stenosis LS spine  . Hypertension   . Asthma 2008 , 2012    RAD after BRONCHITIS    . Headache(784.0)     allergy related  . Symptomatic bradycardia 2011    Insertion of Medtronic Pacer  . 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 Arthritis     hands, feet, hips, knees  . Dysrhythmia     atrial fib-Medtronic Pacemaker-on pradaxa-off for surgery since 9.25 2013  . Complication of anesthesia     slow to wake up before having pacemaker  . Pacemaker    Past Surgical History  Procedure Laterality Date  . Polypectomy  2008     negative 2011,Dr Sharlett Iles  . Hemorrhoid surgery    . Tonsillectomy    . Tubal ligation    . Insert / replace / remove pacemaker  DR [facility] pacemaker  09/2009  . Foot surgery      JOINT REPLACEMENT LT GREAT TOE   20 YRS AGO   . Cataract extraction      OS  . Lumbar laminectomy/decompression microdiscectomy  09/21/2011    Procedure: LUMBAR LAMINECTOMY/DECOMPRESSION MICRODISCECTOMY;  Surgeon: Eustace Moore, MD;  Location: [place]  [facility];  Service: Neurosurgery;  Laterality: N/A;  lumbar laminectomy lumbar four-lumbar five  . Hysteroscopy w/d&c  05/29/2012    Procedure: DILATATION AND CURETTAGE /HYSTEROSCOPY;  Surgeon: Azalia Bilis, MD;  Location: [facility];  Service: Gynecology;  Laterality: N/A;  W/ Resectascope  . Dilation and evacuation  05/29/2012    Procedure: DILATATION AND EVACUATION;  Surgeon: Azalia Bilis, MD;  Location: [facility];  Service: Gynecology;  Laterality: N/A;  . Abdominal hysterectomy    . Back surgery    . Total knee arthroplasty Right 05/19/2014    Procedure: RIGHT TOTAL KNEE ARTHROPLASTY;  Surgeon: Gearlean Alf, MD;  Location: [facility];  Service: Orthopedics;  Laterality: Right;   Family History  Problem Relation Age of Onset  . Stroke Mother 52    post valve replacement; warfarin missed  . Heart attack Father 11  . Breast cancer Sister   . Deep vein thrombosis Daughter     Factor S non functional  . Diabetes Neg Hx    History  Substance Use Topics  . Smoking status: Former Smoker -- 0.25 packs/day for 54 years    Types: Cigarettes    Quit date: 06/05/2013  . Smokeless tobacco: Never Used     Comment: smoked 1957-2014, up to < 1 ppd (off 5 years )  . Alcohol Use:  1.8 oz/week    3 Glasses of wine per week     Comment:  occasionally   OB History   Grav Para Term Preterm Abortions TAB SAB Ect Mult Living                 Review of Systems  All other systems reviewed and are negative.     Allergies  Tylenol  Home Medications   Prior to Admission medications   Medication Sig Start Date End Date Taking? Authorizing Provider  carisoprodol (SOMA) 350 MG tablet Take 350 mg by mouth 4 (four) times daily as needed for muscle spasms.   Yes Historical Provider, MD  lisinopril (PRINIVIL,ZESTRIL) 20 MG tablet Take 20 mg by mouth at bedtime.   Yes Historical Provider, MD  potassium chloride SA (K-DUR,KLOR-CON) 20 MEQ tablet Take 20 mEq by mouth every morning.   Yes Historical  Provider, MD  PRADAXA 150 MG CAPS capsule Take 1 capsule by mouth 2 (two) times daily. 05/10/14  Yes Historical Provider, MD  torsemide (DEMADEX) 20 MG tablet Take 20 mg by mouth every morning.   Yes Historical Provider, MD  zolpidem (AMBIEN) 10 MG tablet Take 10 mg by mouth at bedtime as needed for sleep.   Yes Historical Provider, MD   BP 120/96  Pulse 48  Temp(Src) 98 F (36.7 C) (Oral)  Resp 20  SpO2 99%  LMP 09/14/2011 Physical Exam  Nursing note and vitals reviewed. Constitutional: She is oriented to person, place, and time. She appears well-developed and well-nourished.  Non-toxic appearance. No distress.  HENT:  Head: Normocephalic and atraumatic.  Eyes: Conjunctivae, EOM and lids are normal. Pupils are equal, round, and reactive to light.  Neck: Normal range of motion. Neck supple. No tracheal deviation present. No mass present.  Cardiovascular: Normal rate, regular rhythm and normal heart sounds.  Exam reveals no gallop.   No murmur heard. Pulmonary/Chest: Effort normal and breath sounds normal. No stridor. No respiratory distress. She has no decreased breath sounds. She has no wheezes. She has no rhonchi. She has no rales.  Abdominal: Soft. Normal appearance and bowel sounds are normal. She exhibits no distension. There is no tenderness. There is no rebound and no CVA tenderness.  Musculoskeletal: Normal range of motion. She exhibits no edema and no tenderness.       Legs: Neurological: She is alert and oriented to person, place, and time. She has normal strength. No cranial nerve deficit or sensory deficit. GCS eye subscore is 4. GCS verbal subscore is 5. GCS motor subscore is 6.  Skin: Skin is warm and dry. No abrasion and no rash noted.  Psychiatric: Her speech is normal and behavior is normal. Her mood appears anxious.    ED Course  Procedures (including critical care time) Labs Review Labs Reviewed  SEDIMENTATION RATE    Imaging Review Dg Abd Acute  W/chest  06/16/2014   CLINICAL DATA:  Lower abdomen pain for 12 days.  Nausea.  EXAM: ACUTE ABDOMEN SERIES (ABDOMEN 2 VIEW & CHEST 1 VIEW)  COMPARISON:  None.  FINDINGS: There is no evidence of dilated bowel loops or free intraperitoneal air. No radiopaque calculi or other significant radiographic abnormality is seen. There are degenerative joint changes and scoliosis of spine. Heart size and mediastinal contours are within normal limits. Cardiac pacemaker is noted. Both lungs are clear.  IMPRESSION: Negative abdominal radiographs.  No acute cardiopulmonary disease.   Electronically Signed   By: Abelardo Diesel M.D.   On: 06/16/2014  12:23     EKG Interpretation None      MDM   Final diagnoses:  None    Patient given 2 L of IV fluids here for likely prerenal elevated creatinine. X-ray results discussed with patient and she will followup with her oncologist tomorrow. Patient is very anxious here and given Ativan which did resolve her symptoms. Patient denies any chest pain shortness of breath. Denies any pleuritic symptoms. Do not think that patient has PE. Repeat abdominal exam remained stable. Will give patient prescription for Xanax and return precautions given    Leota Jacobsen, MD 06/16/14 2133

## 2014-06-16 NOTE — Progress Notes (Signed)
Pre visit review using our clinic review tool, if applicable. No additional management support is needed unless otherwise documented below in the visit note. 

## 2014-06-17 ENCOUNTER — Telehealth: Payer: Self-pay | Admitting: *Deleted

## 2014-06-17 ENCOUNTER — Telehealth: Payer: Self-pay | Admitting: Oncology

## 2014-06-17 ENCOUNTER — Other Ambulatory Visit: Payer: Self-pay | Admitting: Oncology

## 2014-06-17 ENCOUNTER — Encounter: Payer: Self-pay | Admitting: Cardiology

## 2014-06-17 DIAGNOSIS — C55 Malignant neoplasm of uterus, part unspecified: Secondary | ICD-10-CM

## 2014-06-17 DIAGNOSIS — M8448XA Pathological fracture, other site, initial encounter for fracture: Secondary | ICD-10-CM

## 2014-06-17 NOTE — Telephone Encounter (Signed)
Message copied by Christa See on Tue Jun 17, 2014  3:21 PM ------      Message from: Gordy Levan      Created: Tue Jun 17, 2014  9:47 AM      Regarding: RE: New CT scan results       Please let pt know that I have looked at the CT and ED information. I have ordered CT chest without IV contrast (due to increased creatinine) and requested CT biopsy probably of the soft tissue changes around posterior 12th rib. She will need to be off Pradaxa prior to biopsy, which radiology should instruct her about. I will see her ~ Oct 30 (or Nov 2 or 4) depending on timing of the biopsy. Fine to use prn xanax from Dr Vivi Martens in ED. Push po fluids       Orders in , POF done       Thanks             ----- Message -----         From: Christa See, RN         Sent: 06/17/2014   8:59 AM           To: Baruch Merl, RN, Gordy Levan, MD      Subject: New CT scan results                                      Pt left VM stating she went to the ED last night due to dehydration, elevated creatinine - had CT abd/pelvis. Very anxious about findings noted below and wants to get in with you for f/u or additional scan? Thought I would let you know before calling her back.             Findings from CT: 2.5 cm destructive soft tissue lesion involving the right posterior 12th rib. Additional 9 mm lytic lesion with pathologic fracture involving the right lateral 10th rib. These findings are new from      prior CT and are suspicious for myeloma or metastases.            Please advise. Thanks! Coden Franchi       ------

## 2014-06-17 NOTE — Telephone Encounter (Signed)
[facility] Triage Call Report Triage Record Num: [id number] Operator: Buford Dresser Patient Name: Amy Dominguez Call Date & Time: 06/15/2014 5:56:24PM Patient Phone: [phone] PCP: Unice Cobble Patient Gender: Female PCP Fax : [phone] Patient DOB: 1935-12-02 Practice Name: [facility] Reason for Call: Caller: [name]/Patient; PCP: Unice Cobble; CB#: [phone]; Calling about possible bowel obstruction--passing liquid stool with lower abd and rectal pressure; poor appetite. Onset 10 days. Has tried Dulcolax, supp, mag citrate and fleets enema without effect. Had knee replacement on 05/20/14--was taking Oxycodone and now taking Tramadol. Wants to see PCP tomorrow or be referred to a GI specialist. Guideline: Constipation. Disposition: See Provider Within 24 Hours. Reason for Disposition: No bowel movement for one week and unresponsive to home care. Override Disposition: Call Provider Immediately. Reason for Override: Nursing Judgement Used. Contacted Dr.Panosh--she advised trying a suppository or going to ED if sxs are severe. Advised pt to go to ED but pt doesn't want to do that. Appt scheduled for tomorrow(06/16/14) @ 1115 with Dr.Jones. Home care advice given. Protocol(s) Used: Constipation Recommended Outcome per Protocol: See Provider within 24 hours Override Outcome if Used in Protocol: Call Provider Immediately RN Reason for Override Outcome: Nursing Judgement Used. Reason for Outcome: No bowel movement for one week and unresponsive to home care Care Advice: ~ Call provider if symptoms worsen or new symptoms develop. ~ Try a warm bath or apply a warm, wet towel to rectal area to help relax the muscles in the area. ~ SYMPTOM / CONDITION MANAGEMENT ~ CAUTIONS Constipation Care Measures: - Drink 8 to 10 glasses of liquid per day, more if breastfeeding. - Drink warm water or coffee early in the morning. - Gradually increase dietary fiber (fresh  fruits/vegetables, whole grain bread and cereals). - As tolerated, walk 30 minutes at a steady pace daily. - Consider nonprescription stool softeners (Colace) per label, pharmacist or provider recommendations. Stool softners are not habit forming as some stimulant laxatives may become. - Consider nonprescription bulk forming laxatives (such as Metamucil, FiberCon, Citrucel, etc.); follow package directions. - Avoid routine use of strong laxatives/enemas/suppositories unless ordered by provider. - Do not delay having bowel movement when having urge. - Keep a routine; attempt bowel movement within half hour after a meal or after some exercise. ~ 06/15/2014 6:31:53PM Page 1 of 1 [id number]

## 2014-06-17 NOTE — Telephone Encounter (Signed)
Pt called regarding ED visit as noted below. Called patient back and let her know that Dr. Marko Plume has reviewed CT and ED information. Pt states that CT has already called and has her scheduled for this Thursday 06/19/14 for scan. Let her know that the biopsy is set up for 06/24/14 at 9am. She needs to be in [facility] radiology department at 6:45, for biopsy, must have a driver, nothing to eat or drink after midnight and that she must stop her pradaxa after dose tonight (per radiology pradaxa has to be stopped for 4 days prior). Patient agreeable to this.    Also told pt that she can take the xanax as needed that was prescribed in the ED and to also try to drink 6-8 glasses of water each day. Pt states she has been trying her best today to drink fluids often and that she feels much better after getting there IVF in the ED. Told pt to call us with any questions or concerns. Pt very appreciative of the call back and agreeable to this.

## 2014-06-17 NOTE — Telephone Encounter (Signed)
Confirm appt d/t for 06/27/14. [facility] radiology will call with appt d/t.  Also gave radiology tel # to follow up with them if not heard anything from radiology.

## 2014-06-19 ENCOUNTER — Telehealth: Payer: Self-pay

## 2014-06-19 ENCOUNTER — Other Ambulatory Visit: Payer: Self-pay

## 2014-06-19 ENCOUNTER — Ambulatory Visit (HOSPITAL_COMMUNITY)
Admission: RE | Admit: 2014-06-19 | Discharge: 2014-06-19 | Disposition: A | Discharge status: Home / Self Care | Payer: Medicare Other | Source: Ambulatory Visit | Attending: Oncology | Admitting: Oncology

## 2014-06-19 ENCOUNTER — Encounter (HOSPITAL_COMMUNITY): Payer: Self-pay | Admitting: Pharmacy Technician

## 2014-06-19 DIAGNOSIS — M8448XA Pathological fracture, other site, initial encounter for fracture: Secondary | ICD-10-CM | POA: Diagnosis not present

## 2014-06-19 DIAGNOSIS — R918 Other nonspecific abnormal finding of lung field: Secondary | ICD-10-CM | POA: Diagnosis not present

## 2014-06-19 DIAGNOSIS — C55 Malignant neoplasm of uterus, part unspecified: Secondary | ICD-10-CM

## 2014-06-19 DIAGNOSIS — J8410 Pulmonary fibrosis, unspecified: Secondary | ICD-10-CM | POA: Diagnosis not present

## 2014-06-19 MED ORDER — ALPRAZOLAM 0.25 MG PO TABS: ORAL_TABLET | ORAL | Status: AC

## 2014-06-19 NOTE — Telephone Encounter (Signed)
          Patient Demographics     Patient Name Sex DOB SSN Address Phone    Amy Dominguez, Amy Dominguez Female Feb 22, 1936 [id number] [place] [address] [phone] (Home) [phone] (Mobile)              Message Received: Today     Lennis Marion Downer, MD Baruch Merl, RN            I cannot tell if reply to your note went thru - here it is again if not:  Ok for xanax 0.25 mg q 8-12 hrs prn anxiety #30  thanks

## 2014-06-19 NOTE — Telephone Encounter (Signed)
Ms. Torain came to [facility] after CT chest today to give Dr. Marko Plume Her daughter's phoone number to call her regarding why a biopsy is needed on her rib.  Daughter is an Materials engineer and mets to the bone with her type of cancer does not make sense. Told Ms. Lachapelle that Dr. Marko Plume is not in the office until 06-23-14.  Told her that Dr. Marko Plume ordered the biopsy as the ct scan cannot give a definitve diagnosis.  Dr. Marko Plume will not know anymore information until biopsy results are complete. Ms. Stehlin wanted her  Daughter's phone number attached  To her records so Dr. Marko Plume can call her at her convience to discuss her situation.  Told Ms. Froning that this information would be relayed to Dr. Marko Plume. Daughter is Dr. Virl Cagey Dragan cell number is [phone]. Ms. Melrose states that the xanax 0.25 mg is helping her not fall apart.  She is using it ~BID  She has 3 tabs left(Given 12 on 06-16-14)  She would like a new prescription if Dr. Marko Plume would give her one. Validated patient's concern of new findings and help her vent her anxiety.

## 2014-06-20 ENCOUNTER — Telehealth: Payer: Self-pay | Admitting: Oncology

## 2014-06-20 ENCOUNTER — Other Ambulatory Visit: Payer: Self-pay | Admitting: Radiology

## 2014-06-20 NOTE — Telephone Encounter (Signed)
Medical Oncology  At patient's request, I called daughter Eunice Blase, MD (pediatric oncologist) and discussed recent concerns at length, including findings on CT AP and CT chest, labs and planned CT biopsy (06-24-14) of soft tissue mass at area of rib destruction. She plans to come to [facility] for patient's visit with me on 10-30. At her request, I spoke with IR, who expect to use versed and fentanyl for the procedure. Per daughter, Mrs. Salser does have pain medication available tho she has not complained at all of discomfort in area of the rib findings. Patient has held pradaxa. Daughter tells me that the low dose xanax is helpful, tho she is concerned that patient not stay on this longer than necessary. I LM for daughter on same cell # re anticipated versed/ fentanyl, and also told her to call back to RN if other questions about meds prior to procedure. Daughter will let her mother know that we have spoken; I have not talked with Mrs. Warmack now.   Godfrey Pick, MD

## 2014-06-23 ENCOUNTER — Other Ambulatory Visit: Payer: Self-pay | Admitting: Radiology

## 2014-06-24 ENCOUNTER — Other Ambulatory Visit: Payer: Self-pay | Admitting: Oncology

## 2014-06-24 ENCOUNTER — Encounter (HOSPITAL_COMMUNITY): Payer: Self-pay

## 2014-06-24 ENCOUNTER — Ambulatory Visit (HOSPITAL_COMMUNITY)
Admission: RE | Admit: 2014-06-24 | Discharge: 2014-06-24 | Disposition: A | Discharge status: Home / Self Care | Payer: Medicare Other | Source: Ambulatory Visit | Attending: Oncology | Admitting: Oncology

## 2014-06-24 VITALS — BP 114/67 | HR 81 | Temp 98.0°F | Resp 16

## 2014-06-24 DIAGNOSIS — C7951 Secondary malignant neoplasm of bone: Secondary | ICD-10-CM | POA: Diagnosis not present

## 2014-06-24 DIAGNOSIS — Z950 Presence of cardiac pacemaker: Secondary | ICD-10-CM | POA: Diagnosis not present

## 2014-06-24 DIAGNOSIS — J8410 Pulmonary fibrosis, unspecified: Secondary | ICD-10-CM | POA: Diagnosis not present

## 2014-06-24 DIAGNOSIS — C55 Malignant neoplasm of uterus, part unspecified: Secondary | ICD-10-CM | POA: Diagnosis present

## 2014-06-24 DIAGNOSIS — I700 Atherosclerosis of aorta: Secondary | ICD-10-CM | POA: Insufficient documentation

## 2014-06-24 DIAGNOSIS — M419 Scoliosis, unspecified: Secondary | ICD-10-CM | POA: Diagnosis not present

## 2014-06-24 DIAGNOSIS — I10 Essential (primary) hypertension: Secondary | ICD-10-CM | POA: Diagnosis not present

## 2014-06-24 DIAGNOSIS — Q6102 Congenital multiple renal cysts: Secondary | ICD-10-CM | POA: Diagnosis not present

## 2014-06-24 DIAGNOSIS — I2510 Atherosclerotic heart disease of native coronary artery without angina pectoris: Secondary | ICD-10-CM | POA: Insufficient documentation

## 2014-06-24 DIAGNOSIS — Z7901 Long term (current) use of anticoagulants: Secondary | ICD-10-CM | POA: Insufficient documentation

## 2014-06-24 DIAGNOSIS — Z87891 Personal history of nicotine dependence: Secondary | ICD-10-CM | POA: Diagnosis not present

## 2014-06-24 DIAGNOSIS — R918 Other nonspecific abnormal finding of lung field: Secondary | ICD-10-CM | POA: Insufficient documentation

## 2014-06-24 DIAGNOSIS — J45909 Unspecified asthma, uncomplicated: Secondary | ICD-10-CM | POA: Insufficient documentation

## 2014-06-24 DIAGNOSIS — Z90710 Acquired absence of both cervix and uterus: Secondary | ICD-10-CM | POA: Diagnosis not present

## 2014-06-24 DIAGNOSIS — M8448XA Pathological fracture, other site, initial encounter for fracture: Secondary | ICD-10-CM | POA: Insufficient documentation

## 2014-06-24 DIAGNOSIS — I4891 Unspecified atrial fibrillation: Secondary | ICD-10-CM | POA: Diagnosis not present

## 2014-06-24 DIAGNOSIS — Z23 Encounter for immunization: Secondary | ICD-10-CM

## 2014-06-24 LAB — CBC WITH DIFFERENTIAL/PLATELET
BASOS PCT: 1 % (ref 0–1)
Basophils Absolute: 0 10*3/uL (ref 0.0–0.1)
Eosinophils Absolute: 0.1 10*3/uL (ref 0.0–0.7)
Eosinophils Relative: 2 % (ref 0–5)
HCT: 33.6 % — ABNORMAL LOW (ref 36.0–46.0)
HEMOGLOBIN: 10.8 g/dL — AB (ref 12.0–15.0)
Lymphocytes Relative: 24 % (ref 12–46)
Lymphs Abs: 1.6 10*3/uL (ref 0.7–4.0)
MCH: 28.6 pg (ref 26.0–34.0)
MCHC: 32.1 g/dL (ref 30.0–36.0)
MCV: 89.1 fL (ref 78.0–100.0)
MONOS PCT: 11 % (ref 3–12)
Monocytes Absolute: 0.7 10*3/uL (ref 0.1–1.0)
NEUTROS ABS: 4.2 10*3/uL (ref 1.7–7.7)
NEUTROS PCT: 62 % (ref 43–77)
Platelets: 188 10*3/uL (ref 150–400)
RBC: 3.77 MIL/uL — ABNORMAL LOW (ref 3.87–5.11)
RDW: 13.9 % (ref 11.5–15.5)
WBC: 6.7 10*3/uL (ref 4.0–10.5)

## 2014-06-24 LAB — APTT: APTT: 30 s (ref 24–37)

## 2014-06-24 LAB — PROTIME-INR
INR: 1.12 (ref 0.00–1.49)
PROTHROMBIN TIME: 14.6 s (ref 11.6–15.2)

## 2014-06-24 MED ORDER — FENTANYL CITRATE 0.05 MG/ML IJ SOLN
INTRAMUSCULAR | Status: AC | PRN
  Administered 2014-06-24 (×2): 50 ug via INTRAVENOUS

## 2014-06-24 MED ORDER — INFLUENZA VAC SPLIT QUAD 0.5 ML IM SUSY
0.5000 mL | PREFILLED_SYRINGE | INTRAMUSCULAR | Status: DC
  Filled 2014-06-24: qty 0.5

## 2014-06-24 MED ORDER — MIDAZOLAM HCL 2 MG/2ML IJ SOLN
INTRAMUSCULAR | Status: AC
  Filled 2014-06-24: qty 4

## 2014-06-24 MED ORDER — SODIUM CHLORIDE 0.9 % IV SOLN
INTRAVENOUS | Status: DC
  Administered 2014-06-24: 08:00:00 via INTRAVENOUS

## 2014-06-24 MED ORDER — FENTANYL CITRATE 0.05 MG/ML IJ SOLN
INTRAMUSCULAR | Status: AC
  Filled 2014-06-24: qty 4

## 2014-06-24 MED ORDER — MIDAZOLAM HCL 2 MG/2ML IJ SOLN
INTRAMUSCULAR | Status: AC | PRN
  Administered 2014-06-24: 2 mg via INTRAVENOUS

## 2014-06-24 MED ORDER — IOHEXOL 300 MG/ML  SOLN: 100.0000 mL | Freq: Once | INTRAMUSCULAR | Status: AC | PRN

## 2014-06-24 NOTE — Discharge Instructions (Signed)
Biopsy Care After  Call [facility] Radiology at [phone] for pain not relieved by Tylenol, profuse bleeding, fever 101 or greater.May remove badaid in 24 hour and bathe as usual. Quiet rest remainder of day no change in diet or medications.     Refer to this sheet in the next few weeks. These instructions provide you with information on caring for yourself after your procedure. Your caregiver may also give you more specific instructions. Your treatment has been planned according to current medical practices, but problems sometimes occur. Call your caregiver if you have any problems or questions after your procedure. If you had a fine needle biopsy, you may have soreness at the biopsy site for 1 to 2 days. If you had an open biopsy, you may have soreness at the biopsy site for 3 to 4 days. HOME CARE INSTRUCTIONS   You may resume normal diet and activities as directed.  Change bandages (dressings) as directed. If your wound was closed with a skin glue (adhesive), it will wear off and begin to peel in 7 days.  Only take over-the-counter or prescription medicines for pain, discomfort, or fever as directed by your caregiver.  Ask your caregiver when you can bathe and get your wound wet. SEEK IMMEDIATE MEDICAL CARE IF:   You have increased bleeding (more than a small spot) from the biopsy site.  You notice redness, swelling, or increasing pain at the biopsy site.  You have pus coming from the biopsy site.  You have a fever.  You notice a bad smell coming from the biopsy site or dressing.  You have a rash, have difficulty breathing, or have any allergic problems. MAKE SURE YOU:   Understand these instructions.  Will watch your condition.  Will get help right away if you are not doing well or get worse. Document Released: 03/04/2005 Document Revised: 11/07/2011 Document Reviewed: 02/10/2011 [facility] Patient Information 2015 [place], [place]. This information is not intended to replace  advice given to you by your health care provider. Make sure you discuss any questions you have with your health care provider. Conscious Sedation, Adult, Care After Refer to this sheet in the next few weeks. These instructions provide you with information on caring for yourself after your procedure. Your health care provider may also give you more specific instructions. Your treatment has been planned according to current medical practices, but problems sometimes occur. Call your health care provider if you have any problems or questions after your procedure. WHAT TO EXPECT AFTER THE PROCEDURE  After your procedure:  You may feel sleepy, clumsy, and have poor balance for several hours.  Vomiting may occur if you eat too soon after the procedure. HOME CARE INSTRUCTIONS  Do not participate in any activities where you could become injured for at least 24 hours. Do not:  Drive.  Swim.  Ride a bicycle.  Operate heavy machinery.  Cook.  Use power tools.  Climb ladders.  Work from a high place.  Do not make important decisions or sign legal documents until you are improved.  If you vomit, drink water, juice, or soup when you can drink without vomiting. Make sure you have little or no nausea before eating solid foods.  Only take over-the-counter or prescription medicines for pain, discomfort, or fever as directed by your health care provider.  Make sure you and your family fully understand everything about the medicines given to you, including what side effects may occur.  You should not drink alcohol, take sleeping  pills, or take medicines that cause drowsiness for at least 24 hours.  If you smoke, do not smoke without supervision.  If you are feeling better, you may resume normal activities 24 hours after you were sedated.  Keep all appointments with your health care provider. SEEK MEDICAL CARE IF:  Your skin is pale or bluish in color.  You continue to feel nauseous or  vomit.  Your pain is getting worse and is not helped by medicine.  You have bleeding or swelling.  You are still sleepy or feeling clumsy after 24 hours. SEEK IMMEDIATE MEDICAL CARE IF:  You develop a rash.  You have difficulty breathing.  You develop any type of allergic problem.  You have a fever. MAKE SURE YOU:  Understand these instructions.  Will watch your condition.  Will get help right away if you are not doing well or get worse. Document Released: 06/05/2013 Document Reviewed: 06/05/2013 [facility] Patient Information 2015 [place], [place]. This information is not intended to replace advice given to you by your health care provider. Make sure you discuss any questions you have with your health care provider.

## 2014-06-24 NOTE — H&P (Signed)
Chief Complaint: "I'm having a biopsy"  Referring Physician(s): Livesay,Lennis P  History of Present Illness: Amy Dominguez is a 78 y.o. female with history of uterine leiomyosarcoma 2013 and recent CT revealing soft tissue mass involving right posterior 11th rib and lytic lesion of right lateral 9th rib. She presents today for  CT guided biopsy of the right posterior 11th rib ST mass.  Past Medical History  Diagnosis Date  . Spinal stenosis     spinal stenosis LS spine  . Hypertension   . Asthma 2008 , 2012    RAD after BRONCHITIS    . Headache(784.0)     allergy related  . Symptomatic bradycardia 2011    Insertion of Medtronic Pacer  . 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 Arthritis     hands, feet, hips, knees  . Dysrhythmia     atrial fib-Medtronic Pacemaker-on pradaxa-off for surgery since 9.25 2013  . Complication of anesthesia     slow to wake up before having pacemaker  . Pacemaker     Past Surgical History  Procedure Laterality Date  . Polypectomy  2008     negative 2011,Dr Sharlett Iles  . Hemorrhoid surgery    . Tonsillectomy    . Tubal ligation    . Insert / replace / remove pacemaker  [facility] pacemaker  09/2009  . Foot surgery      JOINT REPLACEMENT LT GREAT TOE   20 YRS AGO   . Cataract extraction      OS  . Lumbar laminectomy/decompression microdiscectomy  09/21/2011    Procedure: LUMBAR LAMINECTOMY/DECOMPRESSION MICRODISCECTOMY;  Surgeon: Eustace Moore, MD;  Location: [place] [facility];  Service: Neurosurgery;  Laterality: N/A;  lumbar laminectomy lumbar four-lumbar five  . Hysteroscopy w/d&c  05/29/2012    Procedure: DILATATION AND CURETTAGE /HYSTEROSCOPY;  Surgeon: Azalia Bilis, MD;  Location: [place] [facility];  Service: Gynecology;  Laterality: N/A;  W/ Resectascope  . Dilation and evacuation  05/29/2012    Procedure: DILATATION AND EVACUATION;  Surgeon: Azalia Bilis, MD;  Location: [place] [facility];  Service: Gynecology;  Laterality: N/A;  .  Abdominal hysterectomy    . Back surgery    . Total knee arthroplasty Right 05/19/2014    Procedure: RIGHT TOTAL KNEE ARTHROPLASTY;  Surgeon: Gearlean Alf, MD;  Location: [facility];  Service: Orthopedics;  Laterality: Right;    Allergies: Oxycodone and Tylenol  Medications: Prior to Admission medications   Medication Sig Start Date End Date Taking? Authorizing Provider  ALPRAZolam Duanne Moron) 0.25 MG tablet Take 1 tablet every 8-12 hrs prn anxiety 06/19/14  Yes Lennis Marion Downer, MD  carisoprodol (SOMA) 350 MG tablet Take 350 mg by mouth 4 (four) times daily as needed for muscle spasms.   Yes Historical Provider, MD  cetirizine (ZYRTEC) 10 MG tablet Take 10 mg by mouth at bedtime.   Yes Historical Provider, MD  furosemide (LASIX) 20 MG tablet Take 20 mg by mouth every morning.   Yes Historical Provider, MD  lisinopril (PRINIVIL,ZESTRIL) 20 MG tablet Take 20 mg by mouth at bedtime.   Yes Historical Provider, MD  Multiple Vitamin (MULTIVITAMIN WITH MINERALS) TABS tablet Take 1 tablet by mouth every morning.   Yes Historical Provider, MD  Naproxen Sodium (ALEVE) 220 MG CAPS Take 220 mg by mouth daily as needed (pain.).   Yes Historical Provider, MD  potassium chloride SA (K-DUR,KLOR-CON) 20 MEQ tablet Take 20 mEq by mouth every morning.  Yes Historical Provider, MD  torsemide (DEMADEX) 20 MG tablet Take 20 mg by mouth every morning.   Yes Historical Provider, MD  VITAMIN E PO Take 1 tablet by mouth every morning.   Yes Historical Provider, MD  zolpidem (AMBIEN) 10 MG tablet Take 10 mg by mouth at bedtime as needed for sleep.   Yes Historical Provider, MD  ibuprofen (ADVIL,MOTRIN) 200 MG tablet Take 400 mg by mouth every 6 (six) hours as needed for mild pain or moderate pain.    Historical Provider, MD  PRADAXA 150 MG CAPS capsule Take 1 capsule by mouth 2 (two) times daily. 05/10/14   Historical Provider, MD    Family History  Problem Relation Age of Onset  . Stroke Mother 75    post valve  replacement; warfarin missed  . Heart attack Father 40  . Breast cancer Sister   . Deep vein thrombosis Daughter     Factor S non functional  . Diabetes Neg Hx     History   Social History  . Marital Status: Married    Spouse Name: N/A    Number of Children: N/A  . Years of Education: N/A   Occupational History  . Retired Therapist, sports    Social History Main Topics  . Smoking status: Former Smoker -- 0.25 packs/day for 54 years    Types: Cigarettes    Quit date: 06/05/2013  . Smokeless tobacco: Never Used     Comment: smoked 1957-2014, up to < 1 ppd (off 5 years )  . Alcohol Use: 1.8 oz/week    3 Glasses of wine per week     Comment:  occasionally  . Drug Use: No  . Sexual Activity: Not Currently   Other Topics Concern  . None   Social History Narrative   No added salt              Review of Systems  Constitutional: Negative for fever and chills.  Respiratory: Negative for cough and shortness of breath.   Cardiovascular: Negative for chest pain.  Gastrointestinal: Negative for nausea, vomiting, abdominal pain and blood in stool.  Genitourinary: Negative for dysuria and hematuria.  Musculoskeletal: Positive for joint swelling. Negative for back pain.       Rt knee pain  Neurological: Negative for headaches.  Hematological: Does not bruise/bleed easily.  Psychiatric/Behavioral: The patient is nervous/anxious.     Vital Signs: BP 125/51  Pulse 77  Temp(Src) 98.1 F (36.7 C) (Oral)  Resp 18  SpO2 95%  LMP 09/14/2011  Physical Exam  Constitutional: She is oriented to person, place, and time. She appears well-developed and well-nourished.  Cardiovascular: Normal rate.   Murmur heard. Paced rhythm  Pulmonary/Chest: Effort normal and breath sounds normal.  Abdominal: Soft. Bowel sounds are normal. There is no tenderness.  Musculoskeletal: Normal range of motion. She exhibits no edema.  Neurological: She is alert and oriented to person, place, and time.     Imaging: Ct Chest Wo Contrast  06/19/2014   CLINICAL DATA:  Uterine leiomyosarcoma diagnosed 2013, pathologic rib fractures suspected on prior CT abdomen pelvis  EXAM: CT CHEST WITHOUT CONTRAST  TECHNIQUE: Multidetector CT imaging of the chest was performed following the standard protocol without IV contrast.  COMPARISON:  Partial comparison to CT abdomen pelvis dated 06/16/2014. CT chest abdomen pelvis dated 11/04/2013  FINDINGS: 5 mm left lower lobe pulmonary nodule (series 5/ image 39), unchanged. Adjacent 2 mm nodule in the left lower lobe (series 11/ image 40). Two  additional 1-2 mm subpleural nodules in the left upper lobe (series 5/images 8-9) unchanged. 2-3 mm calcified granuloma in the left lung apex (series 5/image 7), benign.  No new/suspicious pulmonary nodules.  Visualized thyroid is unremarkable.  The heart is top-normal in size. No pericardial effusion. Coronary atherosclerosis. Atherosclerotic calcifications of the aortic arch.  Small mediastinal lymph nodes measuring up to 8 mm short axis (series 2/image 18), which does not meet pathologic CT size criteria. No suspicious axillary lymphadenopathy.  Left subclavian pacemaker.  Visualized upper abdomen is unchanged from recent CT.  2.2 x 3.2 cm soft tissue mass with destruction of the right posterior 11th rib (series 2/image 55). 10 mm lytic lesion with adjacent pathologic fracture involving in the right lateral 9th rib (series 7/images 64 and 68).  Mild degenerative changes of the visualized thoracolumbar spine.  IMPRESSION: Stable 3.2 cm soft tissue lesion involving the right posterior 11th rib, suspicious for metastasis versus myeloma.  Additional lytic lesion with pathologic fracture involving the right lateral 9th rib, suspicious for metastasis versus myeloma.  Stable left upper and lower lobe pulmonary nodules, unchanged, favored to be benign.   Electronically Signed   By: Julian Hy M.D.   On: 06/19/2014 14:47   Ct Abdomen  Pelvis W Contrast  06/16/2014   CLINICAL DATA:  Intermittent diarrhea/constipation for 12 days, on opiates for knee surgery  EXAM: CT ABDOMEN AND PELVIS WITH CONTRAST  TECHNIQUE: Multidetector CT imaging of the abdomen and pelvis was performed using the standard protocol following bolus administration of intravenous contrast.  CONTRAST:  28mL OMNIPAQUE IOHEXOL 300 MG/ML  SOLN  COMPARISON:  11/04/2013  FINDINGS: Lower chest:  Lung bases are clear.  Pacemaker leads, incompletely visualized.  Hepatobiliary: Liver is within normal limits.  Gallbladder is unremarkable. No intrahepatic or extrahepatic ductal dilatation.  Pancreas: Within normal limits.  Spleen: Within normal limits.  Adrenals/Urinary Tract: Adrenal glands are unremarkable.  3.2 x 2.6 cm medial right upper pole renal cyst (series 2/ image 29). Tiny left renal cyst measuring up to 7 mm (series 3/image 18). No hydronephrosis.  Stomach/Bowel: Stomach is unremarkable.  No evidence of bowel obstruction.  Normal appendix (coronal image 45).  Vascular/Lymphatic: Atherosclerotic calcifications of the abdominal aorta and branch vessels.  No suspicious abdominopelvic lymphadenopathy.  Reproductive: Status post hysterectomy.  No adnexal masses.  Other: No suspicious abdominopelvic lymphadenopathy.  Musculoskeletal: Degenerative changes of the visualized thoracolumbar spine.  9 mm lytic lesion involving the right lateral 10th rib (series 2/image 10) with associated pathologic fracture (series 2/image 12).  2.4 x 3.5 cm soft tissue lesion with associated destruction of the right posterior 12th rib (series 2/ image 23), new from prior CT.  IMPRESSION: No evidence of bowel obstruction.  Normal appendix.  No CT findings to account for the patient's diarrhea/ constipation.  2.5 cm destructive soft tissue lesion involving the right posterior 12th rib. Additional 9 mm lytic lesion with pathologic fracture involving the right lateral 10th rib. These findings are new from  prior CT and are suspicious for myeloma or metastases.   Electronically Signed   By: Julian Hy M.D.   On: 06/16/2014 19:28   Dg Abd Acute W/chest  06/16/2014   CLINICAL DATA:  Lower abdomen pain for 12 days.  Nausea.  EXAM: ACUTE ABDOMEN SERIES (ABDOMEN 2 VIEW & CHEST 1 VIEW)  COMPARISON:  None.  FINDINGS: There is no evidence of dilated bowel loops or free intraperitoneal air. No radiopaque calculi or other significant radiographic abnormality is seen. There  are degenerative joint changes and scoliosis of spine. Heart size and mediastinal contours are within normal limits. Cardiac pacemaker is noted. Both lungs are clear.  IMPRESSION: Negative abdominal radiographs.  No acute cardiopulmonary disease.   Electronically Signed   By: Abelardo Diesel M.D.   On: 06/16/2014 12:23    Labs:  CBC:  Recent Labs  05/21/14 0510 06/04/14 0934 06/16/14 1221 06/16/14 1805 06/24/14 0725  WBC 15.5* 8.8 12.9*  --  6.7  HGB 9.1* 11.1* 13.0 13.6 10.8*  HCT 27.8* 34.7* 39.6 40.0 33.6*  PLT 145* 343 258.0  --  188    COAGS:  Recent Labs  01/14/14 1250 05/14/14 1035 06/24/14 0725  INR 0.96 1.01 1.12  APTT 30 29 30     BMP:  Recent Labs  05/14/14 1035 05/20/14 0425 05/21/14 0510 06/04/14 0934 06/16/14 1221 06/16/14 1805  NA 139 137 137 141 135 133*  K 4.3 4.0 4.3 3.6 3.9 4.0  CL 101 100 99  --  96 98  CO2 27 27 27 28 26   --   GLUCOSE 95 146* 160* 121 110* 104*  BUN 19 19 18  16.0 21 26*  CALCIUM 9.9 8.3* 8.8 10.0 9.6  --   CREATININE 0.84 0.78 0.69 0.8 1.5* 1.50*  GFRNONAA 66* 79* 82*  --   --   --   GFRAA 76* >90 >90  --   --   --     LIVER FUNCTION TESTS:  Recent Labs  11/14/13 1424 05/14/14 1035 06/04/14 0934 06/16/14 1221  BILITOT 0.46 0.5 0.93 1.0  AST 18 18 16 27   ALT 7 6 6 10   ALKPHOS 93 82 84 97  PROT 6.7 7.2 6.8 7.5  ALBUMIN 3.7 4.1 3.6 3.9    TUMOR MARKERS: No results found for this basename: AFPTM, CEA, CA199, CHROMGRNA,  in the last 8760  hours  Assessment and Plan: Amy Dominguez is a 78 y.o. female with history of uterine leiomyosarcoma 2013 and recent CT revealing soft tissue mass involving right posterior 11th rib and lytic lesion of right lateral 9th rib. She presents today for  CT guided biopsy of the right posterior 11th rib ST mass. Details/risks of procedure d/w pt/husband with their understanding and consent.          Signed: Autumn Messing 06/24/2014, 8:35 AM

## 2014-06-24 NOTE — Progress Notes (Signed)
Patient in short stay today for biopsy in interventional radiology. Order for flu shot was previously in EPIC from Dr. Marko Plume. She is to receive shot when she has appt with Dr. Marko Plume this coming Friday. Order was accidentally released today in computer with IR orders and unable to modify. Called Natro LPN at Dr. Mariana Kaufman office and she will have ordered reentered for patient to receive flu shot this Friday at her [place] appt.

## 2014-06-24 NOTE — Procedures (Signed)
R 11th rib Bx 18 g core times 2 No comp

## 2014-06-27 ENCOUNTER — Ambulatory Visit (HOSPITAL_BASED_OUTPATIENT_CLINIC_OR_DEPARTMENT_OTHER): Payer: Medicare Other | Admitting: Oncology

## 2014-06-27 ENCOUNTER — Other Ambulatory Visit (HOSPITAL_BASED_OUTPATIENT_CLINIC_OR_DEPARTMENT_OTHER): Payer: Medicare Other

## 2014-06-27 ENCOUNTER — Encounter: Payer: Self-pay | Admitting: Oncology

## 2014-06-27 ENCOUNTER — Other Ambulatory Visit: Payer: Self-pay | Admitting: Oncology

## 2014-06-27 VITALS — BP 138/65 | HR 56 | Temp 97.8°F | Resp 18 | Ht 62.0 in | Wt 169.4 lb

## 2014-06-27 DIAGNOSIS — M8448XA Pathological fracture, other site, initial encounter for fracture: Secondary | ICD-10-CM

## 2014-06-27 DIAGNOSIS — C55 Malignant neoplasm of uterus, part unspecified: Secondary | ICD-10-CM

## 2014-06-27 DIAGNOSIS — Z8542 Personal history of malignant neoplasm of other parts of uterus: Secondary | ICD-10-CM

## 2014-06-27 DIAGNOSIS — C7951 Secondary malignant neoplasm of bone: Secondary | ICD-10-CM

## 2014-06-27 LAB — CBC WITH DIFFERENTIAL/PLATELET
BASO%: 1.3 % (ref 0.0–2.0)
BASOS ABS: 0.1 10*3/uL (ref 0.0–0.1)
EOS%: 0.5 % (ref 0.0–7.0)
Eosinophils Absolute: 0.1 10*3/uL (ref 0.0–0.5)
HCT: 37.4 % (ref 34.8–46.6)
HEMOGLOBIN: 12 g/dL (ref 11.6–15.9)
LYMPH%: 25.2 % (ref 14.0–49.7)
MCH: 28.3 pg (ref 25.1–34.0)
MCHC: 32.1 g/dL (ref 31.5–36.0)
MCV: 88.1 fL (ref 79.5–101.0)
MONO#: 0.9 10*3/uL (ref 0.1–0.9)
MONO%: 8.1 % (ref 0.0–14.0)
NEUT#: 7 10*3/uL — ABNORMAL HIGH (ref 1.5–6.5)
NEUT%: 64.9 % (ref 38.4–76.8)
Platelets: 224 10*3/uL (ref 145–400)
RBC: 4.25 10*6/uL (ref 3.70–5.45)
RDW: 14.4 % (ref 11.2–14.5)
WBC: 10.8 10*3/uL — ABNORMAL HIGH (ref 3.9–10.3)
lymph#: 2.7 10*3/uL (ref 0.9–3.3)

## 2014-06-27 LAB — COMPREHENSIVE METABOLIC PANEL (CC13)
ALK PHOS: 91 U/L (ref 40–150)
ALT: 7 U/L (ref 0–55)
AST: 16 U/L (ref 5–34)
Albumin: 3.8 g/dL (ref 3.5–5.0)
Anion Gap: 10 mEq/L (ref 3–11)
BUN: 15.3 mg/dL (ref 7.0–26.0)
CO2: 27 mEq/L (ref 22–29)
CREATININE: 0.8 mg/dL (ref 0.6–1.1)
Calcium: 9.6 mg/dL (ref 8.4–10.4)
Chloride: 102 mEq/L (ref 98–109)
Glucose: 140 mg/dl (ref 70–140)
Potassium: 3.7 mEq/L (ref 3.5–5.1)
Sodium: 140 mEq/L (ref 136–145)
Total Bilirubin: 0.59 mg/dL (ref 0.20–1.20)
Total Protein: 6.7 g/dL (ref 6.4–8.3)

## 2014-06-27 MED ORDER — ZOLPIDEM TARTRATE 10 MG PO TABS: 10.0000 mg | ORAL_TABLET | Freq: Every evening | ORAL | Status: DC | PRN

## 2014-06-27 NOTE — Progress Notes (Signed)
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 She had CT guided biopsy by IR of soft tissue mass at right posterior 11th rib on 06-24-14, pathology ([id number]) confirming metastatic leiomyosarcoma.   Patient has had no discomfort in right chest/ ribs, the areas of concern identified on upper cuts of CT AP done with visit ED 06-16-14 for GI symptoms and dehydration; the CT AP did not show other findings of concern. She had CT chest 06-24-14 with soft tissue mass and pathologic fracture right 11th rib and pathologic fracture with smaller soft tissue changes at right 9th rib, no change in small lung nodules seen on past scans. She has not had PET. She tolerated the biopsy without difficulty. She has understandably been very anxious with this evaluation in process, tho she is reluctant to take medication including xanax because of complications from pain meds after recent right knee surgery.   She twisted right knee within past week, saw Dr Maureen Ralphs on 06-24-14 (after the biopsy), no significant problems from surgical standpoint.  REVIEW OF SYSTEMS: Appetite better until events of this week. No SOB or cough. No fever or symptoms of infection. Bowels moving regularly now. No bleeding. No other new or different pain. No swelling LE. No cardiac symptoms.  Remainder of 10 point Review of Systems negative/ unchanged.  She had PAC for adjuvant chemotherapy, subsequently removed She has not had flu vaccine.  Patient and husband have planned to move back to [place] area, expecting their house to be completed there in next few  weeks. They request referral to [facility] and prefer not to begin any treatment in [place] prior to move.   ONCOLOGIC HISTORY   Leiomyosarcoma of uterus   06/15/2012 Initial Diagnosis Leiomyosarcoma of uterus, IA s/p TRH, BSO.   08/06/2012 - 10/22/2012 Chemotherapy 4 cycles of taxotere and gemictabine  Patient had vaginal bleeding Sept/ early Oct 2013. She was seen by gyn within 2-3 days, with ultrasound done, then [facility] with hysteroscopic myomectomy and polypectomy by Dr Charlies Constable on 05-29-12, path [id number] with high grade leiomyosarcoma with >10 mitoses/ 10 HPF, 3.5 cm into outer half of myometrium, no LVSI. This pathology was confirmed at [facility]. She was referred to Dr Nancy Marus, had CT CAP done in [facility] 06-12-12 with 2 indeterminate left lung nodules largest 6 mm and 3 cm posterior uterine myometrial mass without apparent extrauterine extension or metastatic disease. She had robotic assisted laparoscopic hysterectomy, bilateral salpingo-oophorectomy 06/15/12 at [facility] by Dr Alycia Rossetti with findings of physiologic adhesions of right adnexum to appendix; small fibroid noted on uterus, otherwise normal abdominal survey; no evidence of extrauterine disease on evaluation. Pathology from the [facility] surgery [phone]) had high grade leiomyosarcoma with > 20 mitoses/10 HPF and invasion into outer half of myometrium, negative LVSI and benign ovaries/tubes. Gyn oncology recommended gemzar/ taxotere off study due to her high risk pathology. 4 cycles of gemzar/taxotere were given from 08-06-12 thru 10-22-12, with dose reduction of gemzar due to skin toxicity and cytopenias. CT CAP 11-04-13 did not have findings of concern, however CT AP 06-16-14 showed right lower rib abnormalities confirmed by CT chest 06-24-14, with CT needle biopsy 06-24-14 documenting metastatic leiomyosarcoma.  Objective:  Vital signs in last 24 hours:  BP 138/65  Pulse 56  Temp(Src) 97.8 F (36.6 C) (Oral)  Resp 18   Ht 5\' 2"  (1.575 m)  Wt 169 lb 6.4 oz (76.839 kg)  BMI 30.98 kg/m2  LMP 09/14/2011  Weight is down 2 lbs from 06-04-14.  Alert, oriented and appropriate. Ambulatory with cane due to recent right knee surgery, and using WC for office halls. Changes position easily in chair. Respirations not labored RA. Family very supportive  HEENT:PERRL, sclerae not icteric. Oral mucosa moist.  Neck supple. No JVD.  Lymphatics:no suraclavicular adenopathy Resp: clear to auscultation bilaterally  Cardio: regular rate and rhythm. . GI: soft, nontender, not distended. Normally active bowel sounds.  Musculoskeletal/ Extremities: LE without pitting edema. Lower right ribs posteriorly not tender to palpation, cannot appreciate mass Skin: area of biopsy right posterior rib without bruising   Lab Results:  Results for orders placed in visit on 06/27/14  CBC WITH DIFFERENTIAL      Result Value Ref Range   WBC 10.8 (*) 3.9 - 10.3 10e3/uL   NEUT# 7.0 (*) 1.5 - 6.5 10e3/uL   HGB 12.0  11.6 - 15.9 g/dL   HCT 37.4  34.8 - 46.6 %   Platelets 224  145 - 400 10e3/uL   MCV 88.1  79.5 - 101.0 fL   MCH 28.3  25.1 - 34.0 pg   MCHC 32.1  31.5 - 36.0 g/dL   RBC 4.25  3.70 - 5.45 10e6/uL   RDW 14.4  11.2 - 14.5 %   lymph# 2.7  0.9 - 3.3 10e3/uL   MONO# 0.9  0.1 - 0.9 10e3/uL   Eosinophils Absolute 0.1  0.0 - 0.5 10e3/uL   Basophils Absolute 0.1  0.0 - 0.1 10e3/uL   NEUT% 64.9  38.4 - 76.8 %   LYMPH% 25.2  14.0 - 49.7 %   MONO% 8.1  0.0 - 14.0 %   EOS% 0.5  0.0 - 7.0 %   BASO% 1.3  0.0 - 2.0 %  COMPREHENSIVE METABOLIC PANEL (JS28)      Result Value Ref Range   Sodium 140  136 - 145 mEq/L   Potassium 3.7  3.5 - 5.1 mEq/L   Chloride 102  98 - 109 mEq/L   CO2 27  22 - 29 mEq/L   Glucose 140  70 - 140 mg/dl   BUN 15.3  7.0 - 26.0 mg/dL   Creatinine 0.8  0.6 - 1.1 mg/dL   Total Bilirubin 0.59  0.20 - 1.20 mg/dL   Alkaline Phosphatase 91  40 - 150 U/L   AST 16  5 - 34 U/L   ALT 7  0 - 55 U/L   Total Protein  6.7  6.4 - 8.3 g/dL   Albumin 3.8  3.5 - 5.0 g/dL   Calcium 9.6  8.4 - 10.4 mg/dL   Anion Gap 10  3 - 11 mEq/L     Studies/Results:  I have reviewed PACs images with patient and family, and have given daughter copies of imaging reports and pathology report.  CT-GUIDED BIOPSY RIGHT ELEVENTH RIB BIOPSY. 06-24-14  MEDICATIONS AND MEDICAL HISTORY: Versed 2 mg, Fentanyl 100 mcg.  Additional Medications: None.  ANESTHESIA/SEDATION: Moderate sedation time: 10 minutes  PROCEDURE: The procedure, risks, benefits, and alternatives were explained to the patient. Questions regarding the procedure were encouraged and answered. The patient understands and consents to the procedure.  The back was prepped with Betadine in a sterile fashion,  and a sterile drape was applied covering the operative field. A sterile gown and sterile gloves were used for the procedure.  Under CT guidance, a(n) 17 gauge guide needle was advanced into the right eleventh rib lesion. Subsequently two 18 gauge core biopsies were obtained. The guide needle was removed. Final imaging was performed.  Patient tolerated the procedure well without complication. Vital sign monitoring by nursing staff during the procedure will continue as patient is in the special procedures unit for post procedure observation.  FINDINGS: The images document guide needle placement within the right eleventh rib lesion. Post biopsy images demonstrate no hematoma.  IMPRESSION: Successful CT-guided core biopsy of a right eleventh rib lesion.        ADDENDUM REPORT: 06/24/2014 11:46  ADDENDUM: Addendum for further clarification.  The posterior right rib lesions are unchanged from recent CT abdomen/pelvis but new from prior March CT chest.  Additionally, the numbering on the current CT chest study (9th and 11th ribs) are correct, as this was numbered starting at T1. The patient only has vestigial ribs at T12, accounting  for the numbering discrepancy on recent CT abdomen/pelvis.   Electronically Signed  By: Julian Hy M.D.  On: 06/24/2014 11:46      Study Result     CLINICAL DATA: Uterine leiomyosarcoma diagnosed 2013, pathologic rib fractures suspected on prior CT abdomen pelvis  EXAM: CT CHEST WITHOUT CONTRAST  TECHNIQUE: Multidetector CT imaging of the chest was performed following the standard protocol without IV contrast.  COMPARISON: Partial comparison to CT abdomen pelvis dated 06/16/2014. CT chest abdomen pelvis dated 11/04/2013  FINDINGS: 5 mm left lower lobe pulmonary nodule (series 5/ image 39), unchanged. Adjacent 2 mm nodule in the left lower lobe (series 11/ image 40). Two additional 1-2 mm subpleural nodules in the left upper lobe (series 5/images 8-9) unchanged. 2-3 mm calcified granuloma in the left lung apex (series 5/image 7), benign.  No new/suspicious pulmonary nodules.  Visualized thyroid is unremarkable.  The heart is top-normal in size. No pericardial effusion. Coronary atherosclerosis. Atherosclerotic calcifications of the aortic arch.  Small mediastinal lymph nodes measuring up to 8 mm short axis (series 2/image 18), which does not meet pathologic CT size criteria. No suspicious axillary lymphadenopathy.  Left subclavian pacemaker.  Visualized upper abdomen is unchanged from recent CT.  2.2 x 3.2 cm soft tissue mass with destruction of the right posterior 11th rib (series 2/image 55). 10 mm lytic lesion with adjacent pathologic fracture involving in the right lateral 9th rib (series 7/images 64 and 68).  Mild degenerative changes of the visualized thoracolumbar spine.  IMPRESSION: Stable 3.2 cm soft tissue lesion involving the right posterior 11th rib, suspicious for metastasis versus myeloma.  Additional lytic lesion with pathologic fracture involving the right lateral 9th rib, suspicious for metastasis versus  myeloma.  Stable left upper and lower lobe pulmonary nodules, unchanged, favored to be benign.       CT ABDOMEN AND PELVIS WITH CONTRAST   06-16-14 TECHNIQUE: Multidetector CT imaging of the abdomen and pelvis was performed using the standard protocol following bolus administration of intravenous contrast.  CONTRAST: 69mL OMNIPAQUE IOHEXOL 300 MG/ML SOLN  COMPARISON: 11/04/2013  FINDINGS: Lower chest: Lung bases are clear.  Pacemaker leads, incompletely visualized.  Hepatobiliary: Liver is within normal limits.  Gallbladder is unremarkable. No intrahepatic or extrahepatic ductal dilatation.  Pancreas: Within normal limits.  Spleen: Within normal limits.  Adrenals/Urinary Tract: Adrenal glands are unremarkable.  3.2 x 2.6 cm medial right upper pole renal  cyst (series 2/ image 29). Tiny left renal cyst measuring up to 7 mm (series 3/image 18). No hydronephrosis.  Stomach/Bowel: Stomach is unremarkable.  No evidence of bowel obstruction.  Normal appendix (coronal image 45).  Vascular/Lymphatic: Atherosclerotic calcifications of the abdominal aorta and branch vessels.  No suspicious abdominopelvic lymphadenopathy.  Reproductive: Status post hysterectomy. No adnexal masses.  Other: No suspicious abdominopelvic lymphadenopathy.  Musculoskeletal: Degenerative changes of the visualized thoracolumbar spine.  9 mm lytic lesion involving the right lateral 10th rib (series 2/image 10) with associated pathologic fracture (series 2/image 12).  2.4 x 3.5 cm soft tissue lesion with associated destruction of the right posterior 12th rib (series 2/ image 23), new from prior CT.  IMPRESSION: No evidence of bowel obstruction. Normal appendix.  No CT findings to account for the patient's diarrhea/ constipation.  2.5 cm destructive soft tissue lesion involving the right posterior 12th rib. Additional 9 mm lytic lesion with pathologic  fracture involving the right lateral 10th rib. These findings are new from prior CT and are suspicious for myeloma or metastases   PATHOLOGY FINAL for KENESHIA, TENA ([id number]) Patient: MARALEE, HIGUCHI A Collected: 06/24/2014 Client: [facility] Accession: [id number] Received: 06/24/2014 Art Hoss DOB: [birth year] Age: 89 Gender: F Reported: 06/26/2014 [address] Patient Ph: [phone] MRN #: [id number] [place], [place] [address] Visit #: [id number].[email or web address] Chart #: Phone: [phone] Fax: CC: Evlyn Clines, MD REPORT OF SURGICAL PATHOLOGY FINAL DIAGNOSIS Diagnosis Bone, biopsy, R 11th rib METASTATIC LEIOMYOSARCOMA. Microscopic Comment Confirmatory immunostains for SMA and desmin were performed and the malignant spindle cells are strongly positive for SMA and desmin. The findings confirm the above interpretation. (HCL:gt, 06/25/14) Gretel Acre LI MD Pathologist, Electronic Signature (Case signed 06/26/2014) Specimen Gross and Clinical Information Specimen(s) Obtained: Bone, biopsy, R 11th rib Specimen Clinical Information (kp) Gross Received in formalin are two cores of gray-white soft tissue, each 1.4 x 0.1 cm, in toto one block. (SSW:ecj 06/24/2014) Stain(s) used in Diagnosis: The following stain(s) were used in diagnosing the case: Desmin, (a-SMA) Smooth Muscle Actin. The control(s) stained appropriately.    ECHOCARDIOGRAM 11-12-13 Transthoracic Echocardiography  Patient:  Yoceline, Bazar MR #:    [id number] Study Date: 11/12/2013 Gender:   F Age:    59 Height:   160cm Weight:   78kg BSA:    1.31m^2 Pt. Status: Room:  ATTENDING  Jenkins Rouge SONOGRAPHER Cindy Hazy, RDCS ORDERING   Virl Axe [facility] PERFORMING  [facility], Outpatient cc:  ------------------------------------------------------------ LV EF: 55% -   60%  ------------------------------------------------------------ Indications:   Murmur 785.2.  ------------------------------------------------------------ History:  PMH: Acquired from the patient and from the patient's chart. PMH: Atrial Flutter/Tachycardia. COPD. Hypothyroidism. Degenerative Joint Disease. Risk factors: Hypertension. Dyslipidemia.  ------------------------------------------------------------ Study Conclusions  - Left ventricle: The cavity size was normal. Wall thickness was increased in a pattern of moderate LVH. Systolic function was normal. The estimated ejection fraction was in the range of 55% to 60%. - Aortic valve: There was mild stenosis. - Mitral valve: Calcified annulus. Mildly thickened leaflets . Mild regurgitation. - Left atrium: The atrium was moderately dilated. - Atrial septum: No defect or patent foramen ovale was identified. - Pulmonary arteries: PA peak pressure: 78mm Hg (S). Transthoracic echocardiography. M-mode, complete 2D, spectral Doppler, and color Doppler. Height: Height: 160cm. Height: 63in. Weight: Weight: 78kg. Weight: 171.6lb. Body mass index: BMI: 30.5kg/m^2. Body surface area:  BSA: 1.42m^2. Blood pressure:   106/69. Patient status: Outpatient. Location: [facility]  ------------------------------------------------------------  ------------------------------------------------------------  Left ventricle: The cavity size was normal. Wall thickness was increased in a pattern of moderate LVH. Systolic function was normal. The estimated ejection fraction was in the range of 55% to 60%.  ------------------------------------------------------------ Aortic valve:  Moderately calcified leaflets. Doppler: There was mild stenosis.   VTI ratio of LVOT to aortic valve: 0.61. Valve area: 1.91cm^2(VTI). Indexed valve area: 1.06cm^2/m^2 (VTI). Peak velocity ratio of LVOT to aortic valve:  0.58. Valve area: 1.81cm^2 (Vmax). Indexed valve area: 1cm^2/m^2 (Vmax).  Mean gradient: 58mm Hg (S). Peak gradient: 79mm Hg (S).  ------------------------------------------------------------ Mitral valve:  Calcified annulus. Mildly thickened leaflets . Doppler:  Mild regurgitation.  Peak gradient: 63mm Hg (D).  ------------------------------------------------------------ Left atrium: The atrium was moderately dilated.  ------------------------------------------------------------ Atrial septum: No defect or patent foramen ovale was identified.  ------------------------------------------------------------ Right ventricle: The cavity size was normal. Wall thickness was normal. Pacer wire or catheter noted in right ventricle. Systolic function was normal.  ------------------------------------------------------------ Pulmonic valve:  Structurally normal valve.  Cusp separation was normal. Doppler: Transvalvular velocity was within the normal range. Mild regurgitation.  ------------------------------------------------------------ Tricuspid valve:  Structurally normal valve.  Leaflet separation was normal. Doppler: Transvalvular velocity was within the normal range. Mild regurgitation.  ------------------------------------------------------------ Right atrium: The atrium was normal in size.  ------------------------------------------------------------ Pericardium: The pericardium was normal in appearance.  ------------------------------------------------------------    Medications: I have reviewed the patient's current medications. She resumed pradaxa after biopsy.  DISCUSSION: All of above information discussed with patient and family now. She does not seem symptomatic from the rib involvement. We are all in agreement with referral to tertiary care sarcoma specialist, specifically to [facility] as she will be relocating to that area in next few weeks. I have  mentioned that chemotherapy with adriamycin may be consideration (see echocardiogram from 10-2013 above, does have other cardiac diagnoses) and that Yondelis has recently been approved for use after adriamycin. Scans done to date do not show other obvious metastatic disease beyond the 2 rib areas.  Assessment/Plan:  1.Leiomyosarcoma of uterus: IA high grade at diagnosis 05-2012, with surgery at [facility] 06-15-12 and adjuvant gemzar taxotere x4 cycles from 08-06-12 thru 10-22-12. Metastatic disease to right 9th and 11th ribs found incidentally on scan 06-16-2014. Patient and family request referral to [facility] as above. 2.atrial fibrillation and cardiac pacer, on pradaxa 3.long past tobacco 4.post right knee arthroplasty 05-19-14 5.HTN, asthma, spinal stenosis, chronic bursitis hips 6.needs flu vaccine  All questions answered to best of my ability. They understand that we will make referral and be in touch with her about this.  Time spent 40 min including >50% counseling and coordination of care  LIVESAY,LENNIS P, MD   06/27/2014, 5:08 PM

## 2014-06-29 ENCOUNTER — Encounter: Payer: Self-pay | Admitting: Oncology

## 2014-06-30 ENCOUNTER — Telehealth: Payer: Self-pay | Admitting: Oncology

## 2014-06-30 ENCOUNTER — Encounter: Payer: Self-pay | Admitting: Gastroenterology

## 2014-06-30 NOTE — Telephone Encounter (Signed)
Medical Oncology  MD spoke with Amy Dominguez x2, main # [phone] and new patient coordinator Amy Dominguez [phone]. List of all information needed to be faxed given to RN, fax # [phone] Amy Dominguez. Patient to be set up in sarcoma clinic, likely with medical oncology in that department. MD spoke with Amy Dominguez radiology, discs of CTs 10-2013 and 05-2014 to be ready for husband to Dominguez up 07-01-14 PM, for patient to hand carry to her appointment at [facility]. MD spoke with [facility] pathology and completed fax request to send slides from biopsy 06-24-14 to Dept Pathology, [facility], room [address], [address] [place] [address].  MD then spoke with patient to let her know progress with above and that nurse navigator from Amy Dominguez will call her directly with appointment. Patient appreciated call, is making progress organizing move.  Amy Pick, MD

## 2014-06-30 NOTE — Telephone Encounter (Signed)
Medical Oncology  Spoke with [place] [phone] with initial information for new patient appointment to sarcoma clinic. They will call back to my RN for rest of information and to schedule.  Godfrey Pick, MD

## 2014-07-03 ENCOUNTER — Telehealth: Payer: Self-pay

## 2014-07-03 NOTE — Telephone Encounter (Signed)
Faxed Records to Dr. Michel Harrow as request by Dr. Marko Plume. Path Sides requested from [facility] pathology from 06-24-14 rib biopsy to be sent to [facility] and CD of scans from [facility] radiology by Dr. Marko Plume on 06-30-14.

## 2014-07-07 ENCOUNTER — Telehealth: Payer: Self-pay | Admitting: Internal Medicine

## 2014-07-07 MED ORDER — LISINOPRIL 20 MG PO TABS: 20.0000 mg | ORAL_TABLET | Freq: Every day | ORAL | Status: AC

## 2014-07-07 NOTE — Telephone Encounter (Signed)
New message  Pt is about to move out of town would like her medications changed from 30 to 90 day of Lisinopril. Also pt would like a referral to a cardiologist in [facility]. [facility]

## 2014-07-07 NOTE — Telephone Encounter (Signed)
Sent 90 day refill to [facility], per pt request. Informed her that we do not place referral, she needs to inform office she is moving and needs to establish care with them and they will send for our records. Patient verbalized understanding and agreeable to plan.

## 2014-07-08 ENCOUNTER — Telehealth: Payer: Self-pay | Admitting: Internal Medicine

## 2014-07-08 NOTE — Telephone Encounter (Signed)
Pt aware records are ready for Pick up 11.10.15/km

## 2014-07-17 ENCOUNTER — Telehealth: Payer: Self-pay

## 2014-07-17 DIAGNOSIS — C55 Malignant neoplasm of uterus, part unspecified: Secondary | ICD-10-CM

## 2014-07-17 NOTE — Telephone Encounter (Signed)
Amy Dominguez called stating that she had her consultation at [name] this past week on 07-15-14.  The recommendation was to be treated with chemotherapy.   She would like to have a PAC placed here in [place] before she moves as it is more familiar to her. Sh would like a return call before 1200 on 07-18-14.

## 2014-07-18 ENCOUNTER — Telehealth: Payer: Self-pay | Admitting: Internal Medicine

## 2014-07-18 ENCOUNTER — Telehealth: Payer: Self-pay

## 2014-07-18 ENCOUNTER — Telehealth: Payer: Self-pay | Admitting: *Deleted

## 2014-07-18 NOTE — Telephone Encounter (Signed)
Mindi Slicker A >','<< Less Detail',event)" href="javascript:;">More Detail >>   Gordy Levan, MD   Sent: Thu July 17, 2014 6:39 PM    To: Baruch Merl, RN        Message     [facility] good. Order code is [phone], I think. I would put in the order, but I don't know when she wants it-  thanks    ----- Message -----     From: Baruch Merl, RN     Sent: 07/17/2014  5:00 PM      To: Lennis Marion Downer, MD        FYI, USE [facility] ?     Barbaraann Share

## 2014-07-18 NOTE — Telephone Encounter (Signed)
Per Dr Percival Spanish needs to have device checked. LMOVM for return call/kwm

## 2014-07-18 NOTE — Telephone Encounter (Signed)
Advised ok to stop Pradaxa 2 days prior to procedure, per Dr. Caryl Comes.

## 2014-07-18 NOTE — Telephone Encounter (Addendum)
Spoke with Tiffany in [employer] IR.  She will call the patient now and discuss date and time for placement.  She will call Dr. Olin Pia office to discuss holding the pradaxa 2 days prior to the procedure. Order Placed for [facility].

## 2014-07-18 NOTE — Addendum Note (Signed)
Addended by: Baruch Merl on: 07/18/2014 10:33 AM   Modules accepted: Orders

## 2014-07-18 NOTE — Telephone Encounter (Signed)
ENCOUNTER OPENED IN ERROR

## 2014-07-18 NOTE — Telephone Encounter (Signed)
New message    Need to hold pradaxa for 2 days for port placement.

## 2014-07-21 ENCOUNTER — Telehealth: Payer: Self-pay | Admitting: Cardiology

## 2014-07-21 NOTE — Telephone Encounter (Signed)
New message          Pt would like for kristen to give her a call

## 2014-07-21 NOTE — Telephone Encounter (Signed)
Pt was out of town last week and on 07-15-2014 she had an episode where her heart rate would not decrease. Dr. Percival Spanish wants pt to be seen in office by device clinic to have device interrogated. Pt agreed to an appt on 11-24 at 2:00 PM/

## 2014-07-22 ENCOUNTER — Ambulatory Visit (INDEPENDENT_AMBULATORY_CARE_PROVIDER_SITE_OTHER): Payer: Medicare Other | Admitting: *Deleted

## 2014-07-22 ENCOUNTER — Other Ambulatory Visit: Payer: Self-pay | Admitting: Radiology

## 2014-07-22 DIAGNOSIS — I4892 Unspecified atrial flutter: Secondary | ICD-10-CM

## 2014-07-22 LAB — MDC_IDC_ENUM_SESS_TYPE_INCLINIC
Battery Impedance: 189 Ohm
Battery Remaining Longevity: 119 mo
Battery Voltage: 2.8 V
Brady Statistic AP VP Percent: 0 %
Brady Statistic AP VS Percent: 73 %
Brady Statistic AS VP Percent: 0 %
Lead Channel Impedance Value: 401 Ohm
Lead Channel Pacing Threshold Amplitude: 0.5 V
Lead Channel Sensing Intrinsic Amplitude: 15.67 mV
Lead Channel Sensing Intrinsic Amplitude: 2 mV
Lead Channel Setting Pacing Amplitude: 2.5 V
Lead Channel Setting Pacing Pulse Width: 0.4 ms
MDC IDC MSMT LEADCHNL RA PACING THRESHOLD PULSEWIDTH: 0.4 ms
MDC IDC MSMT LEADCHNL RV IMPEDANCE VALUE: 638 Ohm
MDC IDC MSMT LEADCHNL RV PACING THRESHOLD AMPLITUDE: 0.5 V
MDC IDC MSMT LEADCHNL RV PACING THRESHOLD PULSEWIDTH: 0.4 ms
MDC IDC SESS DTM: 20151124144154
MDC IDC SET LEADCHNL RA PACING AMPLITUDE: 2 V
MDC IDC SET LEADCHNL RV SENSING SENSITIVITY: 5.6 mV
MDC IDC STAT BRADY AS VS PERCENT: 27 %

## 2014-07-22 NOTE — Progress Notes (Signed)
Pacemaker check in clinic. Normal device function. Thresholds, sensing, impedances consistent with previous measurements. Device programmed to maximize longevity. 512 mode switches the longest > 21 hours, + pradaxa on hold until 07/25/14.  60 high ventricular rates noted, A-fib with RVR and SVT. Device programmed at appropriate safety margins. Histogram distribution appropriate for patient activity level. Device programmed to optimize intrinsic conduction. Estimated longevity 10 years. Patient education completed.  Patient is moving to [place].

## 2014-07-23 ENCOUNTER — Other Ambulatory Visit: Payer: Self-pay | Admitting: *Deleted

## 2014-07-23 DIAGNOSIS — Z8542 Personal history of malignant neoplasm of other parts of uterus: Secondary | ICD-10-CM

## 2014-07-23 MED ORDER — ZOLPIDEM TARTRATE 10 MG PO TABS: 10.0000 mg | ORAL_TABLET | Freq: Every evening | ORAL | Status: AC | PRN

## 2014-07-25 ENCOUNTER — Encounter (HOSPITAL_COMMUNITY): Payer: Self-pay

## 2014-07-25 ENCOUNTER — Ambulatory Visit (HOSPITAL_COMMUNITY)
Admission: RE | Admit: 2014-07-25 | Discharge: 2014-07-25 | Disposition: A | Discharge status: Home / Self Care | Payer: Medicare Other | Source: Ambulatory Visit | Attending: Oncology | Admitting: Oncology

## 2014-07-25 ENCOUNTER — Other Ambulatory Visit: Payer: Self-pay | Admitting: Oncology

## 2014-07-25 DIAGNOSIS — C55 Malignant neoplasm of uterus, part unspecified: Secondary | ICD-10-CM

## 2014-07-25 DIAGNOSIS — Z950 Presence of cardiac pacemaker: Secondary | ICD-10-CM | POA: Diagnosis not present

## 2014-07-25 DIAGNOSIS — I10 Essential (primary) hypertension: Secondary | ICD-10-CM | POA: Diagnosis not present

## 2014-07-25 DIAGNOSIS — I4891 Unspecified atrial fibrillation: Secondary | ICD-10-CM | POA: Insufficient documentation

## 2014-07-25 DIAGNOSIS — Z96651 Presence of right artificial knee joint: Secondary | ICD-10-CM | POA: Insufficient documentation

## 2014-07-25 DIAGNOSIS — Z803 Family history of malignant neoplasm of breast: Secondary | ICD-10-CM | POA: Insufficient documentation

## 2014-07-25 DIAGNOSIS — Z87891 Personal history of nicotine dependence: Secondary | ICD-10-CM | POA: Insufficient documentation

## 2014-07-25 DIAGNOSIS — C799 Secondary malignant neoplasm of unspecified site: Secondary | ICD-10-CM | POA: Insufficient documentation

## 2014-07-25 DIAGNOSIS — C499 Malignant neoplasm of connective and soft tissue, unspecified: Secondary | ICD-10-CM | POA: Insufficient documentation

## 2014-07-25 DIAGNOSIS — Z96698 Presence of other orthopedic joint implants: Secondary | ICD-10-CM | POA: Insufficient documentation

## 2014-07-25 DIAGNOSIS — J45909 Unspecified asthma, uncomplicated: Secondary | ICD-10-CM | POA: Insufficient documentation

## 2014-07-25 LAB — PROTIME-INR
INR: 1.03 (ref 0.00–1.49)
PROTHROMBIN TIME: 13.7 s (ref 11.6–15.2)

## 2014-07-25 LAB — CBC WITH DIFFERENTIAL/PLATELET
Basophils Absolute: 0 10*3/uL (ref 0.0–0.1)
Basophils Relative: 1 % (ref 0–1)
EOS ABS: 0.1 10*3/uL (ref 0.0–0.7)
Eosinophils Relative: 1 % (ref 0–5)
HCT: 37.6 % (ref 36.0–46.0)
Hemoglobin: 12.1 g/dL (ref 12.0–15.0)
LYMPHS ABS: 2.1 10*3/uL (ref 0.7–4.0)
LYMPHS PCT: 24 % (ref 12–46)
MCH: 28 pg (ref 26.0–34.0)
MCHC: 32.2 g/dL (ref 30.0–36.0)
MCV: 87 fL (ref 78.0–100.0)
Monocytes Absolute: 0.7 10*3/uL (ref 0.1–1.0)
Monocytes Relative: 8 % (ref 3–12)
NEUTROS ABS: 5.8 10*3/uL (ref 1.7–7.7)
NEUTROS PCT: 66 % (ref 43–77)
PLATELETS: 162 10*3/uL (ref 150–400)
RBC: 4.32 MIL/uL (ref 3.87–5.11)
RDW: 14.2 % (ref 11.5–15.5)
WBC: 8.8 10*3/uL (ref 4.0–10.5)

## 2014-07-25 MED ORDER — LIDOCAINE-EPINEPHRINE 2 %-1:100000 IJ SOLN
INTRAMUSCULAR | Status: AC
  Filled 2014-07-25: qty 1

## 2014-07-25 MED ORDER — CEFAZOLIN SODIUM-DEXTROSE 2-3 GM-% IV SOLR
2.0000 g | Freq: Once | INTRAVENOUS | Status: AC
  Administered 2014-07-25: 2 g via INTRAVENOUS

## 2014-07-25 MED ORDER — SODIUM CHLORIDE 0.9 % IV SOLN
INTRAVENOUS | Status: DC
Start: 2014-07-25 — End: 2014-07-26
  Administered 2014-07-25: 14:00:00 via INTRAVENOUS

## 2014-07-25 MED ORDER — MIDAZOLAM HCL 2 MG/2ML IJ SOLN
INTRAMUSCULAR | Status: AC | PRN
  Administered 2014-07-25 (×2): 1 mg via INTRAVENOUS

## 2014-07-25 MED ORDER — FENTANYL CITRATE 0.05 MG/ML IJ SOLN
INTRAMUSCULAR | Status: AC | PRN
  Administered 2014-07-25 (×2): 50 ug via INTRAVENOUS

## 2014-07-25 MED ORDER — FENTANYL CITRATE 0.05 MG/ML IJ SOLN
INTRAMUSCULAR | Status: AC
  Filled 2014-07-25: qty 4

## 2014-07-25 MED ORDER — CEFAZOLIN SODIUM-DEXTROSE 2-3 GM-% IV SOLR
INTRAVENOUS | Status: AC
  Administered 2014-07-25: 2 g via INTRAVENOUS
  Filled 2014-07-25: qty 50

## 2014-07-25 MED ORDER — MIDAZOLAM HCL 2 MG/2ML IJ SOLN
INTRAMUSCULAR | Status: AC
  Filled 2014-07-25: qty 4

## 2014-07-25 MED ORDER — HEPARIN SOD (PORK) LOCK FLUSH 100 UNIT/ML IV SOLN
INTRAVENOUS | Status: AC
  Filled 2014-07-25: qty 5

## 2014-07-25 NOTE — Procedures (Signed)
Successful placement of right IJ approach port-a-cath with tip at the superior caval atrial junction. The catheter is ready for immediate use. No immediate post procedural complications. 

## 2014-07-25 NOTE — Discharge Instructions (Signed)
Conscious Sedation, Adult, Care After Refer to this sheet in the next few weeks. These instructions provide you with information on caring for yourself after your procedure. Your health care provider may also give you more specific instructions. Your treatment has been planned according to current medical practices, but problems sometimes occur. Call your health care provider if you have any problems or questions after your procedure. WHAT TO EXPECT AFTER THE PROCEDURE  After your procedure:  You may feel sleepy, clumsy, and have poor balance for several hours.  Vomiting may occur if you eat too soon after the procedure. HOME CARE INSTRUCTIONS  Do not participate in any activities where you could become injured for at least 24 hours. Do not:  Drive.  Swim.  Ride a bicycle.  Operate heavy machinery.  Cook.  Use power tools.  Climb ladders.  Work from a high place.  Do not make important decisions or sign legal documents until you are improved.  If you vomit, drink water, juice, or soup when you can drink without vomiting. Make sure you have little or no nausea before eating solid foods.  Only take over-the-counter or prescription medicines for pain, discomfort, or fever as directed by your health care provider.  Make sure you and your family fully understand everything about the medicines given to you, including what side effects may occur.  You should not drink alcohol, take sleeping pills, or take medicines that cause drowsiness for at least 24 hours.  If you smoke, do not smoke without supervision.  If you are feeling better, you may resume normal activities 24 hours after you were sedated.  Keep all appointments with your health care provider. SEEK MEDICAL CARE IF:  Your skin is pale or bluish in color.  You continue to feel nauseous or vomit.  Your pain is getting worse and is not helped by medicine.  You have bleeding or swelling.  You are still sleepy or  feeling clumsy after 24 hours. SEEK IMMEDIATE MEDICAL CARE IF:  You develop a rash.  You have difficulty breathing.  You develop any type of allergic problem.  You have a fever. MAKE SURE YOU:  Understand these instructions.  Will watch your condition.  Will get help right away if you are not doing well or get worse. Document Released: 06/05/2013 Document Reviewed: 06/05/2013 [facility] Patient Information 2015 [place], [place]. This information is not intended to replace advice given to you by your health care provider. Make sure you discuss any questions you have with your health care provider.    Notify your physician for any symptoms of infection at port a cath site: pain, redness, elevated temperature.

## 2014-07-25 NOTE — H&P (Signed)
Chief Complaint: "I'm getting a port a cath"  Referring Physician(s): Livesay,Lennis P  History of Present Illness: Amy Dominguez is a 78 y.o. female with history of metastatic leiomyosarcoma who presents today for port a cath placement for chemotherapy.   Past Medical History  Diagnosis Date  . Spinal stenosis     spinal stenosis LS spine  . Hypertension   . Asthma 2008 , 2012    RAD after BRONCHITIS    . Headache(784.0)     allergy related  . Symptomatic bradycardia 2011    Insertion of Medtronic Pacer  . 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 Arthritis     hands, feet, hips, knees  . Dysrhythmia     atrial fib-Medtronic Pacemaker-on pradaxa-off for surgery since 9.25 2013  . Complication of anesthesia     slow to wake up before having pacemaker  . Pacemaker     Past Surgical History  Procedure Laterality Date  . Polypectomy  2008     negative 2011,Dr Sharlett Iles  . Hemorrhoid surgery    . Tonsillectomy    . Tubal ligation    . Insert / replace / remove pacemaker  [name] pacemaker  09/2009  . Foot surgery      JOINT REPLACEMENT LT GREAT TOE   20 YRS AGO   . Cataract extraction      OS  . Lumbar laminectomy/decompression microdiscectomy  09/21/2011    Procedure: LUMBAR LAMINECTOMY/DECOMPRESSION MICRODISCECTOMY;  Surgeon: Eustace Moore, MD;  Location: [facility];  Service: Neurosurgery;  Laterality: N/A;  lumbar laminectomy lumbar four-lumbar five  . Hysteroscopy w/d&c  05/29/2012    Procedure: DILATATION AND CURETTAGE /HYSTEROSCOPY;  Surgeon: Azalia Bilis, MD;  Location: [facility];  Service: Gynecology;  Laterality: N/A;  W/ Resectascope  . Dilation and evacuation  05/29/2012    Procedure: DILATATION AND EVACUATION;  Surgeon: Azalia Bilis, MD;  Location: [facility];  Service: Gynecology;  Laterality: N/A;  . Abdominal hysterectomy    . Back surgery    . Total knee arthroplasty Right 05/19/2014    Procedure: RIGHT TOTAL KNEE ARTHROPLASTY;   Surgeon: Gearlean Alf, MD;  Location: [facility];  Service: Orthopedics;  Laterality: Right;    Allergies: Tylenol  Medications: Prior to Admission medications   Medication Sig Start Date End Date Taking? Authorizing Provider  ALPRAZolam Duanne Moron) 0.25 MG tablet Take 1 tablet every 8-12 hrs prn anxiety 06/19/14   Lennis Marion Downer, MD  carisoprodol (SOMA) 350 MG tablet Take 350 mg by mouth 4 (four) times daily as needed for muscle spasms.    Historical Provider, MD  cetirizine (ZYRTEC) 10 MG tablet Take 10 mg by mouth at bedtime.    Historical Provider, MD  ibuprofen (ADVIL,MOTRIN) 200 MG tablet Take 400 mg by mouth every 6 (six) hours as needed for mild pain or moderate pain.    Historical Provider, MD  lisinopril (PRINIVIL,ZESTRIL) 20 MG tablet Take 1 tablet (20 mg total) by mouth at bedtime. 07/07/14   Deboraha Sprang, MD  metoprolol tartrate (LOPRESSOR) 25 MG tablet Take 12.5 mg by mouth 2 (two) times daily.    Historical Provider, MD  Multiple Vitamin (MULTIVITAMIN WITH MINERALS) TABS tablet Take 1 tablet by mouth every morning.    Historical Provider, MD  potassium chloride SA (K-DUR,KLOR-CON) 20 MEQ tablet Take 20 mEq by mouth every morning.    Historical Provider, MD  PRADAXA 150 MG CAPS capsule Take  1 capsule by mouth 2 (two) times daily. 05/10/14   Historical Provider, MD  torsemide (DEMADEX) 20 MG tablet Take 20 mg by mouth every morning.    Historical Provider, MD  VITAMIN E PO Take 1 tablet by mouth every morning.    Historical Provider, MD  zolpidem (AMBIEN) 10 MG tablet Take 1 tablet (10 mg total) by mouth at bedtime as needed for sleep. 07/23/14   Lennis Marion Downer, MD    Family History  Problem Relation Age of Onset  . Stroke Mother 63    post valve replacement; warfarin missed  . Heart attack Father 53  . Breast cancer Sister   . Deep vein thrombosis Daughter     Factor S non functional  . Diabetes Neg Hx     History   Social History  . Marital Status: Married     Spouse Name: N/A    Number of Children: N/A  . Years of Education: N/A   Occupational History  . Retired Therapist, sports    Social History Main Topics  . Smoking status: Former Smoker -- 0.25 packs/day for 54 years    Types: Cigarettes    Quit date: 06/05/2013  . Smokeless tobacco: Never Used     Comment: smoked 1957-2014, up to < 1 ppd (off 5 years )  . Alcohol Use: 1.8 oz/week    3 Glasses of wine per week     Comment:  occasionally  . Drug Use: No  . Sexual Activity: Not Currently   Other Topics Concern  . Not on file   Social History Narrative   No added salt               Review of Systems  Constitutional: Negative for fever and chills.  HENT: Positive for sinus pressure.   Respiratory: Negative for cough and shortness of breath.   Cardiovascular: Negative for chest pain.  Gastrointestinal: Negative for nausea, vomiting, abdominal pain and blood in stool.  Genitourinary: Negative for dysuria and hematuria.  Musculoskeletal: Positive for joint swelling. Negative for back pain.       Occ rt knee pain  Neurological: Negative for headaches.  Hematological: Does not bruise/bleed easily.    Vital Signs: BP 115/58 mmHg  Pulse 64  Temp(Src) 98.1 F (36.7 C) (Oral)  Resp 16  SpO2 98%  LMP 09/14/2011  Physical Exam  Constitutional: She is oriented to person, place, and time. She appears well-developed and well-nourished.  Cardiovascular: Normal rate.   Murmur heard. Paced rhythm (left chest wall pacer)  Pulmonary/Chest: Effort normal and breath sounds normal.  Abdominal: Soft. Bowel sounds are normal. There is no tenderness.  Musculoskeletal: Normal range of motion. She exhibits no edema.  Neurological: She is alert and oriented to person, place, and time.    Imaging: No results found.  Labs:  CBC:  Recent Labs  06/04/14 0934 06/16/14 1221 06/16/14 1805 06/24/14 0725 06/27/14 1543  WBC 8.8 12.9*  --  6.7 10.8*  HGB 11.1* 13.0 13.6 10.8* 12.0  HCT 34.7*  39.6 40.0 33.6* 37.4  PLT 343 258.0  --  188 224    COAGS:  Recent Labs  01/14/14 1250 05/14/14 1035 06/24/14 0725  INR 0.96 1.01 1.12  APTT 30 29 30     BMP:  Recent Labs  05/14/14 1035 05/20/14 0425 05/21/14 0510 06/04/14 0934 06/16/14 1221 06/16/14 1805 06/27/14 1544  NA 139 137 137 141 135 133* 140  K 4.3 4.0 4.3 3.6 3.9 4.0 3.7  CL  101 100 99  --  96 98  --   CO2 27 27 27 28 26   --  27  GLUCOSE 95 146* 160* 121 110* 104* 140  BUN 19 19 18  16.0 21 26* 15.3  CALCIUM 9.9 8.3* 8.8 10.0 9.6  --  9.6  CREATININE 0.84 0.78 0.69 0.8 1.5* 1.50* 0.8  GFRNONAA 66* 79* 82*  --   --   --   --   GFRAA 76* >90 >90  --   --   --   --     LIVER FUNCTION TESTS:  Recent Labs  05/14/14 1035 06/04/14 0934 06/16/14 1221 06/27/14 1544  BILITOT 0.5 0.93 1.0 0.59  AST 18 16 27 16   ALT 6 6 10 7   ALKPHOS 82 84 97 91  PROT 7.2 6.8 7.5 6.7  ALBUMIN 4.1 3.6 3.9 3.8    TUMOR MARKERS: No results for input(s): AFPTM, CEA, CA199, CHROMGRNA in the last 8760 hours.  Assessment and Plan: Amy Dominguez is a 78 y.o. female with history of metastatic leiomyosarcoma who presents today for port a cath placement for chemotherapy. Details/risks of procedure d/w pt/husband with their understanding and consent.        Signed: Autumn Messing 07/25/2014, 1:22 PM

## 2014-08-18 ENCOUNTER — Encounter: Payer: Medicare Other | Admitting: *Deleted

## 2014-08-18 ENCOUNTER — Telehealth: Payer: Self-pay | Admitting: Cardiology

## 2014-08-18 NOTE — Telephone Encounter (Signed)
Pt said that she has moved to [place]. Pt will have a new MD to follow pacemaker there. Pt had PPM checked by Dr. Percival Spanish 2-3 weeks ago.

## 2014-08-19 ENCOUNTER — Encounter: Payer: Self-pay | Admitting: Cardiology

## 2014-08-27 ENCOUNTER — Encounter: Payer: Self-pay | Admitting: Cardiology

## 2014-09-22 ENCOUNTER — Telehealth: Payer: Self-pay | Admitting: Internal Medicine

## 2014-09-22 NOTE — Telephone Encounter (Signed)
New Message       Pt calling stating she no longer needs to received letters from Dr. Caryl Comes or the device clinic due to the fact that she has moved to [place] and will no longer be living in [place]. Pt states she will have a new cardiologist soon.

## 2014-09-22 NOTE — Telephone Encounter (Signed)
Noted in [place]. I attempted to call pt back but home # disconnected. Mobile # works. Pt now in [place], [place]. Pt has plans to establish w/ local EP.

## 2014-11-10 ENCOUNTER — Telehealth: Payer: Self-pay

## 2014-11-10 NOTE — Telephone Encounter (Signed)
error 

## 2014-11-26 ENCOUNTER — Other Ambulatory Visit: Payer: Self-pay | Admitting: Internal Medicine

## 2014-11-27 NOTE — Telephone Encounter (Signed)
OK to refill this medication for 3 months for patient. She has moved out of state and is looking to establish with another physician there.

## 2014-11-27 NOTE — Telephone Encounter (Signed)
Please advise on refill. I do not see where Dr Caryl Comes has ever filled this for the patient. Thanks, MI

## 2014-12-11 ENCOUNTER — Other Ambulatory Visit: Payer: Self-pay | Admitting: Internal Medicine

## 2014-12-11 NOTE — Telephone Encounter (Signed)
Please advise on refill. Thanks, MI 

## 2014-12-11 NOTE — Telephone Encounter (Signed)
Ok to give patient a couple of refills -- but she will need to establish with a new cardiologist/electrophysiologist to obtain future refills - please make patient aware.

## 2015-01-19 ENCOUNTER — Encounter: Payer: Self-pay | Admitting: *Deleted

## 2015-02-11 ENCOUNTER — Other Ambulatory Visit: Payer: Self-pay | Admitting: Internal Medicine

## 2015-02-13 NOTE — Telephone Encounter (Signed)
OK to refill for 2-3 months.  Patient will need to establish with provider for further refills.

## 2016-01-08 IMAGING — CT CT CHEST W/O CM
3 of 5 series · 16 of 36 positions shown, 18 images · non-contrast
Comparison: Partial comparison to CT abdomen pelvis dated
06/16/2014. CT chest abdomen pelvis dated 11/04/2013

ADDENDUM:
Addendum for further clarification.

The posterior right rib lesions are unchanged from recent CT
abdomen/pelvis but new from prior [REDACTED] CT chest.
Additionally, the numbering on the current CT chest study (9th and
11th ribs) are correct, as this was numbered starting at T1. The
patient only has vestigial ribs at T12, accounting for the numbering
discrepancy on recent CT abdomen/pelvis.
CLINICAL DATA: Uterine leiomyosarcoma diagnosed 2124, pathologic
rib fractures suspected on prior CT abdomen pelvis
EXAM:
CT CHEST WITHOUT CONTRAST
TECHNIQUE: Multidetector CT imaging of the chest was performed following the
standard protocol without IV contrast..

[Series 2: chest w/o st · axial · non-contrast · 0.70mm/px · z∈[-286,-61]mm · 6 of 65 slices shown, 8 images]
[im 10/65  mediastinal]
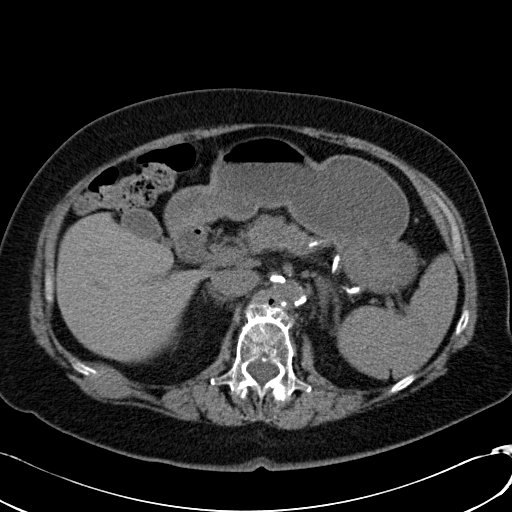
[im 10/65  lung]
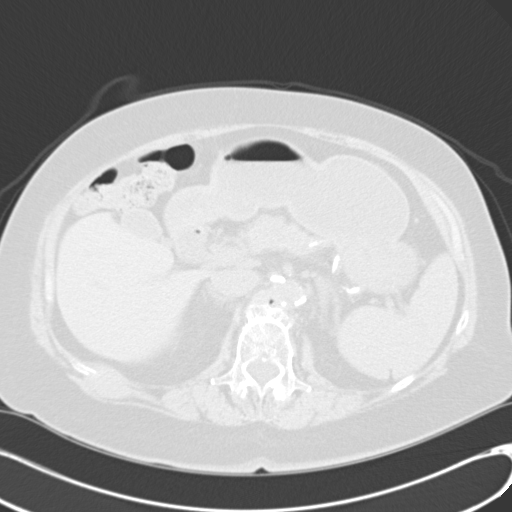
[im 19/65  lung]
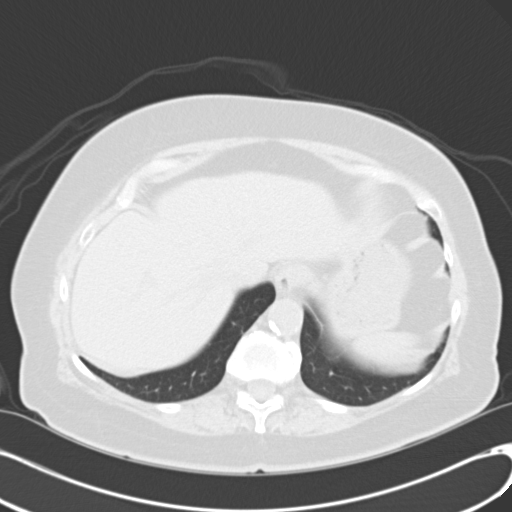
[im 28/65  lung]
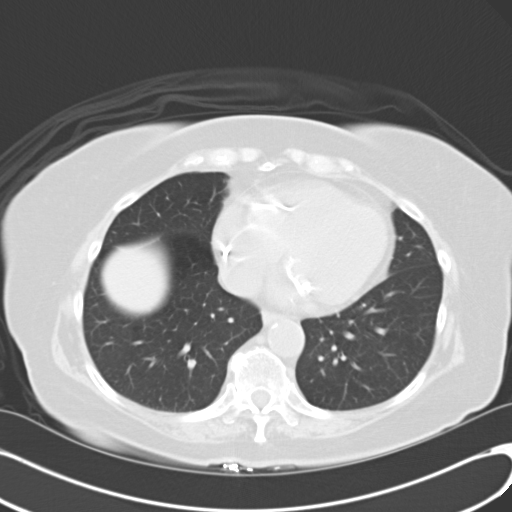
[im 37/65  lung]
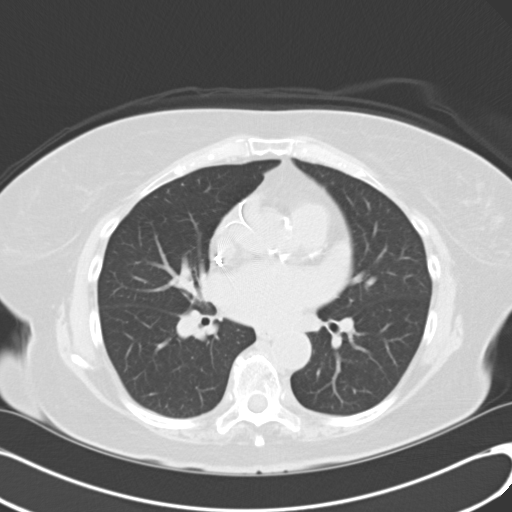
[im 46/65  mediastinal]
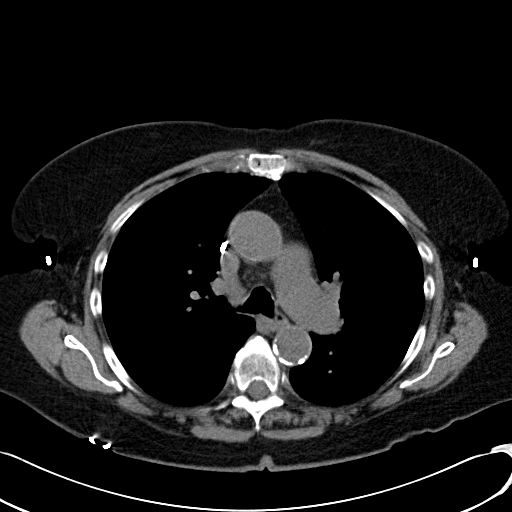
[im 46/65  lung]
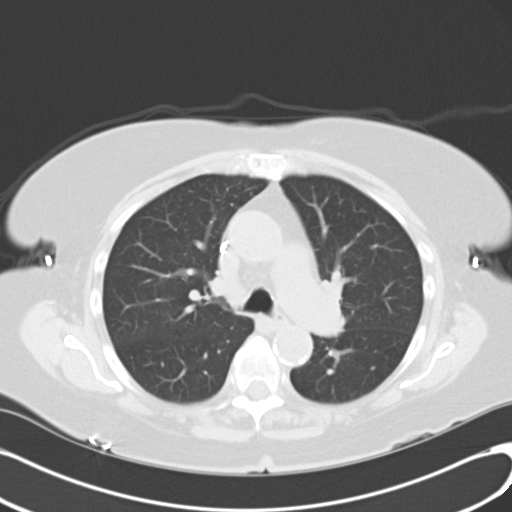
[im 55/65  lung]
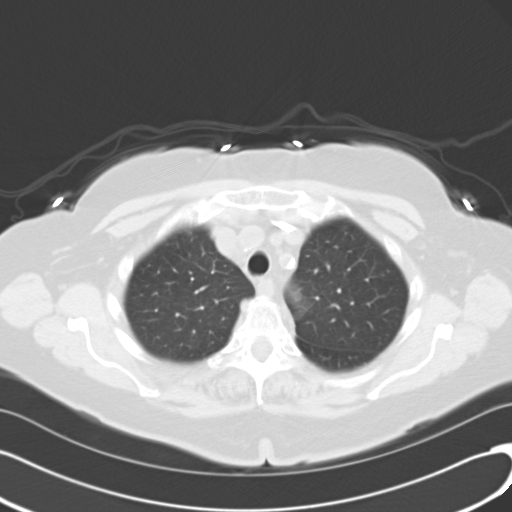

[Series 7: (person_name) recon · axial · 0.70mm/px · z∈[-290,-96]mm · 7 of 99 slices shown]
[im 9/99  lung]
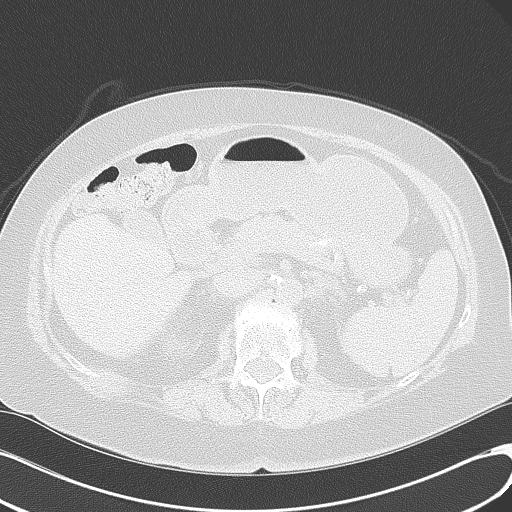
[im 25/99  lung]
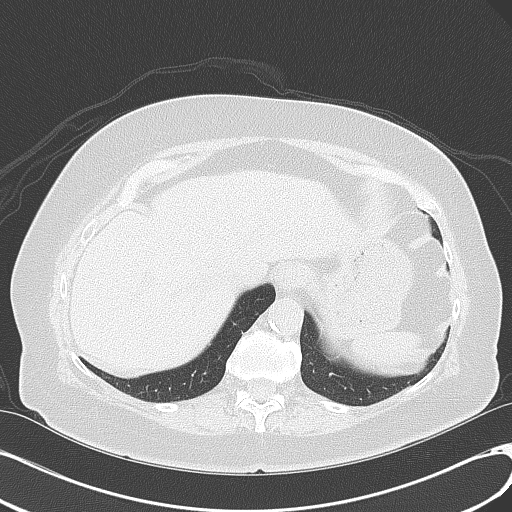
[im 33/99  lung]
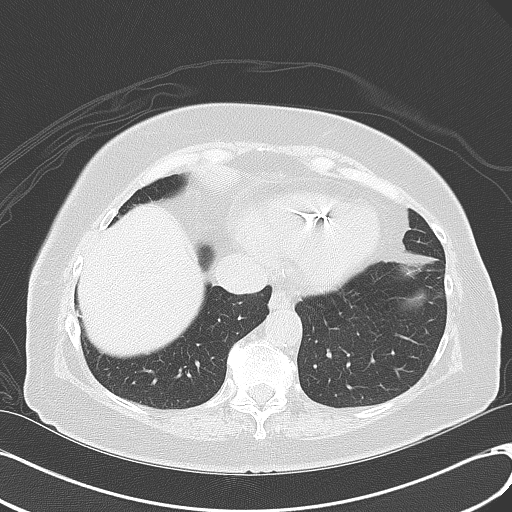
[im 41/99  lung]
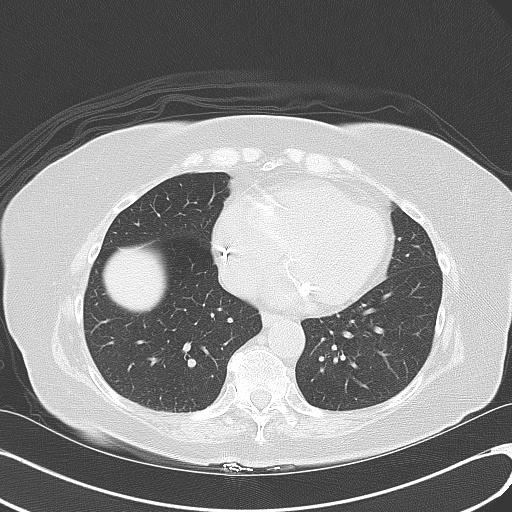
[im 58/99  lung]
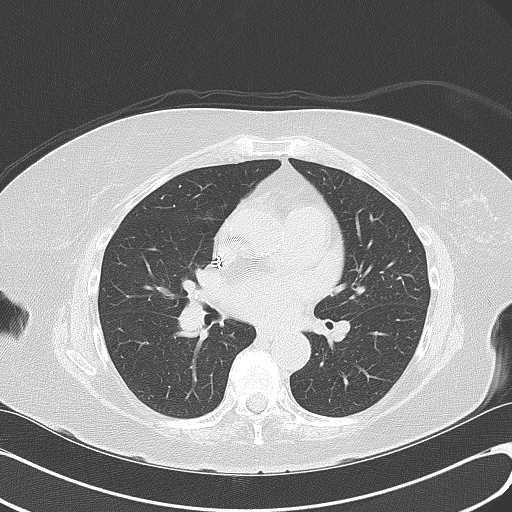
[im 66/99  lung]
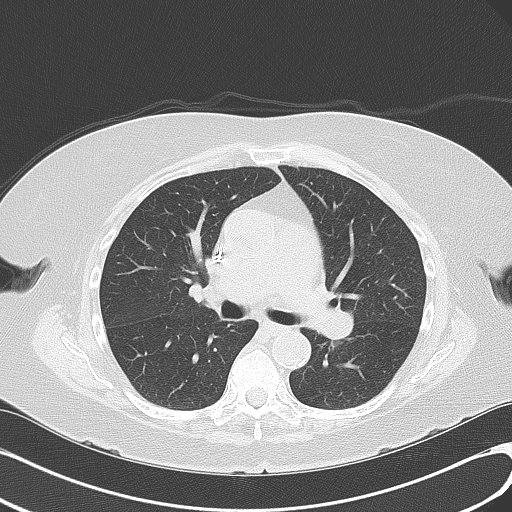
[im 74/99  lung]
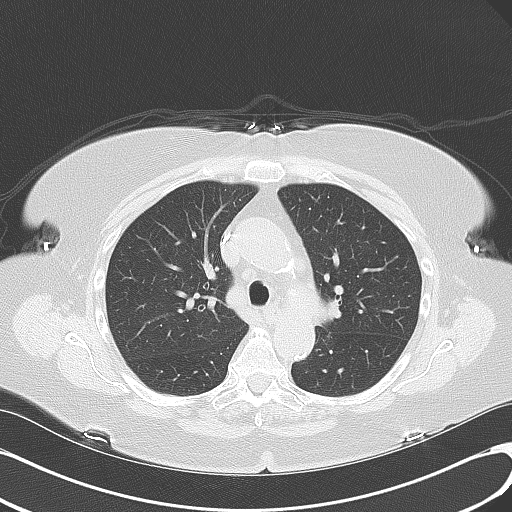

[Series 602: <mpr thick range> · coronal · 0.70mm/px · 3 of 73 slices shown]
[im 15/73  lung]
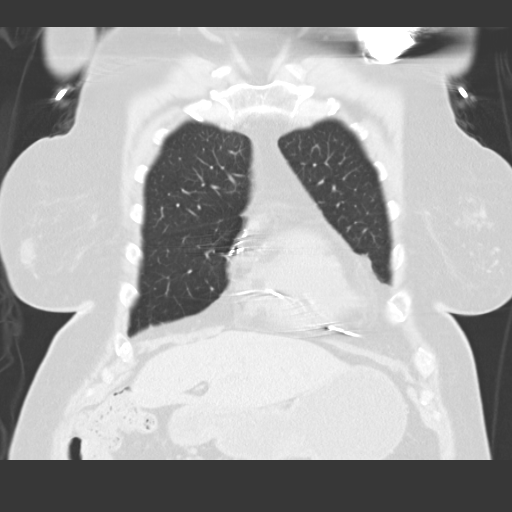
[im 29/73  lung]
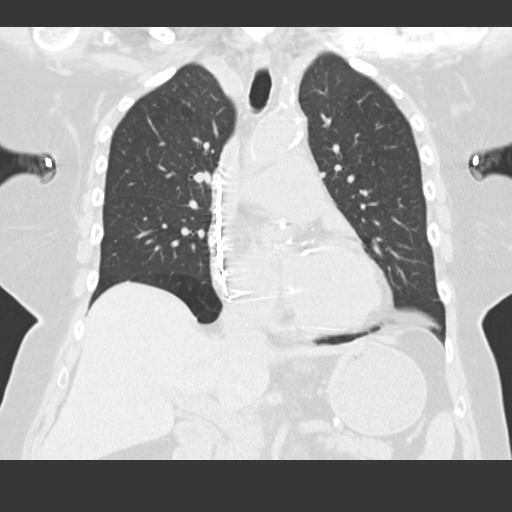
[im 44/73  lung]
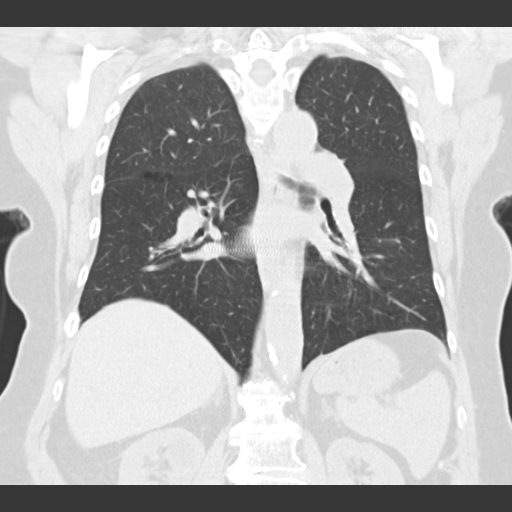

[16 of 36 positions shown; findings below may reference images not displayed]

FINDINGS: 5 mm left lower lobe pulmonary nodule (series 5/ image 39),
unchanged. Adjacent 2 mm nodule in the left lower lobe (series 11/
image 40). Two additional 1-2 mm subpleural nodules in the left
upper lobe (series 5/images 8-9) unchanged. 2-3 mm calcified
granuloma in the left lung apex (series 5/image 7), benign.

No new/suspicious pulmonary nodules.

Visualized thyroid is unremarkable.

The heart is top-normal in size. No pericardial effusion. Coronary
atherosclerosis. Atherosclerotic calcifications of the aortic arch.

Small mediastinal lymph nodes measuring up to 8 mm short axis
(series 2/image 18), which does not meet pathologic CT size
criteria. No suspicious axillary lymphadenopathy.

Left subclavian pacemaker.

Visualized upper abdomen is unchanged from recent CT.

[DATE] x 3.2 cm soft tissue mass with destruction of the right
posterior 11th rib (series 2/image 55). 10 mm lytic lesion with
adjacent pathologic fracture involving in the right lateral 9th rib
(series 7/images 64 and 68).

Mild degenerative changes of the visualized thoracolumbar spine.
IMPRESSION: Stable 3.2 cm soft tissue lesion involving the right posterior 11th
rib, suspicious for metastasis versus myeloma.

Additional lytic lesion with pathologic fracture involving the right
lateral 9th rib, suspicious for metastasis versus myeloma.

Stable left upper and lower lobe pulmonary nodules, unchanged,
favored to be benign.

## 2017-04-29 DEATH — deceased

## 2022-04-29 DEATH — deceased
